# Patient Record
Sex: Female | Born: 1935 | Race: Black or African American | Hispanic: No | Smoking: Never smoker
Health system: Southern US, Community
[De-identification: ages and names within clinical notes are randomized; demographics above are authoritative.]

## PROBLEM LIST (undated history)

## (undated) DIAGNOSIS — C50911 Malignant neoplasm of unspecified site of right female breast: Secondary | ICD-10-CM

## (undated) DIAGNOSIS — I429 Cardiomyopathy, unspecified: Secondary | ICD-10-CM

## (undated) DIAGNOSIS — I10 Essential (primary) hypertension: Secondary | ICD-10-CM

## (undated) DIAGNOSIS — R0602 Shortness of breath: Secondary | ICD-10-CM

## (undated) DIAGNOSIS — E785 Hyperlipidemia, unspecified: Secondary | ICD-10-CM

## (undated) DIAGNOSIS — K5903 Drug induced constipation: Secondary | ICD-10-CM

## (undated) DIAGNOSIS — G4733 Obstructive sleep apnea (adult) (pediatric): Secondary | ICD-10-CM

## (undated) DIAGNOSIS — Z9989 Dependence on other enabling machines and devices: Secondary | ICD-10-CM

## (undated) DIAGNOSIS — K219 Gastro-esophageal reflux disease without esophagitis: Secondary | ICD-10-CM

## (undated) DIAGNOSIS — I509 Heart failure, unspecified: Secondary | ICD-10-CM

## (undated) DIAGNOSIS — H2702 Aphakia, left eye: Secondary | ICD-10-CM

## (undated) DIAGNOSIS — I2510 Atherosclerotic heart disease of native coronary artery without angina pectoris: Secondary | ICD-10-CM

## (undated) DIAGNOSIS — K5730 Diverticulosis of large intestine without perforation or abscess without bleeding: Secondary | ICD-10-CM

## (undated) DIAGNOSIS — E119 Type 2 diabetes mellitus without complications: Secondary | ICD-10-CM

## (undated) DIAGNOSIS — M1990 Unspecified osteoarthritis, unspecified site: Secondary | ICD-10-CM

## (undated) DIAGNOSIS — G8929 Other chronic pain: Secondary | ICD-10-CM

## (undated) DIAGNOSIS — K317 Polyp of stomach and duodenum: Secondary | ICD-10-CM

## (undated) DIAGNOSIS — R519 Headache, unspecified: Secondary | ICD-10-CM

## (undated) DIAGNOSIS — R079 Chest pain, unspecified: Secondary | ICD-10-CM

## (undated) DIAGNOSIS — N200 Calculus of kidney: Secondary | ICD-10-CM

## (undated) DIAGNOSIS — E669 Obesity, unspecified: Secondary | ICD-10-CM

## (undated) DIAGNOSIS — Z8719 Personal history of other diseases of the digestive system: Secondary | ICD-10-CM

## (undated) DIAGNOSIS — J42 Unspecified chronic bronchitis: Secondary | ICD-10-CM

## (undated) DIAGNOSIS — H269 Unspecified cataract: Secondary | ICD-10-CM

## (undated) DIAGNOSIS — D509 Iron deficiency anemia, unspecified: Secondary | ICD-10-CM

## (undated) DIAGNOSIS — F419 Anxiety disorder, unspecified: Secondary | ICD-10-CM

## (undated) DIAGNOSIS — I219 Acute myocardial infarction, unspecified: Secondary | ICD-10-CM

## (undated) DIAGNOSIS — I4891 Unspecified atrial fibrillation: Secondary | ICD-10-CM

## (undated) DIAGNOSIS — M549 Dorsalgia, unspecified: Secondary | ICD-10-CM

## (undated) DIAGNOSIS — R001 Bradycardia, unspecified: Secondary | ICD-10-CM

## (undated) DIAGNOSIS — F0390 Unspecified dementia without behavioral disturbance: Secondary | ICD-10-CM

## (undated) DIAGNOSIS — R51 Headache: Secondary | ICD-10-CM

## (undated) HISTORY — PX: DILATION AND CURETTAGE OF UTERUS: SHX78

## (undated) HISTORY — PX: BREAST BIOPSY: SHX20

## (undated) HISTORY — DX: Unspecified osteoarthritis, unspecified site: M19.90

## (undated) HISTORY — DX: Obesity, unspecified: E66.9

## (undated) HISTORY — DX: Acute myocardial infarction, unspecified: I21.9

## (undated) HISTORY — DX: Aphakia, left eye: H27.02

## (undated) HISTORY — PX: TOTAL ABDOMINAL HYSTERECTOMY: SHX209

## (undated) HISTORY — DX: Calculus of kidney: N20.0

## (undated) HISTORY — PX: EUS: SHX5427

## (undated) HISTORY — PX: COLONOSCOPY: SHX174

## (undated) HISTORY — PX: GLAUCOMA SURGERY: SHX656

## (undated) HISTORY — PX: CATARACT EXTRACTION W/ INTRAOCULAR LENS  IMPLANT, BILATERAL: SHX1307

## (undated) HISTORY — PX: TONSILLECTOMY: SUR1361

## (undated) HISTORY — PX: CORONARY ANGIOPLASTY WITH STENT PLACEMENT: SHX49

## (undated) HISTORY — PX: FRACTURE SURGERY: SHX138

## (undated) HISTORY — DX: Diverticulosis of large intestine without perforation or abscess without bleeding: K57.30

## (undated) HISTORY — PX: ESOPHAGOGASTRODUODENOSCOPY: SHX1529

## (undated) HISTORY — DX: Polyp of stomach and duodenum: K31.7

## (undated) HISTORY — DX: Personal history of other diseases of the digestive system: Z87.19

## (undated) HISTORY — DX: Cardiomyopathy, unspecified: I42.9

## (undated) HISTORY — PX: EYE SURGERY: SHX253

## (undated) HISTORY — PX: BACK SURGERY: SHX140

## (undated) HISTORY — PX: APPENDECTOMY: SHX54

## (undated) HISTORY — PX: BREAST LUMPECTOMY: SHX2

---

## 1997-09-30 ENCOUNTER — Other Ambulatory Visit: Admission: RE | Admit: 1997-09-30 | Discharge: 1997-09-30 | Payer: Self-pay | Admitting: *Deleted

## 1997-09-30 ENCOUNTER — Encounter: Admission: RE | Admit: 1997-09-30 | Discharge: 1997-09-30 | Payer: Self-pay | Admitting: Hematology and Oncology

## 1997-10-07 ENCOUNTER — Ambulatory Visit (HOSPITAL_COMMUNITY): Admission: RE | Admit: 1997-10-07 | Discharge: 1997-10-07 | Payer: Self-pay | Admitting: *Deleted

## 1997-12-10 ENCOUNTER — Emergency Department (HOSPITAL_COMMUNITY): Admission: EM | Admit: 1997-12-10 | Discharge: 1997-12-10 | Payer: Self-pay | Admitting: Emergency Medicine

## 1997-12-11 ENCOUNTER — Inpatient Hospital Stay (HOSPITAL_COMMUNITY): Admission: AD | Admit: 1997-12-11 | Discharge: 1997-12-16 | Payer: Self-pay | Admitting: *Deleted

## 1998-07-16 ENCOUNTER — Emergency Department (HOSPITAL_COMMUNITY): Admission: EM | Admit: 1998-07-16 | Discharge: 1998-07-17 | Payer: Self-pay | Admitting: Emergency Medicine

## 1998-07-16 ENCOUNTER — Encounter: Payer: Self-pay | Admitting: Emergency Medicine

## 1998-07-31 ENCOUNTER — Emergency Department (HOSPITAL_COMMUNITY): Admission: EM | Admit: 1998-07-31 | Discharge: 1998-07-31 | Payer: Self-pay | Admitting: Emergency Medicine

## 1998-07-31 ENCOUNTER — Encounter: Payer: Self-pay | Admitting: Emergency Medicine

## 1998-09-11 ENCOUNTER — Encounter: Payer: Self-pay | Admitting: *Deleted

## 1998-09-11 ENCOUNTER — Ambulatory Visit (HOSPITAL_COMMUNITY): Admission: RE | Admit: 1998-09-11 | Discharge: 1998-09-11 | Payer: Self-pay | Admitting: *Deleted

## 1998-10-27 ENCOUNTER — Ambulatory Visit (HOSPITAL_COMMUNITY): Admission: RE | Admit: 1998-10-27 | Discharge: 1998-10-27 | Payer: Self-pay | Admitting: *Deleted

## 1998-10-27 ENCOUNTER — Encounter: Payer: Self-pay | Admitting: *Deleted

## 1999-03-23 ENCOUNTER — Emergency Department (HOSPITAL_COMMUNITY): Admission: EM | Admit: 1999-03-23 | Discharge: 1999-03-23 | Payer: Self-pay | Admitting: Emergency Medicine

## 1999-07-10 ENCOUNTER — Ambulatory Visit (HOSPITAL_COMMUNITY): Admission: RE | Admit: 1999-07-10 | Discharge: 1999-07-10 | Payer: Self-pay | Admitting: *Deleted

## 2000-04-08 ENCOUNTER — Emergency Department (HOSPITAL_COMMUNITY): Admission: EM | Admit: 2000-04-08 | Discharge: 2000-04-08 | Payer: Self-pay | Admitting: Emergency Medicine

## 2000-04-08 ENCOUNTER — Encounter: Payer: Self-pay | Admitting: *Deleted

## 2000-05-13 ENCOUNTER — Emergency Department (HOSPITAL_COMMUNITY): Admission: EM | Admit: 2000-05-13 | Discharge: 2000-05-14 | Payer: Self-pay | Admitting: Emergency Medicine

## 2000-05-14 ENCOUNTER — Encounter: Payer: Self-pay | Admitting: Emergency Medicine

## 2000-05-17 ENCOUNTER — Encounter: Payer: Self-pay | Admitting: Emergency Medicine

## 2000-05-17 ENCOUNTER — Emergency Department (HOSPITAL_COMMUNITY): Admission: EM | Admit: 2000-05-17 | Discharge: 2000-05-17 | Payer: Self-pay | Admitting: Emergency Medicine

## 2000-10-12 ENCOUNTER — Encounter: Admission: RE | Admit: 2000-10-12 | Discharge: 2000-10-12 | Payer: Self-pay | Admitting: Hematology and Oncology

## 2000-12-15 ENCOUNTER — Encounter: Admission: RE | Admit: 2000-12-15 | Discharge: 2000-12-15 | Payer: Self-pay | Admitting: Internal Medicine

## 2001-06-06 ENCOUNTER — Encounter: Admission: RE | Admit: 2001-06-06 | Discharge: 2001-06-06 | Payer: Self-pay | Admitting: Urology

## 2001-06-06 ENCOUNTER — Encounter: Payer: Self-pay | Admitting: Urology

## 2001-06-22 ENCOUNTER — Emergency Department (HOSPITAL_COMMUNITY): Admission: EM | Admit: 2001-06-22 | Discharge: 2001-06-22 | Payer: Self-pay

## 2002-04-18 ENCOUNTER — Ambulatory Visit (HOSPITAL_COMMUNITY): Admission: RE | Admit: 2002-04-18 | Discharge: 2002-04-18 | Payer: Self-pay | Admitting: *Deleted

## 2002-04-22 ENCOUNTER — Emergency Department (HOSPITAL_COMMUNITY): Admission: EM | Admit: 2002-04-22 | Discharge: 2002-04-22 | Payer: Self-pay

## 2002-05-03 ENCOUNTER — Encounter: Admission: RE | Admit: 2002-05-03 | Discharge: 2002-05-03 | Payer: Self-pay | Admitting: Internal Medicine

## 2002-05-03 ENCOUNTER — Encounter: Payer: Self-pay | Admitting: Internal Medicine

## 2002-06-23 ENCOUNTER — Emergency Department (HOSPITAL_COMMUNITY): Admission: EM | Admit: 2002-06-23 | Discharge: 2002-06-23 | Payer: Self-pay | Admitting: Emergency Medicine

## 2002-08-22 ENCOUNTER — Emergency Department (HOSPITAL_COMMUNITY): Admission: EM | Admit: 2002-08-22 | Discharge: 2002-08-22 | Payer: Self-pay | Admitting: Emergency Medicine

## 2002-09-08 ENCOUNTER — Inpatient Hospital Stay (HOSPITAL_COMMUNITY): Admission: EM | Admit: 2002-09-08 | Discharge: 2002-09-12 | Payer: Self-pay | Admitting: Emergency Medicine

## 2002-09-08 ENCOUNTER — Encounter: Payer: Self-pay | Admitting: Emergency Medicine

## 2002-11-14 ENCOUNTER — Inpatient Hospital Stay (HOSPITAL_COMMUNITY): Admission: RE | Admit: 2002-11-14 | Discharge: 2002-11-17 | Payer: Self-pay | Admitting: Internal Medicine

## 2002-11-14 ENCOUNTER — Encounter: Admission: RE | Admit: 2002-11-14 | Discharge: 2002-11-14 | Payer: Self-pay | Admitting: Internal Medicine

## 2002-11-15 ENCOUNTER — Encounter: Payer: Self-pay | Admitting: Cardiology

## 2002-11-25 ENCOUNTER — Encounter: Payer: Self-pay | Admitting: Emergency Medicine

## 2002-11-25 ENCOUNTER — Emergency Department (HOSPITAL_COMMUNITY): Admission: EM | Admit: 2002-11-25 | Discharge: 2002-11-25 | Payer: Self-pay | Admitting: Emergency Medicine

## 2002-12-15 ENCOUNTER — Emergency Department (HOSPITAL_COMMUNITY): Admission: EM | Admit: 2002-12-15 | Discharge: 2002-12-15 | Payer: Self-pay

## 2002-12-31 ENCOUNTER — Emergency Department (HOSPITAL_COMMUNITY): Admission: EM | Admit: 2002-12-31 | Discharge: 2002-12-31 | Payer: Self-pay | Admitting: Emergency Medicine

## 2003-04-01 ENCOUNTER — Encounter: Payer: Self-pay | Admitting: Emergency Medicine

## 2003-04-01 ENCOUNTER — Emergency Department (HOSPITAL_COMMUNITY): Admission: EM | Admit: 2003-04-01 | Discharge: 2003-04-01 | Payer: Self-pay | Admitting: Emergency Medicine

## 2003-04-19 ENCOUNTER — Emergency Department (HOSPITAL_COMMUNITY): Admission: EM | Admit: 2003-04-19 | Discharge: 2003-04-19 | Payer: Self-pay | Admitting: Emergency Medicine

## 2003-04-19 ENCOUNTER — Encounter: Payer: Self-pay | Admitting: Emergency Medicine

## 2003-06-01 ENCOUNTER — Emergency Department (HOSPITAL_COMMUNITY): Admission: EM | Admit: 2003-06-01 | Discharge: 2003-06-01 | Payer: Self-pay | Admitting: Emergency Medicine

## 2003-07-12 ENCOUNTER — Encounter (INDEPENDENT_AMBULATORY_CARE_PROVIDER_SITE_OTHER): Payer: Self-pay | Admitting: *Deleted

## 2003-07-12 ENCOUNTER — Ambulatory Visit (HOSPITAL_COMMUNITY): Admission: RE | Admit: 2003-07-12 | Discharge: 2003-07-12 | Payer: Self-pay | Admitting: *Deleted

## 2003-07-15 ENCOUNTER — Encounter: Admission: RE | Admit: 2003-07-15 | Discharge: 2003-07-15 | Payer: Self-pay | Admitting: Internal Medicine

## 2004-03-04 ENCOUNTER — Emergency Department (HOSPITAL_COMMUNITY): Admission: EM | Admit: 2004-03-04 | Discharge: 2004-03-04 | Payer: Self-pay | Admitting: Emergency Medicine

## 2004-03-07 ENCOUNTER — Emergency Department (HOSPITAL_COMMUNITY): Admission: EM | Admit: 2004-03-07 | Discharge: 2004-03-07 | Payer: Self-pay | Admitting: *Deleted

## 2004-03-07 HISTORY — PX: CORONARY ANGIOPLASTY: SHX604

## 2004-06-05 HISTORY — PX: CARDIAC CATHETERIZATION: SHX172

## 2004-06-15 ENCOUNTER — Encounter: Admission: RE | Admit: 2004-06-15 | Discharge: 2004-06-15 | Payer: Self-pay | Admitting: Cardiovascular Disease

## 2004-06-18 ENCOUNTER — Ambulatory Visit (HOSPITAL_COMMUNITY): Admission: RE | Admit: 2004-06-18 | Discharge: 2004-06-19 | Payer: Self-pay | Admitting: Cardiovascular Disease

## 2004-06-26 ENCOUNTER — Emergency Department (HOSPITAL_COMMUNITY): Admission: EM | Admit: 2004-06-26 | Discharge: 2004-06-26 | Payer: Self-pay | Admitting: Emergency Medicine

## 2004-08-08 ENCOUNTER — Emergency Department (HOSPITAL_COMMUNITY): Admission: EM | Admit: 2004-08-08 | Discharge: 2004-08-08 | Payer: Self-pay | Admitting: Emergency Medicine

## 2004-12-01 ENCOUNTER — Emergency Department (HOSPITAL_COMMUNITY): Admission: EM | Admit: 2004-12-01 | Discharge: 2004-12-01 | Payer: Self-pay | Admitting: Emergency Medicine

## 2005-04-16 ENCOUNTER — Inpatient Hospital Stay (HOSPITAL_COMMUNITY): Admission: EM | Admit: 2005-04-16 | Discharge: 2005-04-20 | Payer: Self-pay | Admitting: Emergency Medicine

## 2005-04-19 HISTORY — PX: CARDIAC CATHETERIZATION: SHX172

## 2005-04-28 ENCOUNTER — Inpatient Hospital Stay (HOSPITAL_COMMUNITY): Admission: EM | Admit: 2005-04-28 | Discharge: 2005-05-03 | Payer: Self-pay | Admitting: Emergency Medicine

## 2005-05-21 ENCOUNTER — Inpatient Hospital Stay (HOSPITAL_COMMUNITY): Admission: EM | Admit: 2005-05-21 | Discharge: 2005-05-25 | Payer: Self-pay | Admitting: Emergency Medicine

## 2005-05-21 ENCOUNTER — Ambulatory Visit: Payer: Self-pay | Admitting: Internal Medicine

## 2005-08-19 ENCOUNTER — Ambulatory Visit (HOSPITAL_COMMUNITY): Admission: RE | Admit: 2005-08-19 | Discharge: 2005-08-19 | Payer: Self-pay | Admitting: *Deleted

## 2005-08-19 ENCOUNTER — Encounter (INDEPENDENT_AMBULATORY_CARE_PROVIDER_SITE_OTHER): Payer: Self-pay | Admitting: Specialist

## 2005-10-01 ENCOUNTER — Emergency Department (HOSPITAL_COMMUNITY): Admission: EM | Admit: 2005-10-01 | Discharge: 2005-10-01 | Payer: Self-pay | Admitting: Emergency Medicine

## 2005-10-10 ENCOUNTER — Emergency Department (HOSPITAL_COMMUNITY): Admission: EM | Admit: 2005-10-10 | Discharge: 2005-10-11 | Payer: Self-pay | Admitting: Emergency Medicine

## 2005-10-14 ENCOUNTER — Emergency Department (HOSPITAL_COMMUNITY): Admission: EM | Admit: 2005-10-14 | Discharge: 2005-10-14 | Payer: Self-pay | Admitting: Emergency Medicine

## 2005-10-28 ENCOUNTER — Ambulatory Visit (HOSPITAL_COMMUNITY): Admission: RE | Admit: 2005-10-28 | Discharge: 2005-10-28 | Payer: Self-pay | Admitting: Gastroenterology

## 2005-10-28 ENCOUNTER — Encounter (INDEPENDENT_AMBULATORY_CARE_PROVIDER_SITE_OTHER): Payer: Self-pay | Admitting: Specialist

## 2005-11-02 ENCOUNTER — Encounter: Admission: RE | Admit: 2005-11-02 | Discharge: 2005-11-02 | Payer: Self-pay | Admitting: Internal Medicine

## 2005-11-03 ENCOUNTER — Ambulatory Visit: Payer: Self-pay | Admitting: Gastroenterology

## 2005-11-28 ENCOUNTER — Emergency Department (HOSPITAL_COMMUNITY): Admission: EM | Admit: 2005-11-28 | Discharge: 2005-11-28 | Payer: Self-pay | Admitting: Emergency Medicine

## 2006-06-01 ENCOUNTER — Observation Stay (HOSPITAL_COMMUNITY): Admission: EM | Admit: 2006-06-01 | Discharge: 2006-06-03 | Payer: Self-pay | Admitting: Emergency Medicine

## 2006-09-18 ENCOUNTER — Emergency Department (HOSPITAL_COMMUNITY): Admission: EM | Admit: 2006-09-18 | Discharge: 2006-09-18 | Payer: Self-pay | Admitting: Emergency Medicine

## 2006-10-13 ENCOUNTER — Emergency Department (HOSPITAL_COMMUNITY): Admission: EM | Admit: 2006-10-13 | Discharge: 2006-10-13 | Payer: Self-pay | Admitting: Emergency Medicine

## 2006-11-07 ENCOUNTER — Encounter: Admission: RE | Admit: 2006-11-07 | Discharge: 2006-11-07 | Payer: Self-pay | Admitting: Internal Medicine

## 2006-11-11 ENCOUNTER — Ambulatory Visit (HOSPITAL_COMMUNITY): Admission: RE | Admit: 2006-11-11 | Discharge: 2006-11-11 | Payer: Self-pay | Admitting: *Deleted

## 2006-11-11 ENCOUNTER — Encounter (INDEPENDENT_AMBULATORY_CARE_PROVIDER_SITE_OTHER): Payer: Self-pay | Admitting: *Deleted

## 2006-11-11 DIAGNOSIS — K317 Polyp of stomach and duodenum: Secondary | ICD-10-CM

## 2006-11-11 HISTORY — DX: Polyp of stomach and duodenum: K31.7

## 2006-11-23 ENCOUNTER — Emergency Department (HOSPITAL_COMMUNITY): Admission: EM | Admit: 2006-11-23 | Discharge: 2006-11-23 | Payer: Self-pay | Admitting: Emergency Medicine

## 2006-11-28 ENCOUNTER — Inpatient Hospital Stay (HOSPITAL_COMMUNITY): Admission: EM | Admit: 2006-11-28 | Discharge: 2006-11-29 | Payer: Self-pay | Admitting: Emergency Medicine

## 2007-07-01 ENCOUNTER — Emergency Department (HOSPITAL_COMMUNITY): Admission: EM | Admit: 2007-07-01 | Discharge: 2007-07-01 | Payer: Self-pay | Admitting: *Deleted

## 2008-01-25 HISTORY — PX: OTHER SURGICAL HISTORY: SHX169

## 2008-01-25 HISTORY — PX: US ECHOCARDIOGRAPHY: HXRAD669

## 2008-05-16 ENCOUNTER — Ambulatory Visit (HOSPITAL_COMMUNITY): Admission: RE | Admit: 2008-05-16 | Discharge: 2008-05-16 | Payer: Self-pay | Admitting: Ophthalmology

## 2008-08-24 ENCOUNTER — Emergency Department (HOSPITAL_COMMUNITY): Admission: EM | Admit: 2008-08-24 | Discharge: 2008-08-24 | Payer: Self-pay | Admitting: Emergency Medicine

## 2008-08-28 ENCOUNTER — Emergency Department (HOSPITAL_COMMUNITY): Admission: EM | Admit: 2008-08-28 | Discharge: 2008-08-28 | Payer: Self-pay | Admitting: Emergency Medicine

## 2008-09-17 ENCOUNTER — Inpatient Hospital Stay (HOSPITAL_COMMUNITY): Admission: EM | Admit: 2008-09-17 | Discharge: 2008-09-24 | Payer: Self-pay | Admitting: Emergency Medicine

## 2008-11-25 ENCOUNTER — Emergency Department (HOSPITAL_COMMUNITY): Admission: EM | Admit: 2008-11-25 | Discharge: 2008-11-25 | Payer: Self-pay | Admitting: Emergency Medicine

## 2008-11-28 ENCOUNTER — Encounter: Admission: RE | Admit: 2008-11-28 | Discharge: 2008-11-28 | Payer: Self-pay | Admitting: Internal Medicine

## 2008-12-24 ENCOUNTER — Encounter: Admission: RE | Admit: 2008-12-24 | Discharge: 2008-12-24 | Payer: Self-pay | Admitting: Orthopedic Surgery

## 2008-12-26 ENCOUNTER — Emergency Department (HOSPITAL_COMMUNITY): Admission: EM | Admit: 2008-12-26 | Discharge: 2008-12-26 | Payer: Self-pay | Admitting: Emergency Medicine

## 2009-07-31 ENCOUNTER — Ambulatory Visit (HOSPITAL_COMMUNITY): Admission: RE | Admit: 2009-07-31 | Discharge: 2009-07-31 | Payer: Self-pay | Admitting: Ophthalmology

## 2009-10-13 ENCOUNTER — Ambulatory Visit (HOSPITAL_COMMUNITY): Admission: RE | Admit: 2009-10-13 | Discharge: 2009-10-13 | Payer: Self-pay | Admitting: Ophthalmology

## 2010-03-26 ENCOUNTER — Emergency Department (HOSPITAL_COMMUNITY): Admission: EM | Admit: 2010-03-26 | Discharge: 2010-03-26 | Payer: Self-pay | Admitting: Emergency Medicine

## 2010-06-17 ENCOUNTER — Emergency Department (HOSPITAL_COMMUNITY)
Admission: EM | Admit: 2010-06-17 | Discharge: 2010-06-17 | Payer: Self-pay | Source: Home / Self Care | Admitting: Emergency Medicine

## 2010-07-19 ENCOUNTER — Encounter: Payer: Self-pay | Admitting: Geriatric Medicine

## 2010-08-25 IMAGING — CR DG CHEST 2V
1 series · 1 of 1 positions shown · non-contrast
Comparison: 06/01/2006

CLINICAL DATA: Chest pain.

CHEST - 2 VIEW

[w chest lat *]
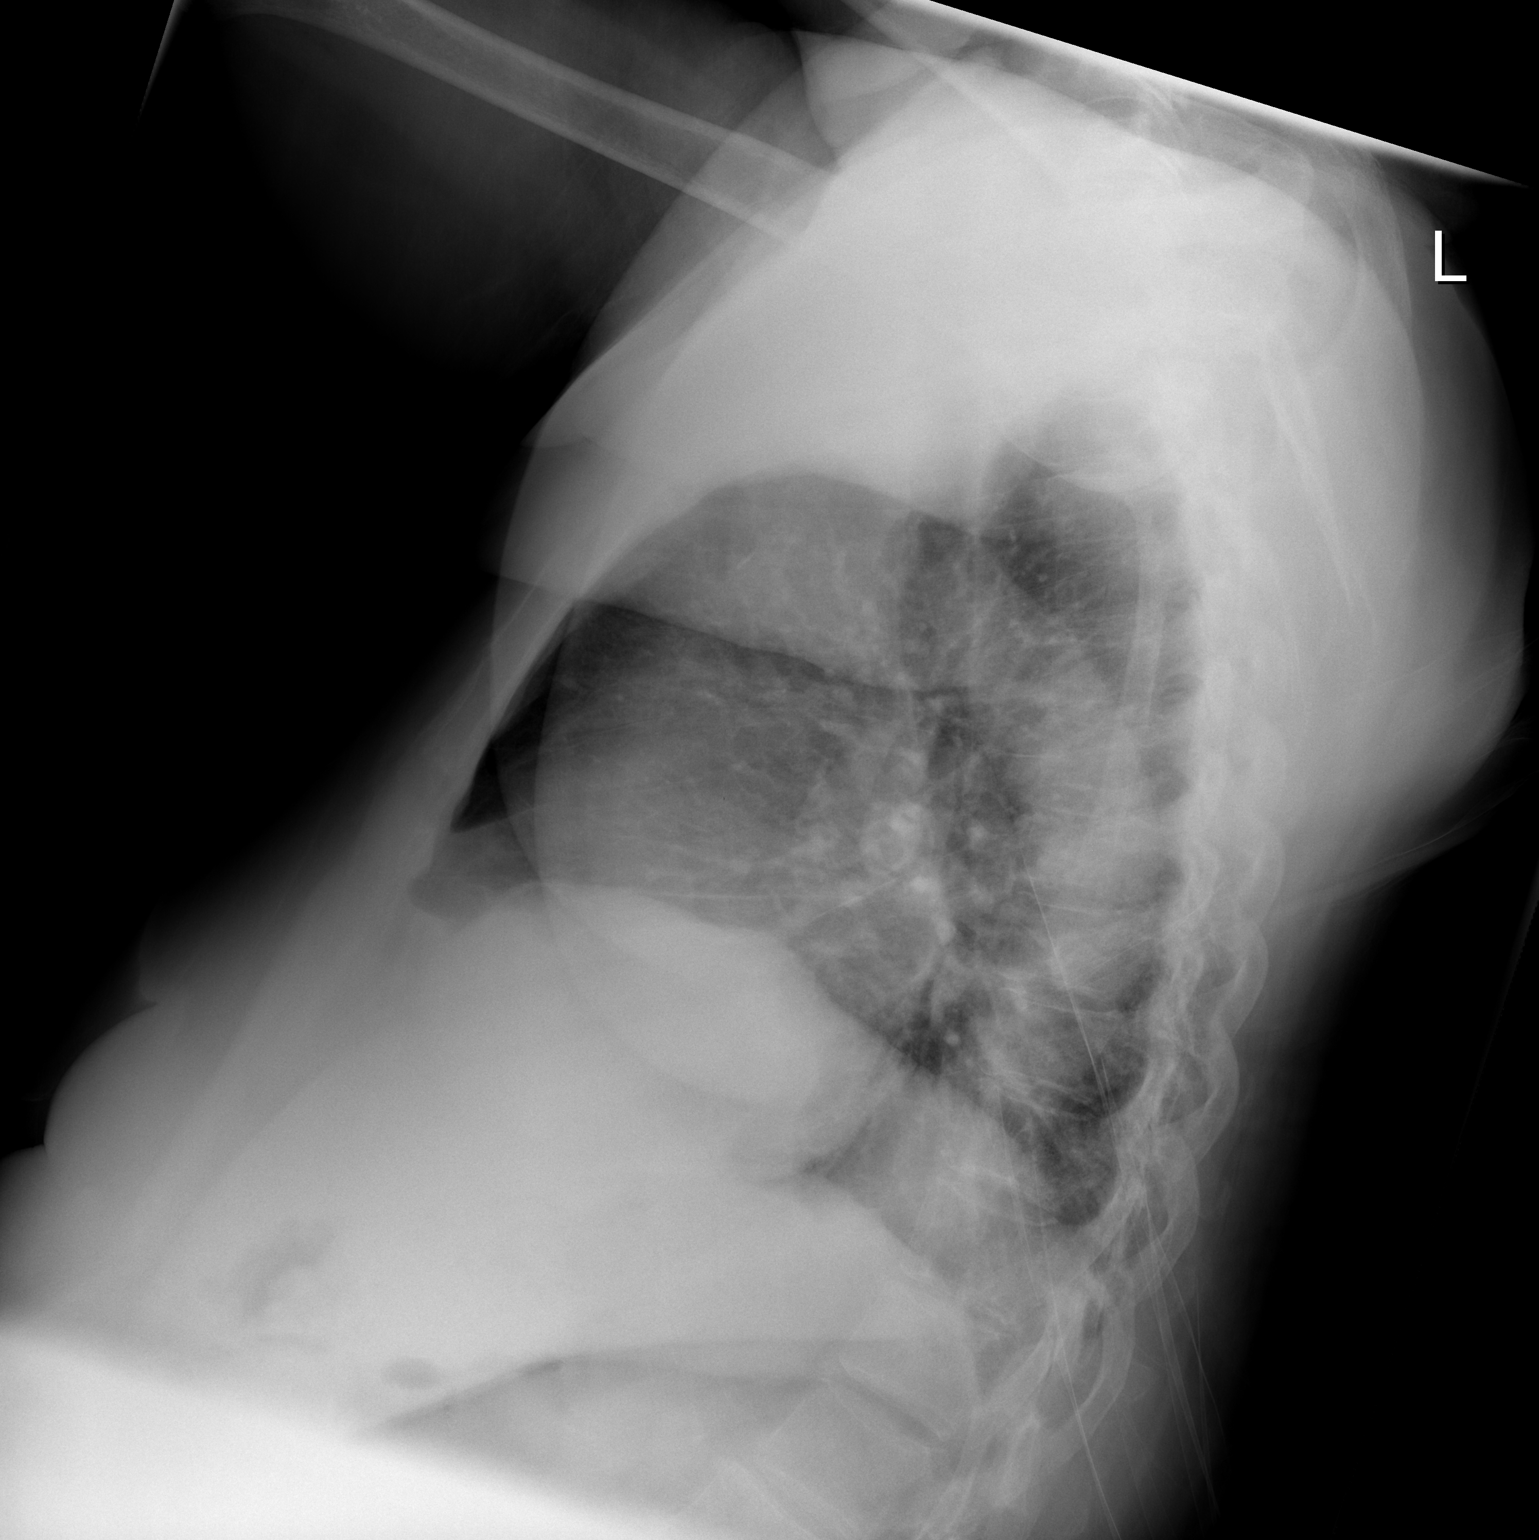

[1 of 1 positions shown; findings below may reference images not displayed]

FINDINGS: The heart is mildly enlarged but stable.  There is
tortuosity and ectasia of the thoracic aorta.  There are chronic
lung changes/COPD but no definite acute overlying pulmonary
process.  Right paratracheal density is stable and may reflect
ectatic vasculature.  The bony thorax is intact.
IMPRESSION: Chronic lung changes but no acute pulmonary findings.
Mild stable cardiac enlargement.

## 2010-09-07 LAB — CBC
Platelets: 101 10*3/uL — ABNORMAL LOW (ref 150–400)
RBC: 4.55 MIL/uL (ref 3.87–5.11)
WBC: 5.7 10*3/uL (ref 4.0–10.5)

## 2010-09-07 LAB — URINALYSIS, ROUTINE W REFLEX MICROSCOPIC
Nitrite: NEGATIVE
Specific Gravity, Urine: 1.011 (ref 1.005–1.030)
Urobilinogen, UA: 0.2 mg/dL (ref 0.0–1.0)

## 2010-09-07 LAB — URINE CULTURE

## 2010-09-07 LAB — COMPREHENSIVE METABOLIC PANEL
AST: 22 U/L (ref 0–37)
Albumin: 3.9 g/dL (ref 3.5–5.2)
Chloride: 107 mEq/L (ref 96–112)
Creatinine, Ser: 1.16 mg/dL (ref 0.4–1.2)
GFR calc Af Amer: 54 mL/min — ABNORMAL LOW (ref >60)
Potassium: 3.3 mEq/L — ABNORMAL LOW (ref 3.5–5.1)
Total Bilirubin: 0.4 mg/dL (ref 0.3–1.2)

## 2010-09-07 LAB — URINE MICROSCOPIC-ADD ON

## 2010-09-07 LAB — DIFFERENTIAL
Basophils Relative: 1 % (ref 0–1)
Lymphocytes Relative: 29 % (ref 12–46)
Monocytes Absolute: 0.6 10*3/uL (ref 0.1–1.0)
Neutrophils Relative %: 55 % (ref 43–77)

## 2010-09-10 LAB — BASIC METABOLIC PANEL
BUN: 20 mg/dL (ref 6–23)
CO2: 24 mEq/L (ref 19–32)
Chloride: 108 mEq/L (ref 96–112)
GFR calc non Af Amer: 54 mL/min — ABNORMAL LOW (ref >60)
Glucose, Bld: 133 mg/dL — ABNORMAL HIGH (ref 70–99)
Potassium: 3.3 mEq/L — ABNORMAL LOW (ref 3.5–5.1)

## 2010-09-10 LAB — CBC
MCV: 88.7 fL (ref 78.0–100.0)
Platelets: 218 10*3/uL (ref 150–400)
RBC: 4.61 MIL/uL (ref 3.87–5.11)
WBC: 6.1 10*3/uL (ref 4.0–10.5)

## 2010-09-15 LAB — CBC
HCT: 40 % (ref 36.0–46.0)
Hemoglobin: 13.6 g/dL (ref 12.0–15.0)
RDW: 13.7 % (ref 11.5–15.5)
WBC: 6.2 10*3/uL (ref 4.0–10.5)

## 2010-09-15 LAB — BASIC METABOLIC PANEL
GFR calc non Af Amer: 55 mL/min — ABNORMAL LOW (ref >60)
Glucose, Bld: 103 mg/dL — ABNORMAL HIGH (ref 70–99)
Potassium: 4 mEq/L (ref 3.5–5.1)
Sodium: 137 mEq/L (ref 135–145)

## 2010-09-15 LAB — GLUCOSE, CAPILLARY
Glucose-Capillary: 135 mg/dL — ABNORMAL HIGH (ref 70–99)
Glucose-Capillary: 99 mg/dL (ref 70–99)

## 2010-09-15 LAB — APTT: aPTT: 27 seconds (ref 24–37)

## 2010-09-16 LAB — COMPREHENSIVE METABOLIC PANEL
AST: 19 U/L (ref 0–37)
Albumin: 4.1 g/dL (ref 3.5–5.2)
Alkaline Phosphatase: 25 U/L — ABNORMAL LOW (ref 39–117)
Chloride: 107 mEq/L (ref 96–112)
GFR calc Af Amer: 58 mL/min — ABNORMAL LOW (ref >60)
Potassium: 3.6 mEq/L (ref 3.5–5.1)
Total Bilirubin: 0.5 mg/dL (ref 0.3–1.2)

## 2010-09-16 LAB — URINALYSIS, ROUTINE W REFLEX MICROSCOPIC
Bilirubin Urine: NEGATIVE
Hgb urine dipstick: NEGATIVE
Ketones, ur: NEGATIVE mg/dL
Nitrite: NEGATIVE
pH: 7 (ref 5.0–8.0)

## 2010-09-16 LAB — DIFFERENTIAL
Basophils Absolute: 0 10*3/uL (ref 0.0–0.1)
Basophils Relative: 1 % (ref 0–1)
Eosinophils Relative: 4 % (ref 0–5)
Lymphocytes Relative: 35 % (ref 12–46)
Monocytes Absolute: 0.4 10*3/uL (ref 0.1–1.0)

## 2010-09-16 LAB — CBC
Platelets: 211 10*3/uL (ref 150–400)
WBC: 5.2 10*3/uL (ref 4.0–10.5)

## 2010-10-04 LAB — CBC
HCT: 37.4 % (ref 36.0–46.0)
Hemoglobin: 12.6 g/dL (ref 12.0–15.0)
MCV: 90.8 fL (ref 78.0–100.0)
Platelets: 186 10*3/uL (ref 150–400)
WBC: 3.7 10*3/uL — ABNORMAL LOW (ref 4.0–10.5)

## 2010-10-04 LAB — BRAIN NATRIURETIC PEPTIDE: Pro B Natriuretic peptide (BNP): 30 pg/mL (ref 0.0–100.0)

## 2010-10-04 LAB — DIFFERENTIAL
Basophils Absolute: 0 10*3/uL (ref 0.0–0.1)
Basophils Relative: 1 % (ref 0–1)
Lymphocytes Relative: 40 % (ref 12–46)
Neutro Abs: 1.6 10*3/uL — ABNORMAL LOW (ref 1.7–7.7)
Neutrophils Relative %: 43 % (ref 43–77)

## 2010-10-04 LAB — COMPREHENSIVE METABOLIC PANEL
Albumin: 3.9 g/dL (ref 3.5–5.2)
Alkaline Phosphatase: 24 U/L — ABNORMAL LOW (ref 39–117)
BUN: 11 mg/dL (ref 6–23)
CO2: 24 mEq/L (ref 19–32)
Chloride: 104 mEq/L (ref 96–112)
Creatinine, Ser: 1.08 mg/dL (ref 0.4–1.2)
GFR calc non Af Amer: 49 mL/min — ABNORMAL LOW (ref >60)
Glucose, Bld: 96 mg/dL (ref 70–99)
Total Bilirubin: 0.5 mg/dL (ref 0.3–1.2)

## 2010-10-04 LAB — POCT CARDIAC MARKERS
CKMB, poc: <1 ng/mL — ABNORMAL LOW (ref 1.0–8.0)
Myoglobin, poc: 123 ng/mL (ref 12–200)

## 2010-10-06 LAB — DIFFERENTIAL
Eosinophils Absolute: 0.3 10*3/uL (ref 0.0–0.7)
Lymphocytes Relative: 34 % (ref 12–46)
Lymphs Abs: 2 10*3/uL (ref 0.7–4.0)
Monocytes Relative: 9 % (ref 3–12)
Neutrophils Relative %: 51 % (ref 43–77)

## 2010-10-06 LAB — URINALYSIS, ROUTINE W REFLEX MICROSCOPIC
Nitrite: NEGATIVE
Specific Gravity, Urine: 1.003 — ABNORMAL LOW (ref 1.005–1.030)
Urobilinogen, UA: 0.2 mg/dL (ref 0.0–1.0)
pH: 7 (ref 5.0–8.0)

## 2010-10-06 LAB — URINE CULTURE: Colony Count: 25000

## 2010-10-06 LAB — POCT I-STAT, CHEM 8
BUN: 13 mg/dL (ref 6–23)
Chloride: 108 mEq/L (ref 96–112)
HCT: 41 % (ref 36.0–46.0)
Potassium: 3.8 mEq/L (ref 3.5–5.1)

## 2010-10-06 LAB — COMPREHENSIVE METABOLIC PANEL
ALT: 19 U/L (ref 0–35)
AST: 26 U/L (ref 0–37)
CO2: 24 mEq/L (ref 19–32)
Calcium: 9.6 mg/dL (ref 8.4–10.5)
Creatinine, Ser: 1.02 mg/dL (ref 0.4–1.2)
GFR calc Af Amer: >60 mL/min (ref >60)
GFR calc non Af Amer: 52 mL/min — ABNORMAL LOW (ref >60)
Glucose, Bld: 97 mg/dL (ref 70–99)
Sodium: 143 mEq/L (ref 135–145)
Total Protein: 7 g/dL (ref 6.0–8.3)

## 2010-10-06 LAB — LIPASE, BLOOD: Lipase: 34 U/L (ref 11–59)

## 2010-10-06 LAB — LACTIC ACID, PLASMA: Lactic Acid, Venous: 1.1 mmol/L (ref 0.5–2.2)

## 2010-10-06 LAB — CBC
MCHC: 31.2 g/dL (ref 30.0–36.0)
MCV: 91.2 fL (ref 78.0–100.0)
RDW: 13.7 % (ref 11.5–15.5)

## 2010-10-08 LAB — BASIC METABOLIC PANEL
BUN: 18 mg/dL (ref 6–23)
CO2: 24 mEq/L (ref 19–32)
CO2: 25 mEq/L (ref 19–32)
Calcium: 9.2 mg/dL (ref 8.4–10.5)
Chloride: 104 mEq/L (ref 96–112)
Chloride: 107 mEq/L (ref 96–112)
Chloride: 97 mEq/L (ref 96–112)
Creatinine, Ser: 1.6 mg/dL — ABNORMAL HIGH (ref 0.4–1.2)
GFR calc Af Amer: 19 mL/min — ABNORMAL LOW (ref >60)
GFR calc Af Amer: 20 mL/min — ABNORMAL LOW (ref >60)
GFR calc Af Amer: 25 mL/min — ABNORMAL LOW (ref >60)
GFR calc non Af Amer: 16 mL/min — ABNORMAL LOW (ref >60)
Glucose, Bld: 101 mg/dL — ABNORMAL HIGH (ref 70–99)
Glucose, Bld: 115 mg/dL — ABNORMAL HIGH (ref 70–99)
Potassium: 4.4 mEq/L (ref 3.5–5.1)
Potassium: 4.6 mEq/L (ref 3.5–5.1)
Potassium: 4.9 mEq/L (ref 3.5–5.1)
Sodium: 129 mEq/L — ABNORMAL LOW (ref 135–145)
Sodium: 132 mEq/L — ABNORMAL LOW (ref 135–145)
Sodium: 133 mEq/L — ABNORMAL LOW (ref 135–145)

## 2010-10-08 LAB — RENAL FUNCTION PANEL
Albumin: 3.4 g/dL — ABNORMAL LOW (ref 3.5–5.2)
BUN: 21 mg/dL (ref 6–23)
BUN: 30 mg/dL — ABNORMAL HIGH (ref 6–23)
CO2: 24 mEq/L (ref 19–32)
CO2: 24 mEq/L (ref 19–32)
Calcium: 8.9 mg/dL (ref 8.4–10.5)
Calcium: 9.1 mg/dL (ref 8.4–10.5)
Chloride: 110 mEq/L (ref 96–112)
Creatinine, Ser: 1.55 mg/dL — ABNORMAL HIGH (ref 0.4–1.2)
GFR calc Af Amer: 39 mL/min — ABNORMAL LOW (ref >60)
GFR calc non Af Amer: 32 mL/min — ABNORMAL LOW (ref >60)
Glucose, Bld: 102 mg/dL — ABNORMAL HIGH (ref 70–99)
Glucose, Bld: 105 mg/dL — ABNORMAL HIGH (ref 70–99)
Glucose, Bld: 94 mg/dL (ref 70–99)
Phosphorus: 3 mg/dL (ref 2.3–4.6)
Phosphorus: 3.6 mg/dL (ref 2.3–4.6)
Potassium: 4.6 mEq/L (ref 3.5–5.1)

## 2010-10-08 LAB — COMPREHENSIVE METABOLIC PANEL
ALT: 13 U/L (ref 0–35)
Alkaline Phosphatase: 16 U/L — ABNORMAL LOW (ref 39–117)
Alkaline Phosphatase: 17 U/L — ABNORMAL LOW (ref 39–117)
BUN: 19 mg/dL (ref 6–23)
BUN: 63 mg/dL — ABNORMAL HIGH (ref 6–23)
BUN: 59 mg/dL — ABNORMAL HIGH (ref 6–23)
Calcium: 9.5 mg/dL (ref 8.4–10.5)
Chloride: 105 mEq/L (ref 96–112)
Glucose, Bld: 101 mg/dL — ABNORMAL HIGH (ref 70–99)
Glucose, Bld: 110 mg/dL — ABNORMAL HIGH (ref 70–99)
Glucose, Bld: 109 mg/dL — ABNORMAL HIGH (ref 70–99)
Potassium: 3.8 mEq/L (ref 3.5–5.1)
Potassium: 5.5 mEq/L — ABNORMAL HIGH (ref 3.5–5.1)
Sodium: 136 mEq/L (ref 135–145)
Total Bilirubin: 0.7 mg/dL (ref 0.3–1.2)
Total Protein: 5.8 g/dL — ABNORMAL LOW (ref 6.0–8.3)
Total Protein: 6.7 g/dL (ref 6.0–8.3)
Total Protein: 7.4 g/dL (ref 6.0–8.3)

## 2010-10-08 LAB — URINALYSIS, ROUTINE W REFLEX MICROSCOPIC
Bilirubin Urine: NEGATIVE
Bilirubin Urine: NEGATIVE
Glucose, UA: NEGATIVE mg/dL
Glucose, UA: NEGATIVE mg/dL
Hgb urine dipstick: NEGATIVE
Hgb urine dipstick: NEGATIVE
Hgb urine dipstick: NEGATIVE
Ketones, ur: NEGATIVE mg/dL
Ketones, ur: NEGATIVE mg/dL
Nitrite: NEGATIVE
Protein, ur: NEGATIVE mg/dL
Specific Gravity, Urine: 1.005 (ref 1.005–1.030)
Specific Gravity, Urine: 1.012 (ref 1.005–1.030)
Urobilinogen, UA: 0.2 mg/dL (ref 0.0–1.0)
Urobilinogen, UA: 0.2 mg/dL (ref 0.0–1.0)
pH: 5.5 (ref 5.0–8.0)
pH: 5.5 (ref 5.0–8.0)

## 2010-10-08 LAB — URINE CULTURE
Colony Count: 9000
Special Requests: NEGATIVE

## 2010-10-08 LAB — URINE MICROSCOPIC-ADD ON

## 2010-10-08 LAB — UIFE/LIGHT CHAINS/TP QN, 24-HR UR
Albumin, U: DETECTED
Alpha 1, Urine: NOT DETECTED
Beta, Urine: NOT DETECTED
Free Lambda Lt Chains,Ur: 0.07 mg/dL (ref 0.08–1.01)
Gamma Globulin, Urine: NOT DETECTED
Total Protein, Urine-Ur/day: 41 mg/d (ref 10–140)

## 2010-10-08 LAB — CBC
HCT: 27.9 % — ABNORMAL LOW (ref 36.0–46.0)
HCT: 28.5 % — ABNORMAL LOW (ref 36.0–46.0)
HCT: 31 % — ABNORMAL LOW (ref 36.0–46.0)
HCT: 31.7 % — ABNORMAL LOW (ref 36.0–46.0)
HCT: 35.3 % — ABNORMAL LOW (ref 36.0–46.0)
Hemoglobin: 10.5 g/dL — ABNORMAL LOW (ref 12.0–15.0)
Hemoglobin: 12.4 g/dL (ref 12.0–15.0)
Hemoglobin: 9.2 g/dL — ABNORMAL LOW (ref 12.0–15.0)
MCHC: 32.6 g/dL (ref 30.0–36.0)
MCHC: 33.2 g/dL (ref 30.0–36.0)
MCV: 90.7 fL (ref 78.0–100.0)
MCV: 91.2 fL (ref 78.0–100.0)
MCV: 91.4 fL (ref 78.0–100.0)
MCV: 92.3 fL (ref 78.0–100.0)
MCV: 91.3 fL (ref 78.0–100.0)
Platelets: 222 10*3/uL (ref 150–400)
Platelets: 227 10*3/uL (ref 150–400)
RBC: 3.02 MIL/uL — ABNORMAL LOW (ref 3.87–5.11)
RBC: 3.12 MIL/uL — ABNORMAL LOW (ref 3.87–5.11)
RBC: 3.36 MIL/uL — ABNORMAL LOW (ref 3.87–5.11)
RBC: 4.1 MIL/uL (ref 3.87–5.11)
RDW: 12.7 % (ref 11.5–15.5)
RDW: 13.1 % (ref 11.5–15.5)
RDW: 13.5 % (ref 11.5–15.5)
WBC: 6.4 10*3/uL (ref 4.0–10.5)
WBC: 6.4 10*3/uL (ref 4.0–10.5)
WBC: 7.4 10*3/uL (ref 4.0–10.5)

## 2010-10-08 LAB — CARDIAC PANEL(CRET KIN+CKTOT+MB+TROPI)
CK, MB: 3.1 ng/mL (ref 0.3–4.0)
CK, MB: 3.3 ng/mL (ref 0.3–4.0)
CK, MB: 3.9 ng/mL (ref 0.3–4.0)
Relative Index: 2.4 (ref 0.0–2.5)
Total CK: 140 U/L (ref 7–177)
Total CK: 145 U/L (ref 7–177)
Total CK: 160 U/L (ref 7–177)
Troponin I: 0.02 ng/mL (ref 0.00–0.06)

## 2010-10-08 LAB — PROTEIN ELECTROPH W RFLX QUANT IMMUNOGLOBULINS
Alpha-2-Globulin: 9.6 % (ref 7.1–11.8)
Beta 2: 5 % (ref 3.2–6.5)
Beta Globulin: 6 % (ref 4.7–7.2)
Gamma Globulin: 19.2 % — ABNORMAL HIGH (ref 11.1–18.8)
M-Spike, %: NOT DETECTED g/dL

## 2010-10-08 LAB — DIFFERENTIAL
Basophils Relative: 1 % (ref 0–1)
Basophils Relative: 0 % (ref 0–1)
Eosinophils Absolute: 0.2 10*3/uL (ref 0.0–0.7)
Eosinophils Relative: 2 % (ref 0–5)
Lymphocytes Relative: 18 % (ref 12–46)
Lymphs Abs: 1.7 10*3/uL (ref 0.7–4.0)
Lymphs Abs: 1.3 10*3/uL (ref 0.7–4.0)
Monocytes Relative: 9 % (ref 3–12)
Monocytes Relative: 7 % (ref 3–12)
Neutro Abs: 5.3 10*3/uL (ref 1.7–7.7)
Neutrophils Relative %: 72 % (ref 43–77)
Neutrophils Relative %: 73 % (ref 43–77)

## 2010-10-08 LAB — NA AND K (SODIUM & POTASSIUM), RAND UR: Potassium Urine: 15 mEq/L

## 2010-10-08 LAB — MAGNESIUM
Magnesium: 2.3 mg/dL (ref 1.5–2.5)
Magnesium: 2.3 mg/dL (ref 1.5–2.5)

## 2010-10-08 LAB — CK
Total CK: 190 U/L — ABNORMAL HIGH (ref 7–177)
Total CK: 292 U/L — ABNORMAL HIGH (ref 7–177)

## 2010-10-08 LAB — PROTEIN / CREATININE RATIO, URINE

## 2010-10-08 LAB — ACTH STIMULATION, 3 TIME POINTS: Cortisol, 60 Min: 38.5 ug/dL (ref >20)

## 2010-10-08 LAB — PHOSPHORUS: Phosphorus: 4.7 mg/dL — ABNORMAL HIGH (ref 2.3–4.6)

## 2010-10-13 LAB — POCT I-STAT, CHEM 8
BUN: 50 mg/dL — ABNORMAL HIGH (ref 6–23)
Calcium, Ion: 1.19 mmol/L (ref 1.12–1.32)
Chloride: 103 mEq/L (ref 96–112)

## 2010-10-13 LAB — CK: Total CK: 579 U/L — ABNORMAL HIGH (ref 7–177)

## 2010-11-10 NOTE — H&P (Signed)
[name], [name] NO.:  [id number]   MEDICAL RECORD NO.:  [id number]          PATIENT TYPE:  EMS   LOCATION:  MAJO                         FACILITY:  [facility]   PHYSICIAN:  Marcellus Scott, MD     DATE OF BIRTH:  08-05-1926   DATE OF ADMISSION:  11/28/2006  DATE OF DISCHARGE:                              HISTORY & PHYSICAL   PRIMARY MEDICAL DOCTOR:  Ralene Ok M.D.   CARDIOLOGIST:  Dr. Allyson Sabal of [facility].   GASTROENTEROLOGIST:  Georgiana Spinner, M.D.   TRANSFERRING FACILITY:  [facility], which is an assisted  living.   CHIEF COMPLAINT:  Precordial chest pain.   HISTORY OF PRESENT ILLNESS:  The patient is a pleasant 77 year old  African-American female, patient with extensive past medical history as  indicated below.  She was in her usual state of health until yesterday  when she started experiencing precordial chest pain.  The patient  indicates that she has precordial sharp intermittent pains with  radiation to the right side of the chest, to the left shoulder and the  left side of the neck with associated dyspnea and diaphoresis.  The  patient said that the chest pain lasts approximately a minute, and  resolves spontaneously, only to recur.  It is not related to exertion or  food intake.  It is not pleuritic in nature.  For these complaints, the  patient has presented to the emergency room.   PAST MEDICAL HISTORY:  1. Gastrointestinal bleeding.  2. Severe gastroesophageal reflux disease.  3. Ischemic cardiomyopathy.  4. Congestive heart failure.  5. Obstructive sleep apnea syndrome on CPAP.  6. Degenerative joint disease.  7. Mild dementia.  8. Dyslipidemia.  9. Coronary artery disease, status post PCI, but no myocardial      infarction.  10.Colonic polyps.  11.Bilateral cataracts.  12.Hypertension.  13.Renal stones.  14.History of rheumatic fever as a child.  15.Glaucoma.  16.History of atrial fibrillation.  17.Osteoarthritis.   PAST SURGICAL HISTORY:  1. Status post bladder tumor removal.  2. Status post bladder tuck.  3. Right breast lumpectomy, benign.  4. Tonsillectomy.  5. Hysterectomy.  6. Back surgery x3.   ALLERGIES:  NO KNOWN DRUG ALLERGIES.   MEDICATIONS:  1. Xyzal 5 mg p.o. daily.  2. Zetia 10 mg p.o. daily.  3. Lasix 60 mg p.o. daily.  4. Plavix 75 mg p.o. daily.  5. Tricor 48 mg p.o. daily.  6. Aldactone 25 mg p.o. daily.  7. Imdur 30 mg p.o. daily.  8. Klonopin 0.5 mg p.o. twice a day.  9. Avapro 300 mg p.o. daily.  10.Nexium 40 mg p.o. daily.  11.Repliva 21/7 one p.o. daily.  12.Celexa 20 mg p.o. daily.  13.Lotensin 10 mg p.o. daily.  14.Toprol XL 100 mg p.o. daily.  15.MiraLAX 17 grams with 8 ounces of liquid by mouth twice daily.  16.Lipitor 40 mg p.o. daily.  17.Nasonex 50 mcg nasal spray, one spray in each nostril every day.  18.Colace 100 mg p.o. twice daily.  19.Cardizem 60 mg p.o. twice daily.  20.Beano three tablets  p.o. before meals.  21.Reglan 5 mg p.o. by mouth before meals and at bedtime.  22.Sucralfate 1 gram p.o. four times daily.  23.Senokot S1 p.o. at night.  24.Risperdal 0.5 mg p.o. at night.  25.Vicodin 5/500 one p.o. every 6 p.r.n.  26.Maalox 15 mL p.o. four times daily p.r.n.  27.NitroQuick 0.4 mg sublingual p.r.n., repeat every five minutes up      to three doses maximum.   FAMILY HISTORY:  1. Mother died at age of 34 of gynecological cancer.  She had CVA and      myocardial infarctions.  2. Brother died at 73 years a month ago from ruptured appendicitis and      diabetes complications.   SOCIAL HISTORY:  The patient is widowed.  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  She has no history of smoking, alcohol, or  drug use.  She lives at assisted living and walks with the help of a  walker.   ADVANCED DIRECTIVES:  Full code.   REVIEW OF SYSTEMS:  Over 10 systems reviewed and pertinent for:  1. Intermittent  headaches.  2. Nausea, especially early in the morning but not associated with      vomiting.  3. History of intermittent abdominal pain for a couple of months.  4. Constipation.  5. Bilateral lower extremity swelling.   PHYSICAL EXAMINATION:  GENERAL:  The patient is a moderately built and  obese female patient in no obvious distress.  VITAL SIGNS:  Temperature 97.8, blood pressure 174/60, pulse 51 per  minute regular, respirations 16 per minute, oxygen saturation at 98  percent on room air.  HEENT:  Normocephalic and atraumatic.  Bilateral cataracts.  Bilaterally  pupils equally reacting to light and accommodation.  No oropharyngeal  erythema.  NECK:  Thick.  No JVD, carotid bruit, lymphadenopathy, or goiter,  supple.  RESPIRATORY:  Occasional fine bibasilar crackles but otherwise clear to  auscultation.  CARDIOVASCULAR:  First and second heart sounds are heard.  Soft.  No  third or fourth heart sounds.  No murmurs, rubs, gallops, or clicks.  ABDOMEN:  Obese and midline laparotomy scar, nontender.  No organomegaly  or mass.  Bowel sounds are preserved.  CENTRAL NERVOUS SYSTEM:  The  patient is awake, alert, and oriented x 3 with no focal neurological  deficits.  EXTREMITIES:  Trace pedal edema bilaterally.  No cyanosis or clubbing.  Peripheral pulses are symmetrically felt, right greater than left.  MUSCULOSKELETAL:  Midline surgical spine scar.  SKIN:  Without any rash.   LABORATORY DATA:  CK MB 4.1, troponin less than 0.5, myoglobin 277.  CBC:  Hemoglobin 10.8, hematocrit 32.8, MCV 88, white blood cells 4.5,  platelets 249.  Next set of cardiac markers:  CK-MB 3.3, troponin less  than 0.5, myoglobin 261, creatinine 1.7 (which seems to be the  baseline).  Basic metabolic panel:  Sodium 139, potassium 4, chloride  106, bicarb 25, BUN 33, glucose 86.  Urinalysis done on the 20th of May was negative for picture of urinary tract infection.  Hepatic panel done  on the 20th of May  was unremarkable.   EKG:  EKG is sinus bradycardia at 51 beats per minute.  Otherwise, no  acute ischemic changes, and it is unchanged compared to the one in March  2008, except there was a first degree heart block in March.   X-RAY:  Chest x-ray has been reported and viewed by me as cardiomegaly,  no edema.  Bilateral atelectasis.   ASSESSMENT/PLAN:  1. Chest pain, questionable for unstable angina, rule out acute      coronary syndrome.  Other differential diagnosis include      gastroesophageal reflux disease, musculoskeletal pain.  We will      admit the patient to telemetry.  We will cycle serial cardiac      enzymes. Will check ddimer. We will place the patient on oxygen,      Plavix, Toprol, nitrates.  For intravenous heparin drip. We will      consult cardiology.  2. Coronary artery disease, status post percutaneous intervention.      Management per problem number 1.  3. History of congestive heart failure, not clinically in overt heart      failure.  We will continue home medications.  We will check BNP.  4. Chronic kidney disease with creatinine probably at baseline, to      monitor basic metabolic panel.  5. Gastroesophageal reflux disease for PPIs, sucralfate.  6. Obstructive sleep apnea syndrome for CPAP at night.  7. Dyslipidemia, to continue the patient's home medications, get      fasting lipids.  8. Hypertension is uncontrolled.  It is unclear if she got her      medications today, to continue her home meds and monitor blood      pressures.  9. History of atrial fibrillation.  However, the patient currently is      in sinus brady and not on anticoagulation.  Continue home      medications.  10.Normocytic anemia,  to follow up CBCs.      Marcellus Scott, MD  Electronically Signed     AH/MEDQ  D:  11/28/2006  T:  11/28/2006  Job:  [id number]   cc:   Georgiana Spinner, M.D.  Ralene Ok, M.D.  Nanetta Batty, M.D.

## 2010-11-10 NOTE — Op Note (Signed)
[name], [name]               ACCOUNT NO.:  [id number]   MEDICAL RECORD NO.:  [id number]          PATIENT TYPE:  AMB   LOCATION:  ENDO                         FACILITY:  [facility]   PHYSICIAN:  Georgiana Spinner, M.D.    DATE OF BIRTH:  Mar 30, 1927   DATE OF PROCEDURE:  11/11/2006  DATE OF DISCHARGE:                               OPERATIVE REPORT   PROCEDURE:  Upper endoscopy with biopsy.   INDICATIONS:  A gastric polyp previously noted and concern for  enlargement.   ANESTHESIA:  Fentanyl 100 mcg, Versed 10 mg.   PROCEDURE:  With the patient mildly sedated in the left lateral  decubitus position, the Pentax videoscopic endoscope was inserted into  the mouth, passed under direct vision through the esophagus, which  appeared normal, into the stomach.  Fundus and body appeared normal.  Antrum, however, showed a fairly large 1-2 cm polyp which had been seen  previously.  It was photographed and biopsied.  There was slight amount  of bleeding from the biopsy sites so this was injected with epinephrine  2 mL.  We advanced the duodenal bulb, second portion of the duodenum.  Both appeared normal.  From this point, the endoscope was slowly  withdrawn, taking circumferential views of duodenal mucosa until the  endoscope had been pulled back into the stomach, placed in retroflexion  to view the stomach from below.  The endoscope was straightened and we  observed that the polyp site had stopped bleeding.  The endoscope was  withdrawn.  The patient's vital signs and pulse oximeter remained  stable.  The patient tolerated procedure well without apparent  complications.   FINDINGS:  Antral polyp biopsied.  Await biopsy report.  The patient  will call me for results and follow up with me as needed.           ______________________________  Georgiana Spinner, M.D.     GMO/MEDQ  D:  11/11/2006  T:  11/11/2006  Job:  [id number]   cc:   Ralene Ok, M.D.

## 2010-11-10 NOTE — Consult Note (Signed)
[name], [name]               ACCOUNT NO.:  [id number]   MEDICAL RECORD NO.:  [id number]          PATIENT TYPE:  INP   LOCATION:  1444                         [facility]ACILITY:  [facility]   PHYSICIAN:  Wilber Bihari. Caryn Section, M.D.   DATE OF BIRTH:  May 26, 1927   DATE OF CONSULTATION:  09/18/2008  DATE OF DISCHARGE:                                 CONSULTATION   She has 3 medical record numbers:  [id number], [id number] and [id number] - the  last one is one that is being used for this admission!   Ms. Wisner is a 77 year old black woman admitted with weakness, muscle  aches and pains, and dysuria.  She had on lab elevated BUN/CR/potassium  and hyponatremia.  She has been given Kayexalate, IV fluids (normal  saline at 100 mL an hour) and renal consult was requested by Dr.  Kathryne Hitch.  Results of renal function over the last several months are  as follows:   Date -  BUN/CR  November 2009 -  17/1.8.  August 24, 2008 -  50/2.4.  September 17, 2008 -  69/2.95.  September 18, 2008 -  63/2.7.   PAST MEDICAL HISTORY:  1. ASCVD.      a.     Coronary artery disease (PCI by Nanetta Batty 2005).      b.     Renal artery stenosis, 80% at the time of October 2006       cardiac catheterization.      c.     Atrial fibrillation, not Coumadin candidate according to       notes in the prior records.  2. She also gives a past history of rheumatic fever as a child.  3. Patch of bladder 3 times Dr. Mel Almond.  4. TAH plus question bladder surgery at the time of hysterectomy.  5. Right breast lumpectomy.  6. DM-2 (no medications).  7. Hypertension.  8. Klebsiella pyelonephritis November 2006.  9. Back surgery.  10.GERD (GI procedures done by Dr. Sabino Gasser).   SOCIAL HISTORY:  She was born in [place].  She finished ninth grade,  had to quit school because of rheumatic fever.  Married for 2 years,  husband died (cancer) over 50 years ago.  She lives at [facility]  She doesn't smoke cigarettes or drink  alcohol.   FAMILY HISTORY:  Father died at age 1 of cancer.  Mother died at age 7  of cancer.  Two brothers, one died of diabetes at age 32, one brother  was murdered age 55.  One sister in her 63s is well.  She has no  children.   REVIEW OF SYSTEMS:  Dysuria, muscle aches.  No angina, no claudication,  no melena, hematochezia, no gross hematuria.  No DOE, no orthopnea, no  purulent sputum, no hemoptysis.   MEDICATIONS PRIOR TO ADMISSION:  1. Spironolactone 50 a day.  2. Benazepril 10 a day.  3. Avapro 300 a day.  4. Metoprolol 100 a day.  5. Xyzal 5 a day.  6. Sanctura 60 a day.  7. Plavix 75 a day.  8  Celexa 25  a day.  1. Furosemide 60 a day.  2. Repliva (vitamin) a day.  3. KCl 10 a day.  4. Cardizem 60 b.i.d.  5. Klonopin 0.5 b.i.d.  6. Protonix 40 a day.  7. Crestor 5 a day.  8. Zetia 10 a day.  9. Tricor 48 a day.  10.Imdur 30 a day.  11.Reglan 5 q.i.d.  12.Eye drops for glaucoma.   CURRENT MEDICATIONS:  1. Normal saline at 100 an hour.  2. Plavix 75 a day.  3. Protonix 40 a day.  4. Crestor 5 a day.  5. Zetia 10 a day.  6. Toprol 100 a day.  7. Tricor 48 a day.  8. Imdur 30 a day.  9. Cardizem 60 b.i.d.   PHYSICAL EXAMINATION:  She is awake, oriented x3.  Temperature 98.2,  pulse 62, respirations 18, blood pressure 110/50.  CHEST:  Clear.  HEART:  No rub.  ABDOMEN:  Scar from umbilicus to pubis and scars from back surgery.  Nontender.  Bowel sounds present.  EXTREMITIES:  No edema.  Nontender.  NEUROLOGICAL:  Right-handed sensation intact.   Urinalysis:  pH 6.5, SG 1.005, dipstick negative, 0-2 white cells, 0 red  cells, few bacteria.  Culture pending.  BUN/CR 63/2.7 (today), sodium  134, potassium 5.5, chloride 102, CO2 25, calcium 9, albumin 3.6,  phosphorus 4.6, magnesium 2.3.  Hemoglobin 12.4, WBC 6400, platelets  257,000.   IMPRESSION:  Acute worsening of chronic kidney disease ? etiology likely  secondary to ACE inhibitor/AII blocker with  superimposed volume  depletion.  Although she complains of dysuria, urinalysis is bland.  Recommend SPEP, UPEP to be certain she does not have myeloma (I doubt  she has this).  Check renal ultrasound.  Check CK (pain in muscles).  Ask GU to see regarding dysuria (several bladder surgeries in the past).  Hold blood pressure medications (blood pressure currently not high; if  blood pressure decreases any further, would decrease or hold  metoprolol plus/minus Imdur).  Ask [facility] whether or not she needs these  (B_-blocker + Imdur) for angina or for blood pressure control.  I am not  sure why she was on both ACE inhibitor and a II blocker (KCl,  spironolactone, and beta blocker will predispose to hyperkalemia).  Will  follow.      Richard F. Caryn Section, M.D.  Electronically Signed     RFF/MEDQ  D:  09/18/2008  T:  09/18/2008  Job:  [id number]

## 2010-11-10 NOTE — Discharge Summary (Signed)
[name], [name]               ACCOUNT NO.:  [id number]   MEDICAL RECORD NO.:  [id number]          PATIENT TYPE:  INP   LOCATION:  4715                         FACILITY:  [facility]   PHYSICIAN:  Marcellus Scott, MD     DATE OF BIRTH:  07-31-1926   DATE OF PROCEDURE:  DATE OF DISCHARGE:  11/29/2006                    STAT - MUST CHANGE TO CORRECT WORK TYPE   PRIMARY MEDICAL DOCTOR:  Dr. Renaee Munda   CARDIOLOGIST:  Dr. Allyson Sabal of [facility].   GASTROENTEROLOGIST:  Dr. Sabino Gasser   DISCHARGE DIAGNOSES:  1. Atypical chest pain.  2. Coronary artery disease status post PCI.  3. Congestive heart failure.  4. Chronic kidney disease.  5. Gastroesophageal reflux disease.  6. Dyslipidemia.  7. Hypertension.  8. Obstructive sleep apnea syndrome.  9. History of atrial fibrillation.  10.Normocytic anemia.   DISCHARGE MEDICATIONS:  Basically the same medications the patient was  on prior to admission except Lotensin or Benazepril which is 20 mg p.o.  daily.  1. Xyzal 5 mg p.o. daily.  2. Zetia 10 mg p.o. daily.  3. Lasix 60 mg p.o. daily.  4. Plavix 75 mg p.o. daily.  5. Tricor NFE tablet 48 mg 1 p.o. daily.  6. Aldactone 25 mg p.o. daily.  7. Imdur 30 mg p.o. daily.  8. Klonopin 0.5 mg p.o. twice daily.  9. Avapro 300 mg p.o. daily.  10.Nexium 40 mg p.o. daily.  11.Repliva 21/7 tablet - 1 p.o. daily.  12.Celexa 20 mg p.o. daily.  13.Lotensin 20 mg p.o. daily.  14.Toprol XL 100 mg p.o. daily.  15.MiraLax 17 gm in 4-8 ounces of liquid p.o. twice daily.  16.Lipitor 40 mg p.o. daily.  17.Nasonex 50 mcg nasal spray 1 spray in each nostril every day.  18.Colace 100 mg p.o. twice daily.  19.Cardizem 60 mg p.o. twice daily.  20.Beano tablet, 3 tablets p.o. before meals.  21.Reglan 5 mg p.o. before meals and at bedtime.  22.Sucralfate 1 gm p.o. 4 times a day.  23.Senokot S 1 p.o. at night.  24.Risperdal 0.5 mg p.o. at night.  25.Vicodin 5/500 1 p.o. q.6 p.r.n.  26.Maalox suspension 15 mL p.o. 4 times a day p.r.n.  27.Nitro-Quick 0.4 mg tablet, 1 tab sublingual q.5 minutes up to 3      doses as needed for chest pain.   PROCEDURES:  Chest x-ray on November 28, 2006.  Impression is cardiomegaly  without congestive heart failure, bibasilar atelectasis.   PERTINENT LAB RESULTS:  1. Urinalysis negative for features of UTI.  2. Basic metabolic panel remarkable for BUN of 22, creatinine of 1.54,      creatinine was 1.7 on the day of admission.  Lipid panel with      cholesterol of 126, triglycerides 110, HDL 40, LDL 64, VLDL 22.      Complete blood count with hemoglobin of 11.1 and hematocrit 34.      MCV of 88.2.  BNP of 54.  D-dimer of 0.27.  Cardiac markers with CK      of 369 and before that 400.  Troponin is negative.   CONSULTATIONS:  Cardiology  from Dr. Allyson Sabal of [facility] and  [facility].   HOSPITAL COURSE AND PATIENT DISPOSITION:  For details of the initial  part of the admission please refer to the history and physical note done  by Dr. Waymon Amato on November 28, 2006.  In summary [name] is a 77 year old  pleasant female patient with extensive past medical history who was  transferred from assisted living yesterday with history of precordial  sharp intermittent chest pains with radiation to left shoulder, neck and  the right side of the chest with associated dyspnea and diaphoresis.  These episodes lasted 1 minute each time and were not associated with  any pleuritic component or feeds.  With her known history of coronary  artery disease and current history suspicious for unstable angina, the  patient was admitted to the telemetry bed.  Her cardiac enzymes were  cycled and unremarkable, D-dimer was negative.  She was placed on an  unfractionated heparin drip and all her home cardiac medications were  continued.  Cardiology consult was obtained who saw her and followed her  today too and have indicated that chest pain is less likely to be   cardiac.  She has had similar presentations in the past when she had a  cath with noncritical CAD. Her Lotensin was increased secondary to  elevated blood pressure.  Her Cardizem also was increased to 3 times a  day, however, with sinus brady in the high 40s to 50s it has been cut  back to 60 mg twice a day.  The patient is on maximum cardiac  medications and they have indicated that she may need a further GI eval.  The patient, however, indicates that she has had an upper GI endoscopy  by Dr. Virginia Rochester and has a followup visit with him in the middle of this month  which she is to keep up.  The cardiologist has indicated that there is  no further cardiac eval necessary at this time including a cath or a  Myoview.  They have advised her to followup with Dr.  Allyson Sabal in 2-3 weeks as an outpatient.  The patient will thereby be  discharged to followup with her primary medical doctor,  gastroenterologist and her cardiologist.  The patient has been advised  to seek immediate medical attention if there is any further  deterioration in her status.      Marcellus Scott, MD  Electronically Signed     AH/MEDQ  D:  11/29/2006  T:  11/29/2006  Job:  [id number]   cc:   Nanetta Batty, M.D.  Georgiana Spinner, M.D.

## 2010-11-10 NOTE — Consult Note (Signed)
[name], [name]               ACCOUNT NO.:  [id number]   MEDICAL RECORD NO.:  [id number]          PATIENT TYPE:  INP   LOCATION:  1444                         FACILITY:  [facility]   PHYSICIAN:  Ronald L. Earlene Plater, M.D.  DATE OF BIRTH:  [birth year]   DATE OF CONSULTATION:  09/19/2008  DATE OF DISCHARGE:                                 CONSULTATION   REASON FOR CONSULTATION:  Dysuria and suprapubic discomfort, right flank  pain.   HISTORY:  This patient was last seen in December 2009.  She does have  occasional urge incontinence, currently on Sanctura.  She does okay with  her incontinence unless she has lots of water and will wet the bed.  She  was recommended for an __________ chair; however, never returned for  followup.   Currently, for the last two months she has complaints of burning at the  urethra when she urinates only.  She often describes diffuse lower  abdominal SP tube discomfort intermittently.  She has a history of two  to three bladder tacks many years ago.  She has a known stone in her  right kidney that has been present for many years.  Sometimes the stone  hurts.  She has seen blood in her urine two or three months ago, none  recently.  She denies fever.  Does have some occasional chills.  She is  urinating on her own.  Her only difficulty is burning.   PAST MEDICAL HISTORY:  1. Atrial fibrillation.  2. History of congestive heart failure.  3. History of cardiomyopathy.  4. Coronary artery disease.  5. Degenerative joint disease.  6. Dementia.  7. Gastroesophageal reflux disease.  8. History of hyperlipidemia.  9. Hypertension.  10.Osteoarthritis.  11.Renal artery stenosis.  12.Sleep apnea.  13.Depression.  14.Anxiety.   PAST SURGICAL HISTORY:  1. History of fibroid resection.  2. History of a hysterectomy.  3. Appendectomy.  4. Right lobectomy.  5. Bladder tack.   FAMILY HISTORY:  Noncontributory.   SOCIAL HISTORY:  Nonsmoker.  No alcohol.  The  patient lives in an  assisted living facility.   REVIEW OF SYSTEMS:  Please see the HPI.   ALLERGIES:  No known drug allergies.   MEDICATIONS:  1. Tri-Chlor 48 mg daily.  2. Imdur 30 mg daily.  3. Avapro 300 mg daily.  4. Kay-Ciel 10 mEq daily.  5. Diltiazem 60 mg twice daily.  6. Clonazepam 0.5 mg daily.  7. Reglan 5 mg before meals or food nightly.  8. Sucralfate 1 gram four times daily.  9. Spironolactone 50 mg daily.  10.Xyzal 5 mg daily.  11.Sanctura XR 60 mg daily.  12.Plavix 75 mg daily.  13.Talopram 20 mg daily.  14.Lasix 60 mg daily.  15.Repliva 21/7 capsules daily.  16.Benazepril 10 mg daily.  17.Protonix 40 mg daily.  18.Crestor 5 mg daily.  19.Zetia 10 mg daily.  20.Metoprolol 10 mg daily.   PHYSICAL EXAMINATION:  GENERAL:  A 77 year old [place] American female,  in no acute distress.  She is alert and oriented x3.  VITAL SIGNS:  Temperature 98.5 degrees, pulse 66,  respirations 18, blood  pressure 132/56.  In's and Out's are greater than 2000 in and 850 out.  ABDOMEN:  Obese, soft, nondistended.  Mild discomfort with palpation  over the left lower quadrant in the suprapubic area.  Mild right CVAT.  GENITOURINARY:  Meatus within normal limits.  No visualized stress  urinary incontinence with cough.  External genitalia normal.   LABORATORY DATA:  Urine culture:  9000 multiple species, insignificant.  Sodium 133, potassium 4.6, chloride 104, CO2 of 23, glucose 102, BUN 53,  creatinine 2.27.  WBCs 6.4, hemoglobin 9.5, hematocrit 28.5,  platelets  205.  Urinalysis showed trace leukocytes.  Microscopic showed a few  bacteria.   A renal ultrasound today shows the right kidney 7.7 cm, the left kidney  was 10.3 cm.  No masses __________ or stenosis.  Physiologically  distended bladder.   IMPRESSION:  Dysuria, incontinence, right flank pain, left lower  quadrant pain with palpation.   PLAN:  When I get__________creatinine  __________, will give Valium 5  mg, one  p.o. twice daily for three days, obtained success with burning.  Needs an office cystoscopy as an outpatient to evaluate the dysuria.  Decreased Sanctura to 20 mg daily.  A CT to evaluate the right flank  pain.  Bladder scan Pezzer's x2 voids, to assess bladder emptying.     ______________________________  Alessandra Bevels. Chase Picket, FNP-C      Ronald L. Earlene Plater, M.D.  Electronically Signed    JML/MEDQ  D:  09/19/2008  T:  09/19/2008  Job:  [id number]

## 2010-11-10 NOTE — Discharge Summary (Signed)
[name], [name]               ACCOUNT NO.:  [id number]   MEDICAL RECORD NO.:  [id number]          PATIENT TYPE:  INP   LOCATION:  1444                         FACILITY:  [facility]   PHYSICIAN:  Hillery Aldo, M.D.   DATE OF BIRTH:  06/25/1927   DATE OF ADMISSION:  09/17/2008  DATE OF DISCHARGE:  09/24/2008                               DISCHARGE SUMMARY   PRIMARY CARE PHYSICIAN:  Ralene Ok, M.D.   DISCHARGE DIAGNOSES:  1. Acute renal failure secondary to dehydration, volume contraction      and ongoing use of ACE inhibitor/ARB and diuretic therapy.  2. Underlying stage 3 chronic kidney disease.  3. History of paroxysmal atrial fibrillation.  4. History of congestive heart failure.  5. History of coronary artery disease.  6. Dementia.  7. Degenerative joint disease/osteoarthritis.  8. Gastroesophageal reflux disease.  9. Hypertension.  10.Renal artery stenosis.  11.Depression.  12.Anxiety.  13.Normocytic anemia.  14.Hyperkalemia, resolved.  15.Chronic constipation.  16.Obstructive sleep apnea.  17.History of breast cancer, status post right breast lumpectomy.  18.Dysuria.  19.Dementia.  20.Right nephrolithiasis.  21.Sigmoid diverticulosis.   DISCHARGE MEDICATIONS:  1. Xyzal 5 mg p.o. daily.  2. Sanctura 20 mg p.o. daily.  3. Plavix 75 mg p.o. daily.  4. Citalopram 20 mg p.o. daily.  5. Repliva 21/7 one tablet p.o. daily.  6. Protonix 40 mg p.o. daily.  7. Crestor 5 mg p.o. daily.  8. Zetia 10 mg p.o. daily.  9. Metoprolol 75 mg p.o. daily.  10.Tricor 48 mg p.o. daily.  11.Imdur 30 mg p.o. daily.  12.Diltiazem 30 mg p.o. b.i.d.  13.Clonazepam 0.5 mg p.o. b.i.d.  14.Reglan 5 mg p.o. q.a.c. and h.s.  15.Sucralfate 1 gram p.o. q.i.d.  16.Lotemax eye drops 1 drop in the right eye b.i.d.  17.MiraLax 17 grams daily p.r.n. constipation.   CONSULTATIONS:  1. Dr. Gaynelle Arabian of urology.  2. Dr. Marina Gravel of nephrology.   BRIEF ADMISSION HISTORY OF PRESENT  ILLNESS:  The patient is a 77-year-  old female who resides in an assisted-living facility and was brought to  the hospital for evaluation of progressive weakness, dehydration,  abdominal pain and dysuria.  On initial evaluation, she was noted to be  hyperkalemic, hyponatremic, and in acute renal failure and subsequently  was admitted for further evaluation and workup.  For the full details,  please see the dictated report done by Dr. [place] Callas.   PROCEDURES AND DIAGNOSTIC STUDIES:  1. Chest x-ray on September 17, 2008, showed no active cardiopulmonary      disease.  2. Renal ultrasound on September 18, 2008, was negative with no      hydronephrosis.  3. A CT scan of the abdomen and pelvis on September 19, 2008, showed no      acute abdominal process.  Right nephrolithiasis.  Coronary and      aortic calcifications.  Sigmoid diverticulosis.  4. Chest x-ray on September 20, 2008, showed no acute cardiopulmonary      process.   DISCHARGE LABORATORY VALUES:  Sodium was 140, potassium 4.2, chloride  111, bicarb 24, BUN 18, creatinine  1.55, glucose 94.  White blood cell  count was 7.4, hemoglobin 9.2, hematocrit 27.9, platelets 2003.   HOSPITAL COURSE BY PROBLEM:  1. Weakness secondary to acute renal failure/dehydration and      underlying stage 3 chronic kidney disease:  The patient was      admitted and several of her routine medications were stopped,      including her ACE inhibitor, ARB, and diuretic therapy.  She was      gently hydrated and a renal consultation was requested and kindly      provided by Dr. Caryn Section.  It was felt that her worsening renal function      was probably due to volume contraction and dehydration in the      setting of ongoing ACE inhibitor/ARB and diuretic use.  A      urinalysis was bland and an SPEP and UPEP were checked and did not      show any evidence of myeloma.  Renal ultrasound was checked, which      did not reveal any evidence of obstructive uropathy.  Because of       her history of renal artery stenosis, an ACE inhibitor and an ARB      are currently contraindicated.  At this point, her blood pressure      has been well controlled off these medications on the regimen as      noted above.  Her renal function has improved from an admission      creatinine of 2.95 to a discharge creatinine of 1.55, which is      likely her baseline.  2. History of paroxysmal atrial fibrillation:  The patient has been      maintaining normal sinus rhythm.  Her rate has been somewhat      bradycardic, and, given this, her metoprolol dose has been      decreased.  She should continue to have her pulse checked and her      medications titrated based on her pulse rate and blood pressure.  3. History of congestive heart failure/coronary artery disease:  The      patient has been medically stable.  She has been well compensated      with no evidence of acute congestive heart failure.  4. Dementia:  The patient has been stable with regard to her behavior      and mood.  5. Degenerative joint disease/osteoarthritis:  The patient has been      stable and has been working with physical and occupational therapy      and is at her baseline level of mobility.  6. Gastroesophageal reflux disease:  The patient was maintained on      proton pump inhibitor therapy.  7. Hypertension with history of renal artery stenosis:  The patient's      blood pressure is currently well controlled on the regimen as      outlined above.  Her metoprolol and Cardizem were both decreased in      dosage, and her ACE inhibitor, ARB, and Lasix have been      discontinued entirely.  She will need to have close followup to      ensure that her blood pressure remains well controlled and her      medications adjusted accordingly.  Given her renal artery stenosis,      would avoid ACE inhibitors and ARBs in this patient.  8. Depression/anxiety:  The patient has been stable on Celexa.  9. Normocytic anemia:  This  is likely due to underlying chronic kidney      disease.  Her hemoglobin and hematocrit have remained stable.      Consideration for outpatient nephrology evaluation and followup is      recommended as she may be a candidate for treatment with Aranesp      and Venofer.  10.Hyperkalemia:  The patient's hyperkalemia was felt to be iatrogenic      in the setting of dehydration and ongoing use of ACE inhibitors,      ARBs and spironolactone.  She received Kayexalate and her potassium      level normalized and has remained normal.  She has been taken off      supplementation doses of potassium.  11.History of dyslipidemia:  The patient was maintained on statin      therapy.  She will be discharged on her usual medications.  12.Obstructive sleep apnea:  The patient has remained stable.   DISPOSITION:  The patient is medically stable for discharge back to her  assisted-living facility.  She will need home health PT, OT, and nursing  services, and these will be set up prior to discharge.  She should  follow up with her primary care physician next week.   Time spent coordinating care for discharge and then discharge  instructions equals 35 minutes.      Hillery Aldo, M.D.  Electronically Signed     CR/MEDQ  D:  09/24/2008  T:  09/24/2008  Job:  [id number]   cc:   Ralene Ok, M.D.  Fax: [phone]

## 2010-11-10 NOTE — H&P (Signed)
[name], [name]               ACCOUNT NO.:  [id number]   MEDICAL RECORD NO.:  [id number]          PATIENT TYPE:  INP   LOCATION:                               [facility]ACILITY[facility]   PHYSICIAN:  Pedro Earls, MD     DATE OF BIRTH:  [birth year]   DATE OF ADMISSION:  09/17/2008  DATE OF DISCHARGE:                              HISTORY & PHYSICAL   PRIMARY CARE PHYSICIAN:  Ralene Ok, M.D.   CHIEF COMPLAINT:  Weakness, dehydration, and abdominal pain.   HISTORY OF PRESENT ILLNESS:  This is a 77 year old African American  female patient who lives in an assisted living facility.  She was  admitted with hyperkalemia and hyponatremia.  According to the patient,  for the past 2 months the patient had been experiencing weakness which  has been gradually getting worse.  The patient has also been complaining  of abdominal pain and lethargy as well.  The patient has had significant  loss of appetite, and every time she eats, the patient feels pain in her  abdomen and some discomfort.  The patient denies any nausea, vomiting,  fever, chills, or shortness of breath.  Also, the patient used to walk  with a walker until a few days ago since when the patient had difficulty  getting out of bed and ambulating.   REVIEW OF SYSTEMS:  As above. The rest of Review of Systems is negative.   PAST MEDICAL HISTORY:  1. Atrial fibrillation.  2. History of congestive heart failure.  3. History of cardiomyopathy.  4. Coronary artery disease.\  5. DJD.  6. Dementia.  7. GERD.  8. History of hyperlipidemia.  9. Hypertension.  10.Osteoarthritis.  11.Renal artery stenosis.  12.Sleep apnea.  13.Depression.  14.Anxiety.   PAST SURGICAL HISTORY:  1. History of fibroid resection.  2. History of hysterectomy.  3. Appendectomy.  4. Right breast lumpectomy.   FAMILY HISTORY:  Noncontributory.   SOCIAL HISTORY:  Nonsmoker, no alcohol, not a drug abuser.  The patient  lives at an assisted living  facility.   ALLERGIES:  No known drug allergies.   MEDICATIONS:  1. TriCor 48 daily.  2. Imdur 30 daily.  3. Avapro 300 daily.  4. KCl 10 mEq daily.  5. Diltiazem 60 b.i.d.  6. Clonazepam 0.5 b.i.d.  7. Reglan 5 mg before meals or food and nightly.  8. Sucralfate 1 gram 4 times a day.  9. Spironolactone 50 mg daily.  10.Xyzal 5 mg daily.  11.Sanctura XR 60 daily.  12.Plavix 75 daily.  13.Citalopram 20 daily.  14.Lasix 60 mg daily.  15.Repleva 21/7 capsules daily.  16.Benazepril 10 mg daily.  17.Protonix 40 daily.  18.Crestor 5 daily.  19.Zetia 10 daily.  20.Metoprolol 100 mg daily.   PHYSICAL EXAMINATION:  VITAL SIGNS:  Temperature 97.8, blood pressure  130/58, pulse 80, respirations 20, pulse oximetry 97% on room air.  GENERAL:  The patient was awake, alert, oriented x3.  Does not appear to  be in acute distress.  HEENT:  Pupils equal, round, and reactive to light.  No icterus, no  pallor.  Extraocular movements intact.  Oral mucosa dry.  NECK:  Supple.  No JVD, no lymphadenopathy.  CARDIOVASCULAR:  S1,S2, irregular.  CHEST:  Clear.  ABDOMEN:  Soft, obese, nontender.  Bowel sounds present.  No  hepatosplenomegaly.  EXTREMITIES:  No clubbing, cyanosis, or edema.  CNS:  Grossly intact.  Cranial nerves II-XII intact.  MUSCULOSKELETAL:  Unremarkable.   LABORATORY DATA:  UA is negative.  CBC fairly unremarkable. Sodium 129,  potassium 6.2, creatinine 2.95.   Chest x-ray did not reveal anything acute.   IMPRESSION:  1. Weakness.  2. Dehydration.  3. Acute on chronic renal failure.  4. Hyperkalemia.  5. Hyponatremia.  6. Rule out adrenal insufficiency.  7. History of renal artery stenosis.  8. Hypertension.  9. Obesity.   PLAN:  1. Admit to telemetry.  2. Will obtain 12-lead EKG since it was done in the ED.  3. Normal saline at 80 mL per hour.  4. Hold diuretics, potassium, ACE, ARB, spironolactone.  5. Check  cosyntropin stimulation test.  6. Check cortisol  level.  7. OT and PT evaluations.  8. Check basic metabolic panel with magnesium in the morning.      Pedro Earls, MD  Electronically Signed     NS/MEDQ  D:  09/17/2008  T:  09/17/2008  Job:  [id number]   cc:   Ralene Ok, M.D.  Fax: [phone]

## 2010-11-13 NOTE — Discharge Summary (Signed)
[name], [name]                           ACCOUNT NO.:  [id number]   MEDICAL RECORD NO.:  [id number]                   PATIENT TYPE:  INP   LOCATION:  3701                                 FACILITY:  [facility]   PHYSICIAN:  Gertha Calkin, M.D.             DATE OF BIRTH:  Oct 07, 1926   DATE OF ADMISSION:  11/14/2002  DATE OF DISCHARGE:  11/17/2002                                 DISCHARGE SUMMARY   DISCHARGE DIAGNOSES:  1. Hypertension, emergency.  2. Chest pain.  3. Hypertension.  4. Glaucoma.  5. Bilateral cataracts.  6. Arthritis.  7. __________.  8. Chronic constipation.  9. Hypokalemia.   DISCHARGE MEDICATIONS:  1. Klonopin 25 mg p.o. daily.  2. Prinivil 40 mg p.o. daily.  3. Propanolol 40 mg p.o. daily.  4. Risperdal 0.5 mg p.o. q.h.s.  5. Nexium 40 mg p.o. daily.  6. Ambien 10 mg p.o. q.h.s. p.r.n.  7. Carafate 10 mL before meals and at bedtime.  8. Norvasc 10 mg p.o. daily.  9. K-Dur 20 mEq p.o. daily.  10.      Lasix 40 mg p.o. daily.  11.      MiraLax p.r.n. q.h.s.   PROCEDURE:  1. Two-dimensional echocardiogram performed Nov 23, 2002, showed overall     left ventricular systolic function to be normal.  Left ventricular     ejection fraction was estimated at 55 to 65% with no regional wall motion     abnormalities. There was mildly increased LV wall thickness.  There was     mild aortic valvular regurgitation.  There was moderate calcification of     mitral valve involving the anterior leaflet without significant     restriction of leaflet motion and mild mitral valvular regurgitation.     The patient's echo was read by Peter M. [place], M.D.  2. MRI/MRA of the abdomen to rule out stenosis.  Summary includes that there     is no evidence of free fluid or adenopathy. The impression was that no     evidence of renal artery stenosis.  Common origin of SMA and celiac axis.     This study was performed on Nov 15, 2002.   FOLLOW UP:  Follow-up appointment with  Lorelle Formosa, M.D. on May 26,  at 11 a.m. which was scheduled by Dr. Linford Arnold.  At follow-up the patient  should have labs followed up from hospital draw which include a 24-hour  urine cortisol as well as a BMET to determine a potassium levels in her  serum.   HISTORY OF PRESENT ILLNESS:  A pleasant 77 year old African-American female  presents with complaints of swelling fingers and eyebrows for the past few  weeks.  This apparently worsened over the last week and also had chronic  swelling in her other extremities, but never in her hands.  She also reports  that her blood pressure was very high and  this always ends up with her  having symptoms of chest pain.  She actually admitted to having chest pain  this day and described it as sharp across her sternum right to left.  Stated  was 10 out of 10, but as seen by [place] was 4 out of 10.  She also stated having  headaches in her right temple across her forehead and rates it as 8 out of  10 sharp throbbing.  There was some nausea associated with these headaches  and chest pain, but no vomiting.  She states she had been previously on  Lasix for several years, but then was discharged after being transferred to  [facility] approximately a month ago.  On admission, the patient did  admit that she had not taken her blood pressure medications that morning.   ALLERGIES:  No known drug allergies.   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  Urologist is Dr. Earlene Plater.  History of a right ankle fracture back in  2003 and 2004 and no orthopedic physician is identified at this time.   NURSING HOME MEDICATIONS:  1. Propanolol 40 mg p.o. daily.  2. Prinivil 20 mg p.o. daily.  3. Norvasc 5 mg p.o. daily.  4. Protonix 40 mg p.o. daily.  5. Klonopin 0.5 mg p.o. daily.  6. Ambien 10 mg p.o. q.h.s.  7. Carafate 1 gram over 10 mL  q.a.c. and q.h.s.  8. Risperdal 0.5 mg p.o. q.h.s.  9. MiraLax q.h.s.   HABITS:  Negative for tobacco, alcohol, or IV drugs.   SOCIAL HISTORY:  She is widowed and has Medicaid and Medicare, and has  sister and niece who live here in town. She lives at [facility] for the past month. She does need a walker to help her  ambulate.   FAMILY HISTORY:  Positive for MI in her mother who passed away at 52 and  also had stroke and cancer. Father had history of lung cancer and stroke.  Brother is living with diabetes.   REVIEW OF SYSTEMS:  Per HPI.  In addition to that, had itching in her head,  face, and arms which was relieved with nerve pill.   PHYSICAL EXAMINATION:  VITAL SIGNS:  Temperature 98.6, pulse 58,  respirations 20, blood pressure 194/80, weight 194.2, O2 saturation 97% on  room air.  GENERAL:  In no acute distress with mild complaints as listed in HPI.  Pleasant and answering appropriately all of our questions.  HEENT:  Essentially normal.  Funduscopic was difficult to ascertain.  NECK:  No lymphadenopathy, no masses, and no JVP.  LUNGS:  Clear to auscultation bilaterally.  HEART:  Regular rate and rhythm.  No significant bruits heard.  EXTREMITIES:  Good pulses upper and lower symmetric.  ABDOMEN: Within normal limits, soft, nontender, with positive bowel sounds.  No hepatosplenomegaly.  EXTREMITIES:  Trace edema in the feet bilaterally as well as in the hands  bilaterally.  MUSCULOSKELETAL:  Strength intact 5/5 symmetric upper and lower.  NEUROLOGY:  Intact cranial nerves and nonfocal examination.   LABORATORY DATA:  Sodium 140, potassium 3.3, chloride 107, and bicarb 27,  BUN 10, creatinine 1.0, glucose 110, bilirubin 0.6, alkaline phosphatase 67,  AST 23, ALT 15, protein 8.3, albumin 4.0, calcium 9.5.  White blood cell  count 6.1, hemoglobin 12.1, and hematocrit 36.2, platelets  276, MCV 84.  UA was negative for infection. First set of cardiac  enzymes was negative.   HOSPITAL COURSE:  Problem 1.  Hypertensive urgency.  The patient was  admitted to the hospital and had EKG's which were essentially negative and  enzymes which returned with negative for three sets.  Echo did not show any  significant cardiomyopathy with normal LV function.  The patient's blood  pressure was slowly titrated down and some of her medications were  increased.  She was restarted on her Lasix and her Norvasc dose was  increased to 10 mg p.o. daily.  All of these changes were noted in her  discharge sheet and listed in her discharge medications as above.  Plan was  to decrease her blood pressure slowly as she is most likely chronically  requiring increased perfusion pressures after having longstanding  hypertension.  So far, secondary causes have not determined with any  positive findings.  May consider urine catecholamines to rule out  pheochromocytoma, although, her presentation was not classic for this.   Problem 2.  Chest pain as above.  Enzymes were negative.  Started on aspirin  and O2. EKG did not show any ischemia and she had a normal echo.  Would  defer further cardiac workup as symptoms present.   DISPOSITION:  The patient is having fairly controlled blood pressures.  Again, target was 160 systolic over 90's for the first two weeks.  After  that, she needs to slowly be titrated up on her medications to reach a goal  blood pressure of less than 140/80.                                               Gertha Calkin, M.D.    JD/MEDQ  D:  11/17/2002  T:  11/17/2002  Job:  [id number]   cc:   Lorelle Formosa, M.D.  [phone] [address]  [place]  [place] [address]  Fax: [phone]   Shary Decamp, M.D.   Outpatient Clinic   [facility]

## 2010-11-13 NOTE — Discharge Summary (Signed)
[name], [name]               ACCOUNT NO.:  [id number]   MEDICAL RECORD NO.:  [id number]          PATIENT TYPE:  INP   LOCATION:  3731                         FACILITY:  [facility]   PHYSICIAN:  Jonna L. Robb Matar, M.D.DATE OF BIRTH:  1927/06/22   DATE OF ADMISSION:  04/16/2005  DATE OF DISCHARGE:  04/20/2005                           DISCHARGE SUMMARY - REFERRING   PRIMARY CARE PHYSICIAN:  Dr. Ludwig Clarks.   CARDIOLOGY:  [facility].   FINAL DIAGNOSES:  1.  Noncardiac chest pain.  2.  Esophageal spasms.  3.  Gastroesophageal reflux disease.  4.  Coronary artery disease.  5.  Congestive heart failure.  6.  Hypertension.  7.  Hyperlipidemia.  8.  Obstructive sleep apnea.  9.  Chronic constipation.  10. Right renal artery stenosis.   PROCEDURES:  Left heart catheterization along with catheterization of the  abdominal aorta and right renal artery on April 19, 2005, by Dr. Jenne Campus.   ALLERGIES:  NO KNOWN DRUG ALLERGIES.   CODE STATUS:  Full.   HISTORY:  This 77 year old African American female with known coronary  artery disease and multiple risk factors complained of left upper chest pain  that was severe, radiated into the throat, developed in flurries and what  felt like a rapid heart beat.  Patient had the history of a stent to the  circumflex in December of 2005, but a chest pain similarly.  Her other  medical problems include obstructive sleep apnea for which she uses CPAP,  severe osteoarthritis and chronic back pain for which she has been on  Celebrex, chronic constipation and severe GERD for which she is on PPI, H2  blockers, Carafate and Mylanta.   PHYSICAL EXAMINATION:  GENERAL APPEARANCE:  Morbid obesity.  VITAL SIGNS:  Slightly elevated blood pressure of 188/86.  HEENT:  Poor dentition.  CHEST:  Right breast scar.  ABDOMEN:  Lower midline abdominal scar.  EXTREMITIES:  Trace peripheral edema.  MUSCULOSKELETAL:  Osteoarthritic changes.   HOSPITAL COURSE:  The patient had negative troponins, positive CPK-MBs, BNP  is less than 30.  The patient was initially treated with more nitroglycerins  and catheterization done on April 19, 2005, was notable for maximum 50%  stenoses down the LAD.  Abdominal aorta showed 80% stenosis of the right  renal artery by selective angiography.  Left ventricular ejection fraction  was 60%.   As far as the patient's GERD, her Celebrex was stopped and she was  transferred to Ultram.  We also treated her chronic constipation.   DISPOSITION:  Patient is to be discharged on Claritin 10 daily, iron 324  daily, Klonopin 0.5 mg b.i.d., Colace 100 b.i.d., clonidine 0.1 at h.s.,  Carafate 1 g a.c. and h.s., Risperdal 0.5 mg at h.s., Nasonex spray two  sprays daily.  Protonix 40 mg q.a.m. and nightly, Norvasc 10 daily, Senokot  S nightly, Plavix 75 daily, Lasix 40 daily, potassium 20 daily, Lipitor 10  daily, MiraLax 17 g every other day, Ultram 50 mg t.i.d. p.r.n. arthritis  pain, Beano t.i.d. a.c. p.r.n. (patient will supply), Mylanta 30 mL a.c. and  h.s.,  enteric coated aspirin 325 mg daily, Toprol XL 75 mg daily, Avapro 300  daily.   She is to remain on her CPAP.  She is to be on a low salt, heart healthy  diet.  She can shower but not take a bath for 48 hours.   She should follow up with [facility] for an appointment  in two weeks.  At that time, they can discuss renal artery stenting with her  and reevaluate her labs.      Jonna L. Robb Matar, M.D.  Electronically Signed     JLB/MEDQ  D:  04/20/2005  T:  04/20/2005  Job:  [id number]   cc:   Ralene Ok, M.D.  Fax: [phone]   [facility]

## 2010-11-13 NOTE — Discharge Summary (Signed)
[name], [name]               ACCOUNT NO.:  [id number]   MEDICAL RECORD NO.:  [id number]          PATIENT TYPE:  OIB   LOCATION:  6524                         FACILITY:  [facility]   PHYSICIAN:  Nanetta Batty, M.D.   DATE OF BIRTH:  08-03-1926   DATE OF ADMISSION:  06/18/2004  DATE OF DISCHARGE:  06/19/2004                                 DISCHARGE SUMMARY   ADMISSION DIAGNOSES:  1.  Palpitations and chest pain with abnormal electrocardiogram.  2.  Hypertension with poor control.  3.  History of hyperlipidemia.  4.  Obesity.  5.  Remote history of rheumatoid heart disease with a normal echocardiogram      May 2004, left ventricle normal, mild atrial regurgitation, moderate      mitral annular calcification.   DISCHARGE DIAGNOSES:  1.  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  2.  Palpitations and chest pain with abnormal electrocardiogram.  3.  Hypertension with poor control.  4.  History of hyperlipidemia.  5.  Obesity.  6.  Remote history of rheumatoid heart disease with a normal echocardiogram      May 2004, left ventricle normal, mild atrial regurgitation, moderate      mitral annular calcification.   PROCEDURES:  Cardiac catheterization and PTCA June 18, 2004, with an 80%  left circumflex decreased to less than 50%.  No stent was placed.  It was  Dr. Hazle Coca opinion that, if this reoccurs, she will probably require  bifurcation stenting.   BRIEF HISTORY:  The patient is a 77 year old female who lives in a rest  home.  She was referred for evaluation of palpitations and atypical chest  discomfort.  She ultimately underwent cardiac catheterization on June 05, 2004.  This showed 80% circumflex stenosis at a marginal branch.  She was  brought back today for intervention and dilatation of her diseased  circumflex.   PAST MEDICAL HISTORY:  1.  Hypertension.  2.  Hyperlipidemia.  3.  GERD.  4.  Prior surgeries as listed above.   SOCIAL HISTORY:  She is widowed.  She does not  exercise.  She is on a  walker.  She does not use tobacco or alcohol.  She lives in assisted living  home.   FAMILY HISTORY:  Remarkable for mother having cancer and died at 41.  Father  died at 31, did have history of heart trouble.   For further History and Physical, please see the dictated note.   HOSPITAL COURSE:  The patient was admitted after undergoing outpatient  cardiac catheterization where 80% stenosis in the circumflex was found.  The  patient had a complicated percutaneous coronary intervention via the right  femoral artery, and a PTCA of the left circumflex was performed which showed  it going from 80% to less than 50%.  As noted above, it was Dr. Hazle Coca  opinion that, if she had recurrent symptoms, she would probably require  bifurcation stenting.  The patient was placed on aspirin and Plavix and back  on her pre-admission medicines.  Her Foley was removed the following a.m.  She was mobilized and scheduled for  discharge today.   DISCHARGE MEDICATIONS:  Her preadmission medicines and they include:  1.  Promethazine 12.5 mg b.i.d.  2.  Klonopin 0.5 mg b.i.d.  3.  Pepcid 20 mg b.i.d. a.c.  4.  Colace 2 daily.  5.  Carafate 1 g a.c. and h.s.  6.  Allegra 1 daily.  7.  Lasix 40 mg 1 daily.  8.  Potassium 20 mEq 1 daily.  9.  Nexium 40 mg daily.  10. Norvasc 10 mg daily.  11. Inderal 40 mg daily.  12. Lipitor 10 mg daily.  13. Cozaar 40 mg 1 daily.  14. Risperdal 0.5 one daily at h.s.  15. She was also given prescription for nitroglycerin 1 to 150 sublingual      p.r.n.  16. She will also go home on aspirin and Plavix with aspirin 81 mg daily.  17. Plavix 75 mg daily.   DISCHARGE ACTIVITY:  Light to moderate for 72 hours, although her activities  are already markedly limited.   DIET:  Maintain a low-fat diet.   WOUND CARE:  She is to call if she has problems with her catheterization  site.   FOLLOW UP:  Dr. Allyson Sabal will see her back in the office in two weeks,  sooner  if there is a problem.      WDJ/MEDQ  D:  06/19/2004  T:  06/19/2004  Job:  [id number]   cc:   Dr. Ludwig Clarks

## 2010-11-13 NOTE — Discharge Summary (Signed)
NAMESHAKHIA, [name]               ACCOUNT NO.:  [id number]   MEDICAL RECORD NO.:  [id number]          PATIENT TYPE:  INP   LOCATION:  6703                         FACILITY:  [facility]   PHYSICIAN:  Lonia Blood, M.D.      DATE OF BIRTH:  December 20, 1926   DATE OF ADMISSION:  04/28/2005  DATE OF DISCHARGE:  05/02/2005                                 DISCHARGE SUMMARY   PRIMARY CARE PHYSICIAN:  Ralene Ok, M.D.   DISCHARGE DIAGNOSES:  1.  Klebsiella bacteremia.  2.  Klebsiella pyelonephritis.  3.  Acute renal failure, now resolved.  4.  Leukocytosis.  5.  Gastroesophageal reflux disease.  6.  Obstructive sleep apnea.  7.  Hypertension.  8.  Dyslipidemia.   DISCHARGE MEDICATIONS:  1.  Ciprofloxacin 500 mg b.i.d. - continue for 10 more days.  2.  Aspirin 325 mg daily.  3.  Protonix 40 mg nightly.  4.  Claritin 10 mg daily.  5.  Ferrous sulfate 325 mg daily.  6.  Klonopin 0.5 mg b.i.d.  7.  Colace 100 mg b.i.d.  8.  Carafate 1 gram q.a.c. and nightly.  9.  Nasonex spray, 2 sprays daily.  10. Norvasc 10 mg daily.  11. Senokot-S one tab nightly.  12. Plavix 75 mg daily.  13. Lasix 40 mg daily.  14. Potassium chloride 20 mEq daily.  15. Lipitor 10 mg daily.  16. MiraLax laxative 17 grams daily.  17. Ultram 50 mg t.i.d.  18. Beano t.i.d. p.r.n.  19. Mylanta 40 mL q.a.c. and nightly.  20. Toprol XL-75 mg daily.  21. Avapro 300 mg daily.   DISPOSITION:  The patient is to be discharged back to the assisted living  facility.  She will continue to take the antibiotic for at least two to  three weeks total.   PROCEDURE:  1.  Ultrasound of the abdomen performed on April 28, 2005, showing dilated      gallbladder, containing sludge, but no gallstones.  No gallbladder wall      thickening or pericholecystic fluid.  The patient was not tender over      this region.  2.  Abdominal film showed normal bowel gas pattern.  No acute findings.      Stable cardiomegaly as well as tortuosity of  the thoracic aorta and      great vessels, but no active lung disease.   CONSULTATIONS:  None.   HISTORY OF PRESENT ILLNESS:  Please refer to the dictated history and  physical on admission by Dr. Elliot Cousin.  It showed, however, that the  patient is a 77 year old lady who came in with abdominal pain, pain with  urination and subjective fevers and chills.  The patient has multiple  medical problems.  When she was seen here, she had leukocytosis with a white  count of 22,500.  Her creatinine was 1.8.  A urinalysis showed large  leukocyte esterase, WBC's 21-50, and many bacteria.  She also had some CVA  tenderness of note.  Subsequently she was admitted for the treatment of  possible pyelonephritis.   HOSPITAL COURSE:  #  1 - ACUTE PYELONEPHRITIS:  The patient was started on IV  antibiotics, mainly Cipro.  She had a urine culture obtained.  She also had  blood cultures performed, and 3/4 blood cultures came back showing  Klebsiella pneumonia and her urine culture also grew Klebsiella pneumonia,  indicating that her pyelonephritis and bacteremia were secondary to  Klebsiella pneumonia.  Sensitivities from the culture showed that the  organism sensitive to ciprofloxacin among other things.  Hence Cipro was  continued.  We consulted Dr. Rockey Situ. Roxan Hockey regarding her treatment, and  he suggested treating the bacteremia.  She needs to be treated for at least  two to three weeks with Cipro, which could be oral.  We will therefore  continue with this oral antibiotic for two to three weeks, as recommended.   #2 - ACUTE RENAL FAILURE:  On admission the patient's creatinine was 1.8 as  indicated.  This was thought to be pre-renal.  The patient was hydrated  adequately.  Her BUN and creatinine have since returned to within the normal  range.  Her leukocytosis was presumed to be secondary to high infection.  Her white count has since returned to normal with antibiotic coverage.   #3 -  GASTROESOPHAGEAL REFLUX DISEASE:  Will continue the patient on PPI, as  she has peptic ulcer disease and gastroesophageal reflux disease.   #4 - OBSTRUCTIVE SLEEP APNEA: The patient continued to be on CPAP while in  the hospital.   #5 - HYPERTENSION:  Her blood pressure was mostly controlled on her home  medications, which we placed the patient on.   #6 - DYSLIPIDEMIA:  The patient was also continued on her statin during this  hospitalization.   #7 - NAUSEA:  The patient has had bouts of nausea.  Apparently this has been  chronic.  We continue with her regular medications without much change.   Other medical problems were stable during this hospitalization, and the  patient will be discharged as soon as she can walk effectively with a  walker.      Lonia Blood, M.D.  Electronically Signed     LG/MEDQ  D:  05/02/2005  T:  05/02/2005  Job:  [id number]   cc:   Ralene Ok, M.D.  Fax: [phone]

## 2010-11-13 NOTE — Consult Note (Signed)
[facility]  Patient:    [name], Mays Visit Number: [id number] MRN: [id number]          Service Type: EMS Location: [facility] Attending Physician:  Armanda Heritage Dictated by:   Cammy Copa, M.D. Admit Date:  06/22/2001                            Consultation Report  REQUESTING PHYSICIAN:  ______  CHIEF COMPLAINT:  Right ankle pain.  HISTORY OF PRESENT ILLNESS:  [name] is a 77 year old ambulatory patient of an assisted care living facility who fell and twisted her right ankle today.  She has been partial weightbearing on the ankle since that time.  She does report ankle pain.  She denies any other orthopedic complaints.  PAST MEDICAL HISTORY:  Significant for hypertension and increased cholesterol.  ALLERGIES:  No known drug allergies.  MEDICATIONS:  Antihypertensive agents as well as anticholesterol agent.  PHYSICAL EXAMINATION  EXTREMITIES:  She has swelling over the right foot.  She has right knee full range of motion.  She has good pedal pulses, intact sensation on the dorsal and plantar aspect of the foot.  Good toe flexion and extension.  She has no other orthopedic complaints in the upper or lower extremities.  She has no medial sided tenderness.  LABORATORIES:  Plain x-rays show about a 1 mm displaced lateral malleolus fracture.  IMPRESSION:  Lateral malleolus fracture minimally to nondisplaced.  PLAN:  Will put her in a nonweightbearing posterior splint.  Come back to see me in a week.  Repeat x-rays and change over to cast. Dictated by:   Cammy Copa, M.D. Attending Physician:  Armanda Heritage DD:  06/22/01 TD:  06/23/01 Job: [id number]

## 2010-11-13 NOTE — Op Note (Signed)
[name], TRENKAMP                           ACCOUNT NO.:  [id number]   MEDICAL RECORD NO.:  [id number]                   PATIENT TYPE:  AMB   LOCATION:  ENDO                                 FACILITY:  [facility]   PHYSICIAN:  Georgiana Spinner, M.D.                 DATE OF BIRTH:  Jan 18, 1927   DATE OF PROCEDURE:  07/12/2003  DATE OF DISCHARGE:                                 OPERATIVE REPORT   PROCEDURE:  Colonoscopy.   ANESTHESIA:  1. Demerol 100 mg.  2. Versed 10 mg.   INDICATIONS:  Rectal bleeding, colon polyp.   DESCRIPTION OF PROCEDURE:  With patient mildly sedated in the left lateral  decubitus position, the Olympus videoscopic colonoscope was inserted in the  rectum, passed under direct vision to the cecum, identified by the ileocecal  valve and appendiceal orifice, both of which were photographed.  From this  point, the colonoscope was slowly withdrawn, taking circumferential views of  the colonic mucosa, stopping only in the rectum which appeared normal other  than probably what appeared to be an inflammatory polyp which was  photographed and removed using snare cautery technique, setting of 20-20  blended current, and a retroflexed view, no hemorrhoids seen.  The endoscope  was straightened and withdrawn.  The patient's vital signs and pulse  oximeter remained stable.  The patient tolerated the procedure well without  apparent complications.   FINDINGS:  1. Polyp at 15 cm from anal verge.  2. Internal hemorrhoids.  3. Diverticulosis of sigmoid colon.   PLAN:  1. Await biopsy report.  2. The patient will call me for results and follow up with me as an     outpatient.                                               Georgiana Spinner, M.D.    GMO/MEDQ  D:  07/12/2003  T:  07/12/2003  Job:  [id number]

## 2010-11-13 NOTE — Discharge Summary (Signed)
NAMEESTERA, OZIER               ACCOUNT NO.:  [id number]   MEDICAL RECORD NO.:  [id number]          PATIENT TYPE:  INP   LOCATION:  1405                         [facility]ACILITY:  [facility]   PHYSICIAN:  Jonna L. Robb Matar, M.D.DATE OF BIRTH:  14-Jan-1927   DATE OF ADMISSION:  05/21/2005  DATE OF DISCHARGE:  05/25/2005                                 DISCHARGE SUMMARY   PRIMARY CARE PHYSICIAN:  Henri Medal, M.D.   GASTROENTEROLOGIST:  Georgiana Spinner, M.D.   DISCHARGE DIAGNOSES:  1.  Gastrointestinal bleeding.  2.  Severe gastroesophageal reflux disease.  3.  Persistent nausea secondary to gastroesophageal reflux disease.  4.  Ischemic cardiomyopathy.  5.  Congestive heart failure.  6.  Obstructive sleep apnea.  7.  Degenerative joint disease.  8.  Mild dementia.  9.  Dyslipidemia.  10. Coronary artery disease.   ALLERGIES:  No known drug allergies.   CODE STATUS:  Full.   PROCEDURE:  EGD May 24, 2005, by Dr. Virginia Rochester.   STUDIES:  1.  HIDA scan shows patent cystic duct and no acute cholecystitis.  2.  CT of the abdomen and pelvis were remarkable only for diverticulosis,      degenerative lumbar spine changes.   HISTORY OF PRESENT ILLNESS:  This 77 year old assisted living patient  returns to the hospital 2 weeks after Klebsiella pyelonephritis, came with  continuous abdominal pain, epigastric, periumbilical, persistent nausea.  She had been well controlled on Nexium, but when she was changed to  Prilosec, she had inadequate therapy and her symptoms recurred.   PHYSICAL EXAMINATION:  Unremarkable.   LABORATORY DATA:  Hemoglobin 11.2.  Potassium 3.4.  Other labs were  unremarkable.   HOSPITAL COURSE:  The patient was put on increased PPI plus Carafate and was  continued on her other cardiac medicines.  She gradually improved.  EGD did  not show any significant changes and the patient's nausea gradually  subsided.   DISCHARGE MEDICATIONS:  1.  Klonopin 0.5 mg b.i.d.  2.  Colace 100 b.i.d.  3.  Carafate 1 gram a.c. and h.s.  4.  Norvasc 10 mg daily.  5.  Senokot S nightly.  6.  Plavix 75 mg daily.  7.  Lasix 40 mg daily.  8.  Potassium 20 mEq b.i.d.  9.  Lipitor 40 daily.  10. MiraLax 17 grams daily.  11. Toprol XL 75 mg daily.  12. Nexium 40 mg b.i.d.  This is a dispense as written, no substitutes.  13. Nasonex 50 mg spray each side daily.  14. Risperdal 0.5 nightly.  15. Avapro 150 daily.  16. Compazine 15 mg spansule p.o. daily.  17. Ultram 50 t.i.d. p.r.n.  18. Phenergan 25 mg q.6 hours p.r.n.   DIET:  She is to stay on a 2 gram salt diet, low cholesterol.   ACTIVITY:  She should get home health physical therapy at the assisted  living center.      Jonna L. Robb Matar, M.D.  Electronically Signed     JLB/MEDQ  D:  05/25/2005  T:  05/25/2005  Job:  [id number]   cc:  Henri Medal, MD  Fax: [phone]   Georgiana Spinner, M.D.  Fax: [phone]

## 2010-11-13 NOTE — H&P (Signed)
NAMEGOLDIE, TREGONING               ACCOUNT NO.:  [id number]   MEDICAL RECORD NO.:  [id number]          PATIENT TYPE:  EMS   LOCATION:  ED                           FACILITY:  [facility]   PHYSICIAN:  Lonia Blood, M.D.      DATE OF BIRTH:  1926-10-06   DATE OF ADMISSION:  05/21/2005  DATE OF DISCHARGE:                                HISTORY & PHYSICAL   PRIMARY CARE PHYSICIAN:  Ralene Ok, M.D.   PRESENTING COMPLAINT:  Severe abdominal pain, nausea.   HISTORY OF PRESENT ILLNESS:  The patient is a 77 year old with multiple  medical problems who was discharged on May 02, 2005 from [facility]  after admission with Klebsiella bacteremia and pyelonephritis.  She has  apparently been doing okay until now when she returns with continuous  abdominal pain that was getting worse last night.  Mainly in the epigastric  region, but also around the periumbilical area.  No fever this time around  but the patient has had nausea continuously.  Part of her evaluation in the  ER included guaiac study that showed guaiac-positive stools that was  apparently showing gross blood occasionally.  She was hemodynamically  stable.  However, due to this finding, she has been admitted at least for  observation and further work-up.   PAST MEDICAL HISTORY:  1.  Extensive and includes history of coronary artery disease.  The patient      had an IVUS-guided PCI of the AV groove circumflex stenosis by Dr. Allyson Sabal      on April 19, 2005.  On March 07, 2004, she had another      catheterization that showed noncritical coronary artery disease with an      EF of 60% as well as 80% proximal right renal artery stenosis.  This was      discovered on April 19, 2005.  She has history of ischemic      cardiomyopathy with some congestive heart failure.  Her EF is unknown.  2.  Recent acute renal failure that has resolved.  3.  Leukocytosis.  4.  GERD.  5.  Obstructive sleep apnea.  6.  Hypertension.  7.   Dyslipidemia.  8.  Degenerative joint disease.  9.  Chronic constipation.  10. History of colon polyps.  11. Internal hemorrhoids and diverticulosis according to Dr. Virginia Rochester in January      2005.  12. Bilateral cataracts.  13. Dementia.  14. History of frequent kidney stones.  15. History of right ankle fracture.  16. The patient is status post bladder tumor removal, status post bladder      tacking surgery.  17. Status post appendectomy.  18. Status post right breast lumpectomy.  19. Status post tonsillectomy.  20. Status post hysterectomy.  21. Status post back surgery three times.   MEDICATIONS:  1.  Aspirin 325 mg daily which we will hold.  2.  Protonix 40 mg at nightly.  3.  Claritin 10 mg daily.  4.  Ferrous sulfate 325 mg daily.  5.  Klonopin 0.5 mg twice a day.  6.  Colace 100  mg twice a day.  7.  Carafate 1 g q.a.c. and at nightly.  8.  Nasonex spray two sprays daily.  9.  Norvasc 10 mg daily.  10. Senokot-S one tablet nightly.  11. Plavix 75 mg daily.  12. Lasix 40 mg daily.  13. Potassium chloride 20 mEq daily.  14. Lipitor 10 mg daily.  15. MiraLax laxatives 17 g daily.  16. Ultram 50 mg three times a day.  17. Beano three times a day as needed.  18. Mylanta 14 mg q.a.c. and nightly.  19. Toprol-XL 75 mg daily.  20. Avapro 300 mg daily.   ALLERGIES:  The patient has no known drug allergies.   SOCIAL HISTORY:  The patient lives at [facility] assisted living  facility.  She had no children and has been widowed.  No history of alcohol  or tobacco use.  The patient uses a walker to move around.   FAMILY HISTORY:  Her mother died of rectal cancer.  Mother died of lung  cancer.  No siblings are sick.   REVIEW OF SYSTEMS:  A 10-point review of systems is performed.  Mainly  generalized aches and pains.  No weight gain or weight loss.  The patient  also has chronic nausea as per HPI.  She does have some dysphagia on and  off.  She has been baseline weakness  generally.  She is a very poor  historian.   PHYSICAL EXAMINATION:  VITAL SIGNS:  Temperature 98.5, blood pressure  initially 201/75 in the right arm, 187/70 on the left arm with a pulse of  78, respiratory rate 94.  GENERAL:  The patient looks withdrawn, depressed, but in no acute distress.  HEENT:  PERRL.  EOMI.  NECK:  Supple.  No JVD, no lymphadenopathy.  RESPIRATORY:  She has good air entry bilaterally.  No wheezes or rales.  CARDIOVASCULAR:  Regular rate and rhythm.  ABDOMEN:  Soft, nontender.  Positive bowel sounds.  EXTREMITIES:  No edema, cyanosis or clubbing.   LABORATORY DATA:  White count 4.9, hemoglobin 11.2, platelet count 261. No  left shift.  Sodium 138, potassium 3.4, chloride 102, glucose 114, BUN 8,  creatinine 1.1, calcium 9.4, total protein 6.8, albumin 3.4, AST 27, ALT 18,  alk phos 78, total bilirubin 0.7.  Lipase 29.  CT scan of the abdomen and  pelvis preliminary readings showed nothing acute.  Main findings are  diverticular disease mainly.   ASSESSMENT:  Therefore, this 77 year old female with known multiple medical  problems and prior knowledge of diverticular disease, polyps and internal  hemorrhoids, presenting with abdominal pain and nausea.  The patient has had  chronic nausea that has been going on for awhile, probably from her  gastroesophageal reflux disease or maybe gastroparesis.  However, her pain  today is diffuse, mainly in the epigastric region.  It looks like this may  be related to her gastroesophageal reflux disease.  She has severe  gastroesophageal reflux disease, taking proton pump inhibitors as well as  Mylanta.  There is no evidence to show that she had pancreatitis also at  this point.  Due to her guaiac-positive stool, we are admitting her mainly  to help reevaluate especially since the patient is on aspirin and Plavix.  Her hemoglobin so far looks okay and stable, but we will look at the trend. If it is not dropping rapidly, the  patient may need to continue on her  Plavix, aspirin and have close followup.   PLAN:  1.  Abdominal pain, nausea and vomiting.  I will admit the patient.  I will      double her PPI dose.  I will hold the aspirin for now. I will still keep      her on Plavix unless she continues to bleed.  Meanwhile, I will evaluate      her further because she has recent Klebsiella infection and      pyelonephritis.  Her urinalysis so far did not indicate acute infection.      She had no white count.  If her abdominal pain continues to be a      problem, she may require further evaluation especially of the biliary      tree including ERCP.  The patient may also benefit from some form of gallbladder surgery like  cholecystectomy if that is the case.  1.  Gastrointestinal bleed.  This most likely is diverticular bleed from the      polyps.  However, with her history of colon cancer in the family, the      fact that the patient has continued to have problems and symptoms, I      will get Dr. Virginia Rochester to see the patient.  2.  Obstructive sleep apnea. Will use CPAP at night for the patient.  3.  Hypertension.  Her blood pressure is seen to be well elevated but is      coming down gradually.  This may be related to the fact that she did not      take her medications this morning.  Will restart her home medications      and hopefully she will be able to keep it down.  If she keeps it down,      then we will gradually increase the medications to control her blood      pressure.  4.  History of congestive heart failure.  Will be careful when giving the      patient any fluid due to her history of congestive heart failure.  5.  Coronary artery disease.  It appears that the patient's symptoms are      more GI.  However, due to her history of coronary artery disease, will      still check her cardiac enzymes serially to make sure that we do not      miss anything.  6.  Other medical problems seem to be stable at this  point but we will      follow her closely and treat the patient accordingly if we notice any      change.      Lonia Blood, M.D.  Electronically Signed     LG/MEDQ  D:  05/21/2005  T:  05/21/2005  Job:  [id number]

## 2010-11-13 NOTE — Op Note (Signed)
NAMEVARINA, [name]               ACCOUNT NO.:  [id number]   MEDICAL RECORD NO.:  [id number]          PATIENT TYPE:  AMB   LOCATION:  ENDO                         FACILITY:  [facility]   PHYSICIAN:  Georgiana Spinner, M.D.    DATE OF BIRTH:  09-14-1926   DATE OF PROCEDURE:  08/19/2005  DATE OF DISCHARGE:                                 OPERATIVE REPORT   PROCEDURE:  Upper endoscopy.   INDICATIONS:  Abdominal pain.   ANESTHESIA:  Demerol 50, Versed 5 milligrams.   PROCEDURE:  With patient mildly sedated in the left lateral decubitus  position, the Olympus videoscopic endoscope was inserted in mouth, passed  under direct vision through the esophagus which appeared normal into the  stomach.  Fundus, body appeared normal. However, antrum, there was  approximately 1-1.5 cm polypoid lesion, possibly a mass extending from the  area the versus lesser curve anterior wall extending into the lumen of the  stomach. It was fairly erythematous more so than the surrounding tissue and  parts of the tissue when tested were fairly hard. There were also some  surrounding areas that were miniatures of this lesion also in the antrum.  These were all sampled with biopsy and we were able to get past this into  the pylorus, entered into the duodenal bulb and second portion duodenum both  appeared normal. From this point the endoscope was then slowly withdrawn  taking circumferential views of duodenal mucosa until the endoscope then  pulled back into the stomach placed in retroflexion to view the stomach from  below.  The endoscope was then straightened and withdrawn taking  circumferential views remaining gastric and esophageal mucosa. The patient's  vital signs, pulse oximeter remained stable. The patient tolerated procedure  well without apparent complication.   FINDINGS:  Fairly large lesion in the antrum as described above with  erythema and some hardness to the surface.  Biopsies were taken,  will await  biopsy report. The patient will call me for results and follow-up with me as  an outpatient.           ______________________________  Georgiana Spinner, M.D.     GMO/MEDQ  D:  08/19/2005  T:  08/20/2005  Job:  [id number]

## 2010-11-13 NOTE — Cardiovascular Report (Signed)
[name], [name]               ACCOUNT NO.:  [id number]   MEDICAL RECORD NO.:  [id number]          PATIENT TYPE:  OBV   LOCATION:  3707                         FACILITY:  [facility]   PHYSICIAN:  Cristy Hilts. Jacinto Halim, MD       DATE OF BIRTH:  05/09/1927   DATE OF PROCEDURE:  06/02/2006  DATE OF DISCHARGE:  06/03/2006                            CARDIAC CATHETERIZATION   ATTENDING CARDIOLOGIST:  Nanetta Batty.   PRIMARY CARE/ATTENDING PHYSICIAN:  Dr. Ralene Ok.   PROCEDURE PERFORMED:  1. Left ventriculography.  2. Selective left and right coronary arteriography.  3. Right femoral angiography and closure with right femoral artery      access with StarClose.   INDICATION:  [name] is a 77 year old female with history of  known coronary artery disease.  Had undergone cutting balloon  angioplasty to her obtuse marginal branch of the circumflex coronary  artery.  This was done in 2005.  She presented initially with chest  discomfort in September of 2006.  At that time, underwent cardiac  catheterization and revealed patent balloon, plain old balloon  angioplasty (POBA).  Site was widely patent and had mild luminal  irregularities of the LAD and circumflex.  She was readmitted to the  hospital with chest pain.  She had both typical and atypical qualities  for angina pectoris.  Her CPKs were elevated; however, her MB and  troponins were negative, myocardial injury.  However, because of the  fact that she could have recurrent admissions to the emergency room, she  was again brought back to the cardiac catheterization lab for diagnostic  reevaluation of her coronary anatomy to see for progression of coronary  disease, especially given her multiple cardiovascular risk factors that  include hypertension, hyperlipidemia, diabetes.   HEMODYNAMIC DATA:  The left ventricular pressure was 134/-1 with end-  diastolic pressure of 6 mmHg.  The aortic pressure was 170/64 with a  mean of 105  mmHg.  There was no pressure gradient across the aortic  valve.   ANGIOGRAPHIC DATA:  Left ventricle:  Left ventricular systolic function  was normal with an ejection fraction of 65%.  There was no significant  mitral regurgitation.   Right coronary artery:  The right coronary is a nondominant artery which  is normal.   Left main:  Left main is large , smooth and normal.   Circumflex:  Circumflex is a dominant vessel and is codominant with the  left anterior descending artery.  It gives origin to a small obtuse  marginal 1, and a very large obtuse marginal 2 which has an acute angle  takeoff.  The POBA site at this obtuse marginal is widely patent.  There  is a 30% stenosis in the distal circumflex.  Otherwise, circumflex is  smooth and normal.   LAD:  LAD is a large caliber vessel.  It has got mild luminal  irregularity after the origin of a small to moderate-sized diagonal one.  The LAD wraps around the apex and supplies a large part of the inferior  wall.  This is a type 3 LAD.  IMPRESSION:  No significant progression of coronary artery disease.  The  plain old balloon angioplasty site in the obtuse marginal is widely  patent.   RECOMMENDATION:  Evaluation for noncardiac cause of the chest pain is  indicated.  Patient will be discharged back to the nursing home in the  morning.   A total of 25 mL of contrast was utilized for diagnostic angiography  given her renal insufficiency.  She does have known renal artery  stenosis; however, renal arteries were not visualized, as there were no  plans for any revascularization at this point, and only medical therapy  was indicated by Dr. Nanetta Batty previously.   If patient has uncontrolled hypertension, consultation can be done for  renal angiography, possible angioplasty.  However, this can be decided  by Dr. Nanetta Batty.   TECHNICAL PROCEDURE:  Under usual sterile precautions, using a 6-French  multipurpose B2 catheter,  the catheter was advanced into the ascending  aorta over a 0.035-inch J-wire.  The catheter was gently advanced into  the left ventricle, and left ventricular pressures were monitored.  Hand  contrast injection of the left ventricle was performed in the RAO  projection.  The catheter was flushed with saline and pulled back into  the ascending aorta, and pressure gradient across the aortic valve was  monitored.  The right coronary artery was selectively engaged, and  angiography was performed.  Then the left main coronary was selectively  engaged, and angiography was performed.  Then the catheter was pulled  out of the body in the usual fashion.  Right femoral angiography was  performed through the arterial access sheath, and the access was closed  with StarClose with excellent hemostasis.  The patient tolerated the  procedure well.  No immediate complications were noted.      Cristy Hilts. Jacinto Halim, MD  Electronically Signed     JRG/MEDQ  D:  06/02/2006  T:  06/03/2006  Job:  [id number]   cc:   Ralene Ok, M.D.  Nanetta Batty, M.D.

## 2010-11-13 NOTE — H&P (Signed)
[name], [name]                           ACCOUNT NO.:  [id number]   MEDICAL RECORD NO.:  [id number]                   PATIENT TYPE:  INP   LOCATION:  5511                                 FACILITY:  [facility]   PHYSICIAN:  Erskine Speed, M.D.                DATE OF BIRTH:  02/08/1927   DATE OF ADMISSION:  09/08/2002  DATE OF DISCHARGE:                                HISTORY & PHYSICAL   PROBLEM:  Nausea and vomiting.   HISTORY OF PRESENT ILLNESS:  This 77 year old patient lives in [facility]  and has been sick for a month with low abdominal discomfort and nausea.  She  takes MiraLax for constipation.  Today she has had an increase in lower  abdominal discomfort, now with vomiting.  There have been no signs of GI  bleeding.  About three weeks ago the patient had some kind of surgical  procedure in the right lower quadrant, but I believe it may have been a  minor hernia repair or perhaps even just removal of an abdominal wall  lump, possibly a lipoma.  This was done by some doctor in [place].  Neither the patient nor the sister have any details about this.  The patient  was also complaining of chest pain, but it was a sharp pain in the ribcage  both right and left, with tenderness in the costochondral junctions.  There  was no anginal type of pain.   ALLERGIES:  None known.   PAST MEDICAL HISTORY:  The patient is status post total abdominal  hysterectomy and bladder tacking, but she was not sure how years ago this  was.   FAMILY HISTORY:  Noncontributory.   MEDICATIONS:  Listed on forms from the retirement center.   PHYSICAL EXAMINATION:  VITAL SIGNS:  Blood pressure 148/80, respirations are  16 and normal, temperature is 99.9, pulse 80 and regular.  HEENT:  Head was normal.  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  Oral exam and ENT exam revealed that in the left upper  gum line the patient had a tooth extraction in the last day or two with a  bloody socket although it was dried blood and appeared to be healing in the  normal fashion.  NECK:  Normal.  Carotids are 2+ without bruit.  Thyroid was negative.  The  neck was supple.  There were no masses.  CHEST:  Faint left basilar rales.  Ribs were tender across the costochondral  junctions.  BREASTS:  Normal.  CARDIOVASCULAR:  Rate and rhythm were normal.  Without murmur, thrill,  heave, rub, or gallop.  ABDOMEN:  Soft and flat.  There was mild tympanic percussion.  Bowel sounds  were present.  There was tenderness everywhere in the abdomen with nothing  specific.  There was no rebound pain, no abdominal bruit, no organomegaly or  abnormal masses.  PELVIC:  Deferred.  RECTAL:  Revealed absolutely no stool at all.  The small amount of rectal  contents was negative for occult blood.  NEUROLOGIC:  No focal abnormalities.  SKIN:  Unremarkable.   LABORATORY DATA:  Abdominal x-rays revealed ileus.  Chest x-ray showed no  active disease.   Urinalysis was normal.  Hemoglobin was 12, hematocrit was 35, white count  was 6600.  Potassium was 2.6, otherwise the CMET was normal.   EKG revealed nonspecific ST and T changes.   ASSESSMENT:  Nausea, vomiting, and ileus.  Low-grade fever.   PLAN:  The emergency room physician had ordered blood cultures and urine  cultures.  Amylase will be added.  The patient will be given IV fluids,  medicine for nausea, vomiting, and pain, then recheck  the electrolytes tomorrow.                                               Erskine Speed, M.D.    Balinda Quails  D:  09/08/2002  T:  09/09/2002  Job:  [id number]   cc:   Wilson Singer, M.D.  [address]  [place]  [place] [address]  Fax: [phone]

## 2010-11-13 NOTE — Op Note (Signed)
NAMETEIGHLOR, KORSON               ACCOUNT NO.:  [id number]   MEDICAL RECORD NO.:  [id number]          PATIENT TYPE:  AMB   LOCATION:  SDS                          FACILITY:  [facility]   PHYSICIAN:  Salley Scarlet., M.D.DATE OF BIRTH:  Nov 14, 1926   DATE OF PROCEDURE:  DATE OF DISCHARGE:  05/16/2008                               OPERATIVE REPORT   PREOPERATIVE DIAGNOSIS:  Immature cataract, right eye.   POSTOPERATIVE DIAGNOSIS:  Immature cataract, right eye.   OPERATION:  Kelman phacoemulsification of cataract, right eye.   ANESTHESIA:  Local using Xylocaine 2%, Marcaine, 0.75% with Wydase.   JUSTIFICATION OF PROCEDURE:  This is a 77 year old lady who has  retinitis pigmentosa, who complains of blurring of vision with  difficulty seeing to get around.  She was evaluated and found to have  bilateral posterior subcapsular cataracts with a visual acuity best  corrected at 20/60 each eye.  The nature of retinitis pigmentosa was  carefully explained to the patient.  The nature of cataracts were  explained to the patient.  We advised her that we can attempt to make  that even better by removing the cataract.  She agreed to cataract  surgery, and she is admitted at this time for that purpose.   PROCEDURE:  Under influence of IV sedation, a Van Lint akinesia and  retrobulbar anesthesia was given.  The patient was prepped and draped in  the usual manner.  The lid speculum was inserted under upper and lower  lid of the right eye, and a 4-0 silk traction suture was passed through  the belly of the superior rectus muscle retraction.  A fornix-based  conjunctival flap was turned, and hemostasis was achieved using cautery.  An incision made in the sclera to limbus.  This incision was dissected  down into clear cornea using a crescent blade.  A sideport incision was  made at 1:30 o'clock position.  OcuCoat was injected into the eye  through the sideport incision.  The anterior chamber was  entered through  the corneoscleral tunnel incision at 11:30 o'clock position.  An  anterior capsulotomy was done using a bent 25-gauge needle, and the  nucleus was emulsified without difficulty.  The KPE handpiece was passed  into the eye, and the nucleus was begun to be emulsified.  Near the end  of the emulsification, a tear was obtained in the posterior capsule and  vitreous presented itself in the wound.  The wound was then widened and  the remaining nucleus was manually expressed from the eye.  An anterior  vitrectomy was done, and the residual cortical material was aspirated.  An anterior chamber lens was seated across the iris, across the pupil,  and rotated in horizontal fashion.  At this point, a considerable amount  of bleeding was obtained.  The corneoscleral wound was closed by using  combination of interrupted sutures of 8-0 Vicryl and 10-0 nylon.  After  ascertaining that wound was airtight and watertight, the conjunctiva was  closed over wound using thermal cautery.  Celestone 1 mL and 0.5 mL of  gentamicin were injected subconjunctivally.  Maxitrol ophthalmic  ointment and atropine drops were applied along with a patch and Fox  shield.  The patient tolerated the procedure well and was discharged to  the postanesthesia recovery in satisfactory condition.  She is  instructed to rest today, to take Vicodin every 4 hours as needed for  pain, and to see me in my office tomorrow for evaluation.   DISCHARGE DIAGNOSES:  1. Immature cataract, right eye.  2. Retinitis pigmentosa.      Salley Scarlet., M.D.  Electronically Signed     TB/MEDQ  D:  05/18/2008  T:  05/19/2008  Job:  [id number]

## 2010-11-13 NOTE — Consult Note (Signed)
NAMEINFANTOF, VILLAGOMEZ               ACCOUNT NO.:  [id number]   MEDICAL RECORD NO.:  [id number]          PATIENT TYPE:  INP   LOCATION:  1405                         FACILITY:  [facility]   PHYSICIAN:  Iva Boop, M.D. LHCDATE OF BIRTH:  [birth year]   DATE OF CONSULTATION:  05/21/2005  DATE OF DISCHARGE:                                   CONSULTATION   PRIMARY CARE PHYSICIAN:  Dr. Ludwig Clarks.   GASTROENTEROLOGIST:  Dr. Virginia Rochester.  I am on call for Dr. Virginia Rochester.   ASSESSMENT:  Periumbilical and upper abdominal pain with heme-positive stool  and nausea.  The patient relates that these symptoms are similia to what she  was treated with Nexium for awhile ago.  Unfortunately, her insurance has  changed and she has been on Prilosec for the past few weeks.  I think this  may be an acid reflux mediated phenomenon and that she is on inadequate  therapy.   Regarding her heme-positive stool it could be her hemorrhoids.  A previous  colonoscopy in 2005 showed a polyp, which was removed, as well as  hemorrhoids and diverticulosis.  She was anemic but she was admitted with  pyelonephritis and had anemia related to that.  Her hemoglobin has climbed  from 9.2-11.6 since that time on April 30, 2005.  She currently has a HIDA  scan in process.  Those results are pending.  A computed tomogram of the  abdomen and pelvis with intravenous contrast has been unrevealing today.   RECOMMENDATIONS/PLAN:  1.  Supportive care.  2.  Start Nexium 40 mg twice daily.  3.  Continue Carafate.  4.  Follow up HIDA scan.  5.  She does have some intermittent dysphasia and given that and her      symptoms an EGD would be appropriate but she is on Plavix, which should      be held five-seven days before we attempted a dilation.  I do not think      the EGD is urgent.   She will follow up with Dr. Virginia Rochester on this unless we need to do something  sooner.   HISTORY:  A pleasant 77 year old black woman who was admitted with  pyelonephritis in early November and discharged.  At that point she went to  [facility] and was told her insurance does not cover  Nexium anymore and was put on Prilosec, the dose is not entirely clear to  me, as well as her Carafate.  She had been on Protonix in the past that did  not help and went through multiple different medication changes before she  resulted in relief with Nexium 40 mg daily and Carafate 1 g q.a.c. and h.s.  through Dr. Virginia Rochester.  She tells me years ago she had an upper endoscopy but I do  not have those results.  She says she is losing some weight and when she  eats she has pain and nausea.  She has chronic constipation.  She has not  had rectal bleeding, though she did have a Hemoccult-positive stool in the  emergency department.  DRUG ALLERGIES:  NONE KNOWN.   MEDICATIONS AT HOME:  1.  Aspirin.  2.  Claritin.  3.  Potassium chloride.  4.  Lipitor.  5.  Avapro.  6.  Prilosec.  7.  Iron sulfate.  8.  Klonopin.  9.  Colace.  10. Lasix.  11. MiraLax.  12. Toprol-XL.  13. Carafate.  14. Nasonex.  15. Plavix.  16. Ultram.  17. Mylanta (that she is taking before meals and it helps her swallow).  18. Senokot.   PAST MEDICAL HISTORY:  1.  Coronary artery disease with balloon-cutting angioplasty by Dr. Allyson Sabal in      2005.  2.  Renal artery stenosis.  3.  Ischemic cardiomyopathy and congestive heart failure.  4.  She had acute renal failure earlier this month.  5.  Gastroesophageal reflux disease.  6.  Obstructive sleep apnea.  7.  Nephrolithiasis.  8.  Right ankle fracture.  9.  Remote bladder tumor.  10. Bladder suspension surgery.  11. Dementia.  12. Cataracts.  13. Dyslipidemia.  14. Hypertension.  15. Colon polyps.  16. Diverticulitis.  17. Hemorrhoids.   SOCIAL HISTORY:  She lives in a nursing home.  No alcohol or tobacco.   FAMILY HISTORY:  Mother died of carcinoma of the rectum.  There is a history  of lung cancer in the  family.   REVIEW OF SYSTEMS:  She ambulates with a walker.  Multiple joint pains.  Weight loss as above.  She denies fever or chills.  She has not vomited.   PHYSICAL EXAMINATION:  GENERAL:  A pleasant elderly black woman in no acute  distress.  VITAL SIGNS:  Temperature 98.6, pulse 74, respirations 20, blood pressure  187/79.  HEENT:  Eyes anicteric.  Mouth free of lesions.  NECK:  Supple.  CHEST:  Clear.  HEART:  S1 and S2.  ABDOMEN:  Soft, obese.  Bowel sounds present.  Tender to a mild degree in  the periumbilical and right upper quadrant.  No rebound.  NEUROLOGIC:  She is alert and oriented x3.   LABORATORY DATA:  Hemoglobin 11.6, hematocrit 35, white count 4.9, platelets  261.  B-MET is normal.  Cardiac enzymes negative.  UA negative.  LFTs  normal.  Albumin 3.4.   Her HIDA scan did turn out to show no evidence of cholecystitis.  It does  not look like that the bile duct emptied into the intestine.  The meaning of  that is not entirely clear at this time.  Await final report.   I appreciate the opportunity to participate in the care of this patient.      Iva Boop, M.D. [facility]  Electronically Signed     CEG/MEDQ  D:  05/21/2005  T:  05/21/2005  Job:  [id number]   cc:   Georgiana Spinner, M.D.  Fax: [phone]   Ralene Ok, M.D.  Fax: [phone]

## 2010-11-13 NOTE — Cardiovascular Report (Signed)
[name], [name]               ACCOUNT NO.:  [id number]   MEDICAL RECORD NO.:  [id number]          PATIENT TYPE:  INP   LOCATION:  3731                         FACILITY:  [facility]   PHYSICIAN:  Darlin Priestly, MD  DATE OF BIRTH:  08/12/1926   DATE OF PROCEDURE:  04/19/2005  DATE OF DISCHARGE:                              CARDIAC CATHETERIZATION   PROCEDURES PERFORMED:  1.  Left heart catheterization.  2.  Coronary angiography.  3.  Left ventriculogram.  4.  Abdominal aortogram.  5.  Right renal angiogram.   ATTENDING:  Darlin Priestly, M.D.   COMPLICATIONS:  None.   INDICATIONS:  [name] is a 77 year old female history of Dr. Ludwig Clarks and  Dr. Allyson Sabal with a history of hypertension, history of CAD status post cutting  balloon angioplasty of AV groove circumflex in September of 2005 who was  recently admitted with recurrent chest pain.  She is now brought for repeat  catheterization to reassess her coronary status.   DESCRIPTION OF OPERATION:  After giving informed written consent, patient  brought to the cardiac catheterization laboratory.  Right and left groin  shaved, prepped, and draped in usual sterile fashion.  ECG monitor  established.  Using a modified Seldinger technique, a number 6-French  arterial sheath inserted in right femoral artery.  A 6-French diagnostic  catheter was then used to perform diagnostic angiography.   The left main is a large vessel with no significant disease.   The LAD is a large vessel which wraps around the apex, gives rise to two  diagonal branches.  The LAD has mild 20% irregularities in the proximal  portion.  The remainder of the LAD has irregularities, but no significant  disease.   The first and second diagonal is a medium sized vessel with no significant  disease.   Left circumflex is a large vessel which is dominant.  Gives rise to one  obtuse marginal as well as PDA.  AV groove circumflex has 50% lesion prior  to the  takeoff of the first OM as well as 50% lesion after the bifurcation  of the first OM.  There is 50% more distal AV groove circumflex lesion.   The first OM was a large vessel which bifurcates in the mid segment with 30%  ostial lesion.  The PDA is a medium sized vessel with no significant  disease.   The right coronary artery is a small, nondominant vessel with no significant  disease.   Left ventriculogram reveals preserved EF at 60%.   Abdominal aortogram reveals 80% estimated right renal artery stenosis.   Selected right renal angiogram reveals 80% proximal stenosis.   HEMODYNAMICS:  Systemic arterial pressure 165/71, LV systemic pressure  165/7, LVEDP of 14.   CONCLUSION:  1.  Noncritical coronary artery disease.  2.  Normal left ventricular systolic function.  3.  80% proximal right renal artery stenosis.  4.  Systemic hypertension.      Darlin Priestly, MD  Electronically Signed     RHM/MEDQ  D:  04/19/2005  T:  04/19/2005  Job:  [id number]  cc:   Ralene Ok, M.D.  Fax: [phone]   Nanetta Batty, M.D.  Fax: [phone]

## 2010-11-13 NOTE — H&P (Signed)
NAMECLEOTA, PELLERITO               ACCOUNT NO.:  [id number]   MEDICAL RECORD NO.:  [id number]          PATIENT TYPE:  EMS   LOCATION:  MAJO                         FACILITY:  [facility]   PHYSICIAN:  Jonna L. Robb Matar, M.D.DATE OF BIRTH:  June 08, 1927   DATE OF ADMISSION:  04/16/2005  DATE OF DISCHARGE:                                HISTORY & PHYSICAL   PRIMARY CARE PHYSICIAN:  Dr. Ludwig Clarks   CARDIOLOGIST:  Dr. Allyson Sabal   CHIEF COMPLAINT:  Chest pain.   HISTORY:  This 77 year old African-American female complained of relatively  localized left upper chest pain that was fairly severe and then radiated  into her throat.  It started at 3:00 in the morning and she had been getting  flurries with it which she describes as a few minutes of a rapid heartbeat.  The chest pain itself has cleared.  She has a history of coronary artery  disease and supposedly had a stent back last year.  She has a history of  congestive heart failure.  Risk factors include hypertension,  hyperlipidemia.   ALLERGIES:  None.   MEDICATIONS:  1.  Lasix 40.  2.  Aspirin 81.  3.  Potassium 20.  4.  Norvasc 10.  5.  Plavix 75.  6.  Cozaar 50.  7.  Inderal 40.  8.  Lipitor 10.  9.  Flonase b.i.d.  10. Maalox q.i.d.  11. Carafate q.i.d.  12. MiraLax every other day.  13. Risperdal 0.5 at bedtime.  14. Restoril 7.5 at bedtime.  15. Claritin 10.  16. Hemocyte daily.  17. Prilosec 20.  18. Celebrex 200.  19. Klonopin 0.5 b.i.d.  20. Colace 100 b.i.d.  21. Catapres 0.1 b.i.d.  22. Phenergan 12.5 b.i.d.  23. Pepcid 20 b.i.d.   PAST MEDICAL HISTORY:  1.  Other than cardiac she has allergic rhinitis.  2.  Obstructive sleep apnea for which she uses CPAP at night.  3.  Fairly extensive osteoarthritis for which she has been on Celebrex.  4.  Chronic constipation.  5.  GERD that is quite severe.   OPERATION:  1.  Bladder tumor removal.  2.  Bladder tack.  3.  Right ankle fracture.  4.  Appendectomy.  5.   Right breast lumpectomy.  6.  Tonsillectomy.  7.  Hysterectomy.  8.  Three disk surgeries in her back.   FAMILY HISTORY:  MI, CVA, lung cancer, diabetes.   SOCIAL HISTORY:  Nonsmoker.  Nondrinker.  No drugs.  She has no children but  she has a brother and a sister.  She had a hospitalization and after that  did not want to go home or I gather she did not get along with her husband  so she has moved into assisted living instead.   REVIEW OF SYSTEMS:  Patient complains of chronic dyspnea, especially at  bedtime.  I think what she is describing is her sleep apnea.  She was taking  MiraLax at one point every day but she was told to take it every other day.  She has had a right kidney stone.  No fever.  She does have cataracts but  they are not bad enough to get removed yet.  She frequently has snoring and  insomnia when she does not wear the CPAP.  No coughing or wheezing.  Occasional mild swelling in her feet and hands.  No nausea, vomiting,  diarrhea, hematemesis, or melena.  No hematuria.  No breast masses or  tenderness since her surgery.  She gets occasional pain right along where  the incision is.  No diabetes or thyroid problems.  No skin changes.  She  has insomnia   PHYSICAL EXAMINATION:  VITAL SIGNS:  98, 60, 18, 188/86.  GENERAL:  This is a morbidly overweight African-American female sitting up  in bed.  HEENT:  Conjunctivae and lids are normal.  Pupils reactive.  Extraocular  movements are full.  She has normal hearing, poor dentition.  No thyromegaly  or carotid bruits.  RESPIRATORY:  Effort is normal.  LUNGS:  Clear to P&A without wheezing, rales, rhonchi, or dullness.  HEART:  Regular rate and rhythm.  Normal S1, S2 without murmurs, rubs, or  gallops.  There are no carotid bruits.  No cyanosis or clubbing.  There is a  little bit of trace edema.  BREASTS:  The right breast has a scar, but no masses.  Left breast is  normal.  ABDOMEN:  Nontender.  Normal bowel sounds.   Lower midline abdominal scar.  No hepatosplenomegaly or hernia.  GENITOURINARY:  Normal external genitalia.  No adenopathy.  EXTREMITIES:  Muscle strength 5/5 with full range of motion.  She has  osteoarthritic changes, trace peripheral edema.  NEUROLOGIC:  Sensation is normal.  She is alert and oriented.  Normal  memory, judgment, and affect.   LABORATORY DATA:  Chest x-ray shows cardiomegaly.  CBC, BMP, D-dimer,  cardiac enzymes, BNP all normal.  EKG shows sinus bradycardia.   IMPRESSION:  1.  Chest pain.  Patient was ruled out for a myocardial infarction.  I have      notified her cardiologist to see if any further testing needs to be      done.  2.  Severe gastroesophageal reflux disease.  I think that he is going to      take her off the Celebrex which probably is not good for people with      heart disease anyway and switch her to Ultram.  3.  Osteoarthritis, degenerative disk disease.  4.  Obstructive sleep apnea.  Continue her CPAP.  5.  Chronic constipation.  6.  Hypertension.  7.  Hyperlipidemia.      Jonna L. Robb Matar, M.D.  Electronically Signed     JLB/MEDQ  D:  04/16/2005  T:  04/16/2005  Job:  [id number]   cc:   Ralene Ok, M.D.  Fax: [phone]   Nanetta Batty, M.D.  Fax: [phone]

## 2010-11-13 NOTE — Cardiovascular Report (Signed)
[name], [name] NO.:  [id number]   MEDICAL RECORD NO.:  [id number]          PATIENT TYPE:  EMS   LOCATION:  MAJO                         FACILITY:  [facility]   PHYSICIAN:  Nanetta Batty, M.D.   DATE OF BIRTH:  [birth year]   DATE OF PROCEDURE:  03/07/2004  DATE OF DISCHARGE:  03/07/2004                              CARDIAC CATHETERIZATION   CLINICAL HISTORY:  [name] is a 77 year old African-American female sent  to me for evaluation of palpitations and chest pain.  She did have a  Cardiolite that showed inferolateral ischemia.  She underwent diagnostic  catheterization at [facility] revealing a high grade AV groove  circumflex stenosis in a dominant vessel seen best in the caudal view.  She  presents now for IVUS guided PCI.   PROCEDURE DESCRIPTION:  The patient was brought to the second floor [facility] in the postabsorptive state.  She was  premedicated with p.o. Valium as well as IV contrast prophylaxis.  She was  on aspirin and Plavix.  Now patient received additional 300 mg of p.o.  Plavix.  Visipaque dye was used for the entirety of the case.  Retrograde  aortic pressures was monitored during the case.  Patient received Angiomax  bolus and infusion with an ACT measured at greater than 350.   A 7-French sheath was inserted into the right femoral artery using standard  Seldinger technique.  A 6-French sheath was inserted into the right femoral  vein.  Using a 7-French JL4 guide catheter along with an OM4 190 Asahi soft  wire and an IVUS catheter (Galaxy device) intravascular ultrasound was  performed.  The distal reference segment measured 3.2 x 3.2.  The lesion  which appeared to be a napkin ring lesion just at the takeoff to the  marginal branch measured 1.7 x 1.8 mm.   Following this a 2.75 x 6 cutter was then used to perform atherectomy at the  ostium of the AV groove circumflex after the takeoff of the  marginal branch.  200 mcg of intracoronary nitroglycerin was then administered.  A second  cutting balloon atherectomy was performed with a 3.0 x 6 mm cutter resulting  in reduction of what appeared to be a 90% lesion angiographically to less  than 50%.  There was some recoil noted and the lesion was eccentric on IVUS  interrogation.  There was no obvious dissection and there was TIMI 3 flow.  The patient had no electrocardiographic, hemodynamic, or clinical sequela  during balloon inflation.   IMPRESSION:  Successful, though suboptimal cutting balloon atherectomy of an  AV groove circumflex in a dominant vessel after takeoff of a large marginal  branch.  Patient tolerated the procedure well.  Guidewire and catheters were  removed.  The sheaths were sewn securely in place.  Angiomax infusion was  turned off.  Plans will  be to discontinue the sheaths in two hours.  Patient will be continued on  aspirin and Plavix and will be discharged home in the morning.  If she has  recurrent symptoms and/or clinical restenosis she  will most likely require  bifurcation stenting using 3-0 x 8 mm long Cypher drug-eluting stents.      JB/MEDQ  D:  06/18/2004  T:  06/18/2004  Job:  [id number]   cc:   Second [facility]   Ralene Ok, M.D.  [address]  [place]  [place] [address]  Fax: [phone]   [facility]  [address] [place]  [place] [address]

## 2010-11-13 NOTE — Op Note (Signed)
[name], [name] NO.:  [id number]   MEDICAL RECORD NO.:  [id number]          PATIENT TYPE:  INP   LOCATION:  1405                         [facility]ACILITY:  [facility]   PHYSICIAN:  Georgiana Spinner, M.D.    DATE OF BIRTH:  Feb 22, 1927   DATE OF PROCEDURE:  05/24/2005  DATE OF DISCHARGE:                                 OPERATIVE REPORT   PROCEDURE:  Upper endoscopy.   ENDOSCOPIST:  Georgiana Spinner, M.D.   INDICATIONS:  Abdominal pain.  Hemoccult-positive stool.   ANESTHESIA:  Demerol 60, Versed 6 mg.   DESCRIPTION OF PROCEDURE:  With the patient mildly sedated in the left  lateral decubitus position, the Olympus videoscopic endoscope was inserted  in the mouth and passed under direct vision through the esophagus which  appeared normal into the stomach.  The fundus, body, antrum, duodenal bulb,  second portion duodenum were visualized.  From this point, the endoscope was  slowly withdrawn taking circumferential views of duodenal mucosa, stopping  to photograph the duodenal bulb and then pulling back into the stomach,  placing the endoscope in retroflexion to view the stomach from below.  The  endoscope was straightened and withdrawn taking circumferential views of the  remaining gastric and esophageal mucosa.  The patient's vital signs, pulse  oximeter remained stable.  The patient tolerated procedure well without  apparent complications.   FINDINGS:  Rather unremarkable endoscopic examination.   PLAN:  Follow the patient's clinical course.  Consider further outpatient  followup if necessary and discharge if the patient remains stable.           ______________________________  Georgiana Spinner, M.D.     GMO/MEDQ  D:  05/24/2005  T:  05/24/2005  Job:  [id number]   cc:   Ralene Ok, M.D.  Fax: [phone]

## 2010-11-13 NOTE — Discharge Summary (Signed)
   NAMELEYDY, WORTHEY                           ACCOUNT NO.:  [id number]   MEDICAL RECORD NO.:  [id number]                   PATIENT TYPE:  INP   LOCATION:  5511                                 FACILITY:  [facility]   PHYSICIAN:  Wilson Singer, M.D.             DATE OF BIRTH:  October 23, 1926   DATE OF ADMISSION:  09/08/2002  DATE OF DISCHARGE:                                 DISCHARGE SUMMARY   FINAL DIAGNOSES:  1. Dementia.  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  3. Right ankle fracture in December 2002.  4. Hypertension.  5. Gastritis.  6. Peptic ulcer.   REASON FOR ADMISSION:  She came in on September 08, 2002, with a diagnosis of  nausea and vomiting. She is a resident at [facility].   HOSPITAL COURSE:  She did not have any vomiting during admission. She was  started on IV fluids at 100 mL an hour to which she had Phenergan 12.5 mg IV  q.4h. p.r.n. nausea and vomiting. The abdominal ultrasound that was done   Dictation ended at this point.     Aurelio Brash, N.P.             Wilson Singer, M.D.    CKV/MEDQ  D:  09/12/2002  T:  09/12/2002  Job:  [id number]

## 2010-11-13 NOTE — H&P (Signed)
[name], [name]               ACCOUNT NO.:  [id number]   MEDICAL RECORD NO.:  [id number]          PATIENT TYPE:  INP   LOCATION:  6527                         FACILITY:  [facility]   PHYSICIAN:  Elliot Cousin, M.D.    DATE OF BIRTH:  1926-11-16   DATE OF ADMISSION:  04/28/2005  DATE OF DISCHARGE:                                HISTORY & PHYSICAL   PRIMARY CARE PHYSICIAN:  Ralene Ok, M.D.   CARDIOLOGIST:  Nanetta Batty, M.D.   CHIEF COMPLAINT:  Abdominal pain, pain with urination, subjective fever and  chills.   HISTORY OF PRESENT ILLNESS:  The patient is a 77 year old lady with a past  medical history significant for coronary artery disease, renal artery  stenosis, peptic ulcer disease, and obstructive sleep apnea, who presents to  the emergency department with a chief complaint of abdominal pain, pain with  urination, and fever and chills.  The patient was just discharged from the  hospital on April 20, 2005 for evaluation of chest pain.  During the  hospitalization last week, the patient underwent a cardiac catheterization  which revealed non-critical coronary artery disease.  She states that during  the hospitalization, she had no complaints of abdominal pain or pain with  urination.  Over the past 2 days, however, she complains of diffuse  abdominal pain and pain with urination.  No specific flank pain, although  the patient says that she has a history of a right kidney stone which  sometimes acts up.  She has had some nausea, but no vomiting.  She admits  that her nausea is chronic. She has had several loose stools over the past  two days. No grossly bloody stools, melena, or hematemesis. She complains of  feeling tired.  She is hot one minute and cold the next minute.  She also  has some intermittent chest pain which comes and goes.  Per her  recollection, she did not have an indwelling Foley catheter during the  hospitalization last week.   When the patient was  evaluated in the emergency department, her laboratory  data were significant for a WBC of 22.5, creatinine of 1.8, and an  urinalysis which revealed large leukocyte esterase, 21-50 WBCs, and many  bacteria.  The patient will, therefore, be admitted for further evaluation  and management.   PAST MEDICAL HISTORY:  1.  Coronary artery disease.  On March 07, 2004, the patient underwent      an IVUS-guided PCI of the AV groove circumflex stenosis per Dr. Allyson Sabal.      On April 19, 2005, the patient underwent another cardiac      catheterization which revealed non-critical coronary artery disease, an      ejection fraction of 60%, and an 80% proximal right renal artery      stenosis.      1.  History of congestive heart failure.  2.  Renal artery stenosis, as above.  3.  History of peptic ulcer disease.  4.  Gastroesophageal reflux disease with esophageal spasms.  5.  Obstructive sleep apnea on CPAP.  6.  Hypertension.  7.  Hyperlipidemia.  8.  Degenerative joint disease.  9.  Chronic constipation.  10. Colon polyps, internal hemorrhoids, and diverticulosis per colonoscopy      by Dr. Virginia Rochester in January of 2005.  11. Bilateral cataracts.  12. Dementia.  13. Question history of kidney stones.  14. History of right ankle fracture.  15. Status post bladder tumor removal.  16. Status post bladder tacking surgery.  17. Status post appendectomy.  18. Status post right breast lumpectomy.  19. Status post tonsillectomy.  20. Status post hysterectomy.  21. Status post back surgeries x 3.   MEDICATIONS:  1.  Claritin 10 mg daily.  2.  Ferrous sulfate 325 mg daily.  3.  Klonopin 0.5 mg b.i.d.  4.  Colace 100 mg b.i.d.  5.  Clonidine 0.1 mg nightly.  6.  Carafate 1 gm q.a.c. and nightly.  7.  Risperdal 0.5 mg nightly.  8.  Nasonex spray two sprays daily.  9.  Protonix 40 mg q.a.m. and nightly.  10. Norvasc 10 mg daily.  11. Senokot S nightly.  12. Plavix 75 mg daily.  13. Lasix 40 mg  daily.  14. Potassium chloride 20 mEq daily.  15. Lipitor 10 mg daily.  16. MiraLax laxative 17 gm in favorite beverage daily.  17. Ultram 50 mg t.i.d. p.r.n.  18. Beano t.i.d. p.r.n.  19. Mylanta 30 cc q.a.c. and nightly.  20. Enteric-coated aspirin 325 mg daily.  21. Toprol-XL 75 mg daily.  22. Avapro 300 mg daily.   ALLERGIES:  No known drug allergies.   SOCIAL HISTORY:  The patient is a resident of [facility].  She has been there approximately 2 years.  She has no children.  She is  widowed.  She denies alcohol, tobacco, and drug use.  She ambulates  primarily with a walker.   FAMILY HISTORY:  The patient's mother died of rectal cancer.  Her father  died of lung cancer.   REVIEW OF SYSTEMS:  The patient's review of systems is positive for  arthritic pain in her shoulders, her knees, and her legs.  She has chronic  nausea, chronic abdominal pain, occasional difficulty swallowing, chronic  shortness of breath, difficulty seeing.  She has had several loose stools  over the past few days.  Otherwise, review of systems is negative.   PHYSICAL EXAMINATION:  VITAL SIGNS:  Temperature 102.8, blood pressure  131/49, pulse 101, respiratory rate 30, oxygen saturation 95% on room air.  GENERAL:  The patient is an alert, obese, 77 year old African-American woman  who is currently lying in bed in no acute distress.  HEENT:  Normocephalic and atraumatic.  Pupils are minimally reactive to  light and round.  Extraocular movements are intact.  Conjunctivae are clear.  Sclerae are white.  Tympanic membranes are obscured by cerumen bilaterally.  Nasal mucosa is mildly dry.  No sinus tenderness.  Oropharynx reveals  multiple missing teeth.  Mucous membranes are dry.  No posterior exudates or  erythema.  NECK:  Supple.  No adenopathy, no thyromegaly, no bruit, no JVD. LUNGS:  Clear to auscultation bilaterally with the exception of decreased  breath sounds in the bases.   HEART:  S1 and S2 with a soft systolic murmur.  ABDOMEN:  Obese, positive bowel sounds.  Mildly tender diffusely.  No  rebound or guarding.  No distension.  No flank tenderness bilaterally.  GU:  The patient has an indwelling Foley catheter draining dark yellow  urine.  RECTAL:  Deferred.  EXTREMITIES:  The patient has diffuse arthritic changes in her knees, her  feet, and her hands bilaterally.  Pedal pulses are barely palpable  bilaterally.  No pretibial edema, no pedal edema.  The patient is able to  raise her legs above gravity at approximately 10 degrees.  She has good  range of motion of her upper extremities.  No acute joint abnormalities.  NEUROLOGIC:  The patient is alert and oriented x2.  Cranial nerves II-XII  are grossly intact.  Sensation is intact.  Strength is 5-/5 of the lower  extremities and 5/5 of the upper extremities.   ADMISSION LABORATORIES:  EKG revealed normal sinus rhythm with a heart rate  of 99 beats per minute.  Prolonged QT with a QT of 363 and a QTC of 466.  Nonspecific T wave abnormalities.  Acute abdominal series reveals normal  bowel gas pattern.  No acute findings.  Stable cardiomegaly.  No acute  cardiopulmonary disease.  Sodium 133, potassium 3.6, chloride 98, CO2 of 26,  glucose 175, BUN 17, creatinine 1.8, calcium 8.7, total protein 6.9, albumin  3.4.  AST 36, ALT 36.  Alkaline phosphatase 70, total bilirubin 0.9.  WBC  22.5, absolute neutrophil count 20.9, hemoglobin 11.2, hematocrit 33.6,  platelets 282.  Urinalysis - urine appearance turbid, urine hemoglobin  moderate, urine protein 100, urine nitrite positive, urine leukocyte  esterase large, urine WBCs 21-50, urine RBCs 3-6, urine bacteria many.   ASSESSMENT:  1.  Abdominal pain with dysuria.  The patient's symptoms are consistent with      an urinary tract infection.  2.  Fever/chills/leukocytosis.  These indices are more than likely secondary      to the urinary tract infection.  3.   Recent history of diarrhea.  The diarrhea more or less has resolved per      patient.  No recent antibiotics.  She does take multiple laxatives      secondary to chronic constipation.  4.  Renal insufficiency.  The patient's BUN is 15, and her creatinine is 1.8      tonight.  As a comparison, her creatinine was 1.1 on April 20, 2005.      The patient did undergo a cardiac catheterization last week.  5.  Status post left EJ central line per Dr. Ethelda Chick in the emergency      department.   PLAN:  1.  The patient will be admitted for further evaluation and management.  2.  Will admit to telemetry.  If no acute arrhythmias over the next 24      hours, telemetry can be discontinued.  3.  Will check lipase.  Will also order a renal/abdominal ultrasound for      evaluation of pain and history of kidney stones.  4.  Blood cultures have been ordered.  Will also order an urine culture. 5.  Will start antibiotic treatment with Cipro 400 mg IV q.12h.  6.  Gentle volume repletion.  Hold Lasix and Avapro for 24 hours, and then      restart if the patient's renal function improves.  7.  Will collect stool specimens to rule out Clostridium difficile.  Hold      MiraLax for now.      Elliot Cousin, M.D.  Electronically Signed     DF/MEDQ  D:  04/28/2005  T:  04/28/2005  Job:  [id number]   cc:   Ralene Ok, M.D.  Fax: [phone]   Nanetta Batty, M.D.  Fax: [phone]

## 2010-11-13 NOTE — Discharge Summary (Signed)
NAMEDAWNN, NAM               ACCOUNT NO.:  [id number]   MEDICAL RECORD NO.:  [id number]          PATIENT TYPE:  OBV   LOCATION:  3707                         FACILITY:  [facility]   PHYSICIAN:  Nanetta Batty, M.D.   DATE OF BIRTH:  20-Jul-1926   DATE OF ADMISSION:  06/01/2006  DATE OF DISCHARGE:  06/03/2006                               DISCHARGE SUMMARY   DISCHARGE DIAGNOSES:  1. Chest pain, consistent with unstable angina.  Catheterization this      admission revealing nonobstructive coronary disease with good left      ventricular function.  2. History of coronary disease with circumflex cutting balloon      intervention in September of 2005.  3. Renal artery stenosis with previous 80% right renal artery      stenosis.  4. Treated hypertension.  5. Sleep apnea on CPAP.  6. Mild dementia.   HOSPITAL COURSE:  The patient is a 77 year old female who is a resident  of [facility].  She presented with a 1 week history of chest pain.  She does have a past history of coronary disease.  Her symptoms were  worrisome for unstable angina, she did have some radiation into her jaw  and left arm.  She was admitted to Telemetry.  The CK MBs, troponins  were negative.  She underwent diagnostic catheterization on June 02, 2006.  This revealed a small nondominant RCA without significant  disease.  Normal left main.  A 60% narrowing in the LAD after the first  diagonal.  Patent circumflex intervention site in 2005.  The EF was 65%.  The patient tolerated procedure well.  We feel she can be discharged  June 03, 2006.   DISCHARGE MEDICATIONS:  1. Lotensin 10 mg a day.  2. Norvasc 5 mg a day.  3. Klonopin 0.5 mg a day.  4. Colace 100 mg b.i.d.  5. Flonase b.i.d.  6. Cardizem 60 mg b.i.d.  7. Carafate 1 g q.i.d.  8. Plavix 75 mg a day.  9. Lasix 60 mg a day.  10.Lipitor 40 mg a day.  11.MiraLax daily.  12.Claritin 10 mg a day.  13.KCL 20 mEq a day.  14.Metoprolol 100 mg a day.  15.Avapro 300 mg a day.  16.Celebrex 200 mg a day.  17.Imdur 30 mg a day.  18.Zetia 10 mg a day.  19.TriCor __________ mg a day.  20.Celexa 20 mg a day.  21.Aldactone 25 mg a day.   DISCHARGE LABS:  White count 6.3, hemoglobin 11.7, hematocrit 35,  platelets 316,000.  Sodium 139, potassium 4.4, BUN 13, creatinine 1.3.  CKs were elevated at 429 and 396 but her MB fraction was negative and  troponins were negative x2.  The LDL was 54, HDL 42.  Chest x-ray shows  cardiomegaly with right basilar scarring.  EKG show sinus rhythm without  acute changes.   DISPOSITION:  The patient is discharged in stable condition and will  follow up with Dr. Allyson Sabal in a couple of weeks as an outpatient.      Abelino Derrick, P.A.  Nanetta Batty, M.D.  Electronically Signed    LKK/MEDQ  D:  06/03/2006  T:  06/03/2006  Job:  [id number]   cc:   Nanetta Batty, M.D.  Ralene Ok, M.D.

## 2010-11-13 NOTE — Op Note (Signed)
NAMEPIERRE, [name]               ACCOUNT NO.:  [id number]   MEDICAL RECORD NO.:  [id number]          PATIENT TYPE:  AMB   LOCATION:  ENDO                         FACILITY:  [facility]   PHYSICIAN:  Georgiana Spinner, M.D.    DATE OF BIRTH:  11-13-1926   DATE OF PROCEDURE:  08/19/2005  DATE OF DISCHARGE:                                 OPERATIVE REPORT   PROCEDURE:  Upper endoscopy.   INDICATIONS:  Abdominal pain.   ANESTHESIA:  Demerol 50, Versed 5 milligrams.   PROCEDURE:  With patient mildly sedated in the left lateral decubitus  position, the Olympus videoscopic endoscope was inserted in mouth, passed  under direct vision through the esophagus which appeared normal into the  stomach.  Fundus, body appeared normal. However, antrum, there was  approximately 1-1.5 cm polypoid lesion, possibly a mass extending from the  area the versus lesser curve anterior wall extending into the lumen of the  stomach. It was fairly erythematous more so than the surrounding tissue and  parts of the tissue when tested were fairly hard. There were also some  surrounding areas that were miniatures of this lesion also in the antrum.  These were all sampled with biopsy and we were able to get past this into  the pylorus, entered into the duodenal bulb and second portion duodenum both  appeared normal. From this point the endoscope was then slowly withdrawn  taking circumferential views of duodenal mucosa until the endoscope then  pulled back into the stomach placed in retroflexion to view the stomach from  below.  The endoscope was then straightened and withdrawn taking  circumferential views remaining gastric and esophageal mucosa. The patient's  vital signs, pulse oximeter remained stable. The patient tolerated procedure  well without apparent complication.   FINDINGS:  Fairly large lesion in the antrum as described above with  erythema and some hardness to the surface.  Biopsies were taken,  will await  biopsy report. The patient will call me for results and follow-up with me as  an outpatient.           ______________________________  Georgiana Spinner, M.D.     GMO/MEDQ  D:  08/19/2005  T:  08/20/2005  Job:  [id number]   cc:   Ralene Ok, M.D.  Fax: [phone]

## 2010-11-13 NOTE — Discharge Summary (Signed)
[name], [name]                           ACCOUNT NO.:  [id number]   MEDICAL RECORD NO.:  [id number]                   PATIENT TYPE:  INP   LOCATION:  5511                                 FACILITY:  [facility]   PHYSICIAN:  Wilson Singer, M.D.             DATE OF BIRTH:  Oct 22, 1926   DATE OF ADMISSION:  09/08/2002  DATE OF DISCHARGE:                                 DISCHARGE SUMMARY   FINAL DIAGNOSES:  1. Ileus.  2. Hypokalemia.   PAST MEDICAL HISTORY:  1. Dementia with psychosis.  2. Right ankle fracture from December 2002.  3. Hypertension.  4. Gastritis.  5. Peptic ulcer disease.   REASON FOR ADMISSION:  Nausea and vomiting.   HOSPITAL COURSE:  This 77 year old, resident of [facility]  presented on September 08, 2002 with nausea and vomiting.  Reported that she had  been sick times one month with lower abdominal discomfort and nausea.  Upon  presentation to ER, the nausea and abdominal pain had increased.  There had  been no signs of GI bleeding.  Three weeks prior to admission, the patient  had some kind of surgical procedure in the right lower quadrant.  Exact  procedure unknown.  It is thought to have been either a hernia repair or  excision of a lipoma.  The acute abdominal study with chest was done on  September 08, 2002 showed heart that was normal in size.  Lungs clear.  In the  abdomen, gas in both small and large bowels.  No evidence of free gas  intraperitoneally.  No air fluid levels were seen.  This may have been  representative of an early ileus.  With IV fluids and bowel rest, her  symptoms improved significantly over the course of hospitalization.   LABORATORY DATA:  Catheterized urinalysis done on September 08, 2002 showed no  growth.  Blood cultures that were done on September 08, 2002 showed no growth.  Her potassium on admission was 226.  Potassium was added to her IV fluids.  Now her labs are as follows:  White blood cell count of 4.8, red blood  cell  count 3.75, hemoglobin 11.3, hematocrit 32.7, MCV 87, MCHC 34.5, RDW 14.2,  platelet count 79.  Sodium 140, potassium 3.6, chloride 112, CO2 23, glucose  104, BUN 8, creatinine 1.0, calcium 8.8.   DISCHARGE MEDICATIONS:  1. Risperdal 0.5 mg p.o. q.d.  2. Prinivil 20 mg p.o. b.i.d.  (this has been increased from once a day).  3. Inderal 40 mg p.o. q.d.  4. Norvasc 5 mg p.o. q.d.  5. Klonopin 0.5 mg p.o. q.d.  6. Protonix 40 mg p.o. b.i.d.  7. Carafate 1 gm p.o. q.i.d.  8. Ambien 10 mg p.o. q.h.s.  9. Nitrostat 0.4 mg sublingual times three doses every five minutes.  10.      MiraLax 17 gm p.o. q.d. p.r.n. constipation.  11.  She also has Tylenol 650 mg p.o. q.4h. p.r.n. pain.   ACTIVITY:  There are no restrictions.  She can be up ad lib.   WOUND CARE:  No applicable.   DIET:  Regular diet.   FOLLOW UP:  Her Prinivil was recently increased as of this morning to b.i.d.  Her blood pressures need to be monitored daily and followed by her primary  care physician.  She needs to be seen in one week by her primary care  physician as a follow up visit post hospitalization.   CONDITION ON DISCHARGE:  Stable.     Aurelio Brash, N.P.             Wilson Singer, M.D.    CKV/MEDQ  D:  09/12/2002  T:  09/12/2002  Job:  [id number]

## 2010-12-20 ENCOUNTER — Emergency Department (HOSPITAL_COMMUNITY)
Admission: EM | Admit: 2010-12-20 | Discharge: 2010-12-20 | Disposition: A | Discharge status: Home / Self Care | Payer: Medicare Other | Attending: Emergency Medicine | Admitting: Emergency Medicine

## 2010-12-20 ENCOUNTER — Emergency Department (HOSPITAL_COMMUNITY): Payer: Medicare Other

## 2010-12-20 DIAGNOSIS — R42 Dizziness and giddiness: Secondary | ICD-10-CM | POA: Insufficient documentation

## 2010-12-20 DIAGNOSIS — F068 Other specified mental disorders due to known physiological condition: Secondary | ICD-10-CM | POA: Insufficient documentation

## 2010-12-20 DIAGNOSIS — I509 Heart failure, unspecified: Secondary | ICD-10-CM | POA: Insufficient documentation

## 2010-12-20 DIAGNOSIS — M1990 Unspecified osteoarthritis, unspecified site: Secondary | ICD-10-CM | POA: Insufficient documentation

## 2010-12-20 DIAGNOSIS — I4891 Unspecified atrial fibrillation: Secondary | ICD-10-CM | POA: Insufficient documentation

## 2010-12-20 DIAGNOSIS — I428 Other cardiomyopathies: Secondary | ICD-10-CM | POA: Insufficient documentation

## 2010-12-20 DIAGNOSIS — I2510 Atherosclerotic heart disease of native coronary artery without angina pectoris: Secondary | ICD-10-CM | POA: Insufficient documentation

## 2010-12-20 DIAGNOSIS — R51 Headache: Secondary | ICD-10-CM | POA: Insufficient documentation

## 2010-12-20 DIAGNOSIS — I10 Essential (primary) hypertension: Secondary | ICD-10-CM | POA: Insufficient documentation

## 2010-12-20 LAB — BASIC METABOLIC PANEL
CO2: 27 mEq/L (ref 19–32)
Calcium: 10.2 mg/dL (ref 8.4–10.5)
Glucose, Bld: 90 mg/dL (ref 70–99)
Potassium: 3.8 mEq/L (ref 3.5–5.1)
Sodium: 139 mEq/L (ref 135–145)

## 2010-12-20 LAB — GLUCOSE, CAPILLARY: Glucose-Capillary: 97 mg/dL (ref 70–99)

## 2010-12-20 LAB — SEDIMENTATION RATE: Sed Rate: 3 mm/hr (ref 0–22)

## 2011-02-02 ENCOUNTER — Other Ambulatory Visit: Payer: Self-pay | Admitting: Internal Medicine

## 2011-02-02 DIAGNOSIS — M79604 Pain in right leg: Secondary | ICD-10-CM

## 2011-02-08 ENCOUNTER — Other Ambulatory Visit: Payer: Medicare Other

## 2011-03-17 LAB — I-STAT 8, (EC8 V) (CONVERTED LAB)
Acid-Base Excess: 1
Chloride: 107
HCT: 42
Operator id: 284251
Potassium: 3.5
TCO2: 26
pCO2, Ven: 37.6 — ABNORMAL LOW
pH, Ven: 7.426 — ABNORMAL HIGH

## 2011-03-17 LAB — POCT I-STAT CREATININE: Operator id: 284251

## 2011-03-17 LAB — CBC
MCHC: 33.2
Platelets: 239
RDW: 13.8

## 2011-03-17 LAB — DIFFERENTIAL
Basophils Absolute: 0
Basophils Relative: 1
Lymphocytes Relative: 31
Neutro Abs: 3.2

## 2011-03-30 LAB — URINALYSIS, ROUTINE W REFLEX MICROSCOPIC
Bilirubin Urine: NEGATIVE
Hgb urine dipstick: NEGATIVE
Ketones, ur: NEGATIVE
Nitrite: NEGATIVE
Protein, ur: NEGATIVE
Urobilinogen, UA: 0.2

## 2011-03-30 LAB — BASIC METABOLIC PANEL
BUN: 17
CO2: 24
GFR calc non Af Amer: 27 — ABNORMAL LOW
Glucose, Bld: 120 — ABNORMAL HIGH
Potassium: 3.8
Sodium: 139

## 2011-03-30 LAB — CBC
HCT: 37
Hemoglobin: 12.1
MCHC: 32.8
MCV: 91.6
Platelets: 221
RDW: 14.2

## 2011-04-15 LAB — CBC
HCT: 32.8 — ABNORMAL LOW
HCT: 34 — ABNORMAL LOW
Hemoglobin: 10.8 — ABNORMAL LOW
Hemoglobin: 11.1 — ABNORMAL LOW
MCHC: 33
MCV: 87.9
MCV: 88.2
RBC: 3.73 — ABNORMAL LOW
RBC: 3.86 — ABNORMAL LOW
WBC: 4.5
WBC: 4.7

## 2011-04-15 LAB — D-DIMER, QUANTITATIVE: D-Dimer, Quant: 0.27

## 2011-04-15 LAB — I-STAT 8, (EC8 V) (CONVERTED LAB)
BUN: 23
Bicarbonate: 25 — ABNORMAL HIGH
Glucose, Bld: 86
Hemoglobin: 11.9 — ABNORMAL LOW
Sodium: 139
pH, Ven: 7.336 — ABNORMAL HIGH

## 2011-04-15 LAB — DIFFERENTIAL
Eosinophils Absolute: 0.1
Lymphs Abs: 1.4
Monocytes Absolute: 0.5
Monocytes Relative: 12 — ABNORMAL HIGH
Neutrophils Relative %: 54

## 2011-04-15 LAB — BASIC METABOLIC PANEL
BUN: 22
Chloride: 110
GFR calc Af Amer: 39 — ABNORMAL LOW
GFR calc non Af Amer: 33 — ABNORMAL LOW
Potassium: 3.7
Sodium: 139

## 2011-04-15 LAB — URINALYSIS, ROUTINE W REFLEX MICROSCOPIC
Bilirubin Urine: NEGATIVE
Ketones, ur: NEGATIVE
Nitrite: NEGATIVE
Urobilinogen, UA: 0.2

## 2011-04-15 LAB — LIPID PANEL
Cholesterol: 126
LDL Cholesterol: 64
Total CHOL/HDL Ratio: 3.2
Triglycerides: 110
VLDL: 22

## 2011-04-15 LAB — POCT CARDIAC MARKERS
CKMB, poc: 4.1
Myoglobin, poc: 261
Myoglobin, poc: 271
Operator id: 151321
Operator id: 151321

## 2011-04-15 LAB — CK TOTAL AND CKMB (NOT AT ARMC): Total CK: 400 — ABNORMAL HIGH

## 2011-04-15 LAB — POCT I-STAT CREATININE: Creatinine, Ser: 1.7 — ABNORMAL HIGH

## 2011-04-15 LAB — CARDIAC PANEL(CRET KIN+CKTOT+MB+TROPI)
CK, MB: 6 — ABNORMAL HIGH
Relative Index: 1.6
Troponin I: 0.01

## 2011-04-15 LAB — TROPONIN I: Troponin I: 0.03

## 2011-04-15 LAB — B-NATRIURETIC PEPTIDE (CONVERTED LAB): Pro B Natriuretic peptide (BNP): 54

## 2011-08-30 ENCOUNTER — Emergency Department (HOSPITAL_COMMUNITY)
Admission: EM | Admit: 2011-08-30 | Discharge: 2011-08-30 | Disposition: A | Discharge status: Home / Self Care | Payer: Medicare Other | Attending: Emergency Medicine | Admitting: Emergency Medicine

## 2011-08-30 ENCOUNTER — Encounter (HOSPITAL_COMMUNITY): Payer: Self-pay | Admitting: Emergency Medicine

## 2011-08-30 ENCOUNTER — Emergency Department (HOSPITAL_COMMUNITY): Payer: Medicare Other

## 2011-08-30 DIAGNOSIS — R109 Unspecified abdominal pain: Secondary | ICD-10-CM | POA: Insufficient documentation

## 2011-08-30 DIAGNOSIS — E785 Hyperlipidemia, unspecified: Secondary | ICD-10-CM | POA: Insufficient documentation

## 2011-08-30 DIAGNOSIS — I10 Essential (primary) hypertension: Secondary | ICD-10-CM | POA: Insufficient documentation

## 2011-08-30 DIAGNOSIS — R0602 Shortness of breath: Secondary | ICD-10-CM | POA: Insufficient documentation

## 2011-08-30 DIAGNOSIS — Z79899 Other long term (current) drug therapy: Secondary | ICD-10-CM | POA: Insufficient documentation

## 2011-08-30 DIAGNOSIS — F0390 Unspecified dementia without behavioral disturbance: Secondary | ICD-10-CM | POA: Insufficient documentation

## 2011-08-30 DIAGNOSIS — K219 Gastro-esophageal reflux disease without esophagitis: Secondary | ICD-10-CM | POA: Insufficient documentation

## 2011-08-30 DIAGNOSIS — I498 Other specified cardiac arrhythmias: Secondary | ICD-10-CM | POA: Insufficient documentation

## 2011-08-30 DIAGNOSIS — I2510 Atherosclerotic heart disease of native coronary artery without angina pectoris: Secondary | ICD-10-CM | POA: Insufficient documentation

## 2011-08-30 DIAGNOSIS — R079 Chest pain, unspecified: Secondary | ICD-10-CM | POA: Insufficient documentation

## 2011-08-30 HISTORY — DX: Anxiety disorder, unspecified: F41.9

## 2011-08-30 HISTORY — DX: Unspecified atrial fibrillation: I48.91

## 2011-08-30 HISTORY — DX: Heart failure, unspecified: I50.9

## 2011-08-30 HISTORY — DX: Unspecified dementia, unspecified severity, without behavioral disturbance, psychotic disturbance, mood disturbance, and anxiety: F03.90

## 2011-08-30 HISTORY — DX: Essential (primary) hypertension: I10

## 2011-08-30 HISTORY — DX: Gastro-esophageal reflux disease without esophagitis: K21.9

## 2011-08-30 HISTORY — DX: Atherosclerotic heart disease of native coronary artery without angina pectoris: I25.10

## 2011-08-30 HISTORY — DX: Hyperlipidemia, unspecified: E78.5

## 2011-08-30 LAB — DIFFERENTIAL
Eosinophils Relative: 5 % (ref 0–5)
Lymphocytes Relative: 35 % (ref 12–46)
Lymphs Abs: 1.8 10*3/uL (ref 0.7–4.0)
Monocytes Absolute: 0.6 10*3/uL (ref 0.1–1.0)
Monocytes Relative: 12 % (ref 3–12)

## 2011-08-30 LAB — CBC
HCT: 35.2 % — ABNORMAL LOW (ref 36.0–46.0)
Hemoglobin: 11.7 g/dL — ABNORMAL LOW (ref 12.0–15.0)
MCH: 28.7 pg (ref 26.0–34.0)
MCV: 86.3 fL (ref 78.0–100.0)
RBC: 4.08 MIL/uL (ref 3.87–5.11)
WBC: 5.3 10*3/uL (ref 4.0–10.5)

## 2011-08-30 LAB — BASIC METABOLIC PANEL
BUN: 29 mg/dL — ABNORMAL HIGH (ref 6–23)
CO2: 26 mEq/L (ref 19–32)
Calcium: 10.1 mg/dL (ref 8.4–10.5)
Creatinine, Ser: 1.46 mg/dL — ABNORMAL HIGH (ref 0.50–1.10)
Glucose, Bld: 121 mg/dL — ABNORMAL HIGH (ref 70–99)
Sodium: 136 mEq/L (ref 135–145)

## 2011-08-30 LAB — CARDIAC PANEL(CRET KIN+CKTOT+MB+TROPI): CK, MB: 4.6 ng/mL — ABNORMAL HIGH (ref 0.3–4.0)

## 2011-08-30 LAB — PROTIME-INR: Prothrombin Time: 12.9 seconds (ref 11.6–15.2)

## 2011-08-30 NOTE — ED Provider Notes (Signed)
History     CSN: [id number]  Arrival date & time 08/30/11  1346   First MD Initiated Contact with Patient 08/30/11 1505      Chief Complaint  Patient presents with  . Chest Pain    intermittant for 1 week.    (Consider location/radiation/quality/duration/timing/severity/associated sxs/prior treatment) Patient is a 78 y.o. female presenting with chest pain. The history is provided by the patient.  Chest Pain The chest pain began more than 2 weeks ago. Chest pain occurs intermittently. The pain is associated with eating. The pain is currently at 0/10. The quality of the pain is described as burning. The pain radiates to the epigastrium. Chest pain is worsened by eating. Primary symptoms include abdominal pain and nausea. Pertinent negatives for primary symptoms include no fever, no shortness of breath, no cough, no wheezing, no palpitations, no vomiting and no dizziness.  Pertinent negatives for associated symptoms include no numbness and no weakness. She tried proton pump inhibitors for the symptoms.   Pt has had worsening episodic burning pain from epigastrium radiating to chest and back of throat. States she gets acidic taste in back of throat. Pain is worse after she eats. Currently without pain. Baseline SOB and cough without acute exacerbation. No new lower ext swelling or pain.   Past Medical History  Diagnosis Date  . Atrial fibrillation   . Coronary artery disease   . Dementia   . Hypertension   . Hyperlipidemia   . Anxiety   . GERD (gastroesophageal reflux disease)   . CHF (congestive heart failure)     No past surgical history on file.  No family history on file.  History  Substance Use Topics  . Smoking status: Not on file  . Smokeless tobacco: Not on file  . Alcohol Use:     OB History    Grav Para Term Preterm Abortions TAB SAB Ect Mult Living                  Review of Systems  Constitutional: Negative for fever.  HENT: Positive for sore throat.     Respiratory: Negative for cough, shortness of breath and wheezing.   Cardiovascular: Positive for chest pain. Negative for palpitations and leg swelling.  Gastrointestinal: Positive for nausea and abdominal pain. Negative for vomiting.  Musculoskeletal: Negative for back pain and joint swelling.  Skin: Negative for color change, pallor and rash.  Neurological: Negative for dizziness, weakness and numbness.    Allergies  Review of patient's allergies indicates no known allergies.  Home Medications   Current Outpatient Rx  Name Route Sig Dispense Refill  . ACETAMINOPHEN 325 MG PO TABS Oral Take 650 mg by mouth 3 (three) times daily.    Marland Kitchen AMLODIPINE BESYLATE 10 MG PO TABS Oral Take 10 mg by mouth daily.    Marland Kitchen CALCIUM CARBONATE-VITAMIN D 500-200 MG-UNIT PO TABS Oral Take 1 tablet by mouth daily.    Marland Kitchen CETIRIZINE HCL 10 MG PO TABS Oral Take 10 mg by mouth daily.    Marland Kitchen CITALOPRAM HYDROBROMIDE 20 MG PO TABS Oral Take 20 mg by mouth daily.    Marland Kitchen CLONAZEPAM 1 MG PO TABS Oral Take 0.5 mg by mouth as needed. For anxiety    . CLONIDINE HCL 0.2 MG PO TABS Oral Take 0.2 mg by mouth at bedtime.    Marland Kitchen DEXTROMETHORPHAN-GUAIFENESIN 10-100 MG/5ML PO SYRP Oral Take 10 mLs by mouth every 4 (four) hours. While awake for cough    . DICLOFENAC SODIUM  1 % TD GEL Topical Apply 1 application topically every 6 (six) hours as needed. Apply 4 grams. May keep in room.    Marland Kitchen DILTIAZEM HCL 30 MG PO TABS Oral Take 30 mg by mouth 2 (two) times daily.    Marland Kitchen EZETIMIBE 10 MG PO TABS Oral Take 10 mg by mouth at bedtime.    Marland Kitchen FLUTICASONE PROPIONATE 50 MCG/ACT NA SUSP Nasal Place 2 sprays into the nose daily.    Marland Kitchen HYDROCODONE-ACETAMINOPHEN 5-325 MG PO TABS Oral Take 1 tablet by mouth 2 (two) times daily as needed. For pain    . HYDROCORTISONE ACETATE 25 MG RE SUPP Rectal Place 25 mg rectally 2 (two) times daily.    . ISOSORBIDE DINITRATE 30 MG PO TABS Oral Take 30 mg by mouth daily.    Marland Kitchen KETOROLAC TROMETHAMINE 0.4 % OP SOLN Right  Eye Place 1 drop into the right eye 2 (two) times daily.    Marland Kitchen LOSARTAN POTASSIUM 50 MG PO TABS Oral Take 50 mg by mouth daily.    Marland Kitchen METOCLOPRAMIDE HCL 5 MG PO TABS Oral Take 5 mg by mouth 4 (four) times daily. Take before meals and at bedtime    . METOPROLOL SUCCINATE ER 50 MG PO TB24 Oral Take 75 mg by mouth daily.    Marland Kitchen NITROGLYCERIN 0.4 MG/SPRAY TL SOLN Sublingual Place 2 sprays under the tongue every 5 (five) minutes as needed. Use 2 sprays under tongue as needed for chest pain. Repeat 2 sprays if pain persists after 15 minutes if pain still persists    . OLOPATADINE HCL 0.2 % OP SOLN Both Eyes Place 1 drop into both eyes daily.    . OXYBUTYNIN CHLORIDE ER 5 MG PO TB24 Oral Take 5 mg by mouth daily.    Marland Kitchen PANTOPRAZOLE SODIUM 40 MG PO TBEC Oral Take 40 mg by mouth daily. Do not crush    . POLYETHYLENE GLYCOL 3350 PO PACK Oral Take 17 g by mouth as directed. Mix 1 capful in 8oz of juice/water and drink 3 times each week    . POTASSIUM CHLORIDE ER 10 MEQ PO TBCR Oral Take 10 mEq by mouth 2 (two) times daily. Do not crush    . ROSUVASTATIN CALCIUM 5 MG PO TABS Oral Take 5 mg by mouth daily.    Marland Kitchen SITAGLIPTIN PHOSPHATE 50 MG PO TABS Oral Take 50 mg by mouth daily.    Marland Kitchen SPIRONOLACTONE 25 MG PO TABS Oral Take 25 mg by mouth daily.    . SUCRALFATE 1 G PO TABS Oral Take 1 g by mouth 4 (four) times daily.    Marland Kitchen VITAMIN D (ERGOCALCIFEROL) 50000 UNITS PO CAPS Oral Take 50,000 Units by mouth every 7 (seven) days. On Saturdays      BP 159/52  Pulse 56  Temp(Src) 98.1 F (36.7 C) (Oral)  SpO2 94%  Physical Exam  Nursing note and vitals reviewed. Constitutional: She is oriented to person, place, and time. She appears well-developed and well-nourished.  HENT:  Head: Normocephalic and atraumatic.  Mouth/Throat: Oropharynx is clear and moist.  Eyes: EOM are normal. Pupils are equal, round, and reactive to light.  Neck: Normal range of motion. Neck supple.  Cardiovascular: Normal rate and regular rhythm.    Pulmonary/Chest: Effort normal and breath sounds normal. She has no wheezes. She has no rales. She exhibits no tenderness.  Abdominal: Soft. Bowel sounds are normal. There is no tenderness. There is no rebound and no guarding.  Musculoskeletal: Normal range of motion.  She exhibits no edema and no tenderness.       No calf tenderness  Neurological: She is alert and oriented to person, place, and time.  Skin: Skin is warm and dry. No rash noted. No erythema.  Psychiatric: She has a normal mood and affect. Her behavior is normal.    ED Course  Procedures (including critical care time)  Labs Reviewed  CBC - Abnormal; Notable for the following:    Hemoglobin 11.7 (*)    HCT 35.2 (*)    All other components within normal limits  BASIC METABOLIC PANEL - Abnormal; Notable for the following:    Glucose, Bld 121 (*)    BUN 29 (*)    Creatinine, Ser 1.46 (*)    GFR calc non Af Amer 32 (*)    GFR calc Af Amer 37 (*)    All other components within normal limits  CARDIAC PANEL(CRET KIN+CKTOT+MB+TROPI) - Abnormal; Notable for the following:    Total CK 276 (*)    CK, MB 4.6 (*)    All other components within normal limits  DIFFERENTIAL  PROTIME-INR  APTT  POCT I-STAT TROPONIN I  LAB REPORT - SCANNED   No results found.   1. Gastroesophageal reflux disease      Date: 08/30/2011  Rate: 57  Rhythm: Sinus Bradycardia  QRS Axis: normal  Intervals: normal  ST/T Wave abnormalities: normal  Conduction Disutrbances:none  Narrative Interpretation:   Old EKG Reviewed: unchanged    MDM          Loren Racer, MD 09/01/11 1558

## 2011-08-30 NOTE — ED Notes (Signed)
Per EMS pt has had chest pain x 1 week that is generalized and at time pain located in throat area. Pt has history of GERDs. BP-140/102, HR-56.

## 2011-11-18 ENCOUNTER — Encounter: Payer: Self-pay | Admitting: Internal Medicine

## 2011-12-19 ENCOUNTER — Encounter (HOSPITAL_COMMUNITY): Payer: Self-pay | Admitting: Emergency Medicine

## 2011-12-19 ENCOUNTER — Observation Stay (HOSPITAL_COMMUNITY): Payer: Medicare Other

## 2011-12-19 ENCOUNTER — Emergency Department (HOSPITAL_COMMUNITY): Payer: Medicare Other

## 2011-12-19 ENCOUNTER — Inpatient Hospital Stay (HOSPITAL_COMMUNITY)
Admission: EM | Admit: 2011-12-19 | Discharge: 2011-12-22 | DRG: 287 | Disposition: A | Discharge status: Nursing Home (Includes ICF) | Payer: Medicare Other | Attending: Cardiovascular Disease | Admitting: Cardiovascular Disease

## 2011-12-19 DIAGNOSIS — M1990 Unspecified osteoarthritis, unspecified site: Secondary | ICD-10-CM | POA: Diagnosis present

## 2011-12-19 DIAGNOSIS — R0789 Other chest pain: Principal | ICD-10-CM | POA: Diagnosis present

## 2011-12-19 DIAGNOSIS — R079 Chest pain, unspecified: Secondary | ICD-10-CM

## 2011-12-19 DIAGNOSIS — I498 Other specified cardiac arrhythmias: Secondary | ICD-10-CM | POA: Diagnosis not present

## 2011-12-19 DIAGNOSIS — E785 Hyperlipidemia, unspecified: Secondary | ICD-10-CM | POA: Diagnosis present

## 2011-12-19 DIAGNOSIS — I4891 Unspecified atrial fibrillation: Secondary | ICD-10-CM | POA: Diagnosis present

## 2011-12-19 DIAGNOSIS — G4733 Obstructive sleep apnea (adult) (pediatric): Secondary | ICD-10-CM | POA: Diagnosis present

## 2011-12-19 DIAGNOSIS — K5909 Other constipation: Secondary | ICD-10-CM | POA: Diagnosis present

## 2011-12-19 DIAGNOSIS — I509 Heart failure, unspecified: Secondary | ICD-10-CM | POA: Diagnosis present

## 2011-12-19 DIAGNOSIS — E119 Type 2 diabetes mellitus without complications: Secondary | ICD-10-CM | POA: Diagnosis present

## 2011-12-19 DIAGNOSIS — F411 Generalized anxiety disorder: Secondary | ICD-10-CM | POA: Diagnosis present

## 2011-12-19 DIAGNOSIS — Z9989 Dependence on other enabling machines and devices: Secondary | ICD-10-CM | POA: Diagnosis present

## 2011-12-19 DIAGNOSIS — I2510 Atherosclerotic heart disease of native coronary artery without angina pectoris: Secondary | ICD-10-CM | POA: Diagnosis present

## 2011-12-19 DIAGNOSIS — I2589 Other forms of chronic ischemic heart disease: Secondary | ICD-10-CM | POA: Diagnosis present

## 2011-12-19 DIAGNOSIS — E782 Mixed hyperlipidemia: Secondary | ICD-10-CM

## 2011-12-19 DIAGNOSIS — E1165 Type 2 diabetes mellitus with hyperglycemia: Secondary | ICD-10-CM

## 2011-12-19 DIAGNOSIS — I10 Essential (primary) hypertension: Secondary | ICD-10-CM | POA: Diagnosis present

## 2011-12-19 DIAGNOSIS — F0390 Unspecified dementia without behavioral disturbance: Secondary | ICD-10-CM | POA: Diagnosis present

## 2011-12-19 DIAGNOSIS — Z9861 Coronary angioplasty status: Secondary | ICD-10-CM

## 2011-12-19 DIAGNOSIS — Z79899 Other long term (current) drug therapy: Secondary | ICD-10-CM

## 2011-12-19 DIAGNOSIS — K219 Gastro-esophageal reflux disease without esophagitis: Secondary | ICD-10-CM | POA: Diagnosis present

## 2011-12-19 DIAGNOSIS — I252 Old myocardial infarction: Secondary | ICD-10-CM

## 2011-12-19 DIAGNOSIS — R001 Bradycardia, unspecified: Secondary | ICD-10-CM | POA: Diagnosis present

## 2011-12-19 DIAGNOSIS — E669 Obesity, unspecified: Secondary | ICD-10-CM | POA: Diagnosis present

## 2011-12-19 HISTORY — DX: Atherosclerotic heart disease of native coronary artery without angina pectoris: I25.10

## 2011-12-19 HISTORY — DX: Bradycardia, unspecified: R00.1

## 2011-12-19 HISTORY — DX: Dependence on other enabling machines and devices: Z99.89

## 2011-12-19 HISTORY — DX: Essential (primary) hypertension: I10

## 2011-12-19 HISTORY — DX: Obstructive sleep apnea (adult) (pediatric): G47.33

## 2011-12-19 HISTORY — DX: Hyperlipidemia, unspecified: E78.5

## 2011-12-19 HISTORY — DX: Chest pain, unspecified: R07.9

## 2011-12-19 LAB — DIFFERENTIAL
Basophils Relative: 1 % (ref 0–1)
Eosinophils Absolute: 0.2 10*3/uL (ref 0.0–0.7)
Monocytes Absolute: 0.5 10*3/uL (ref 0.1–1.0)
Monocytes Relative: 11 % (ref 3–12)

## 2011-12-19 LAB — URINALYSIS, ROUTINE W REFLEX MICROSCOPIC
Bilirubin Urine: NEGATIVE
Hgb urine dipstick: NEGATIVE
Ketones, ur: NEGATIVE mg/dL
Nitrite: NEGATIVE
Urobilinogen, UA: 0.2 mg/dL (ref 0.0–1.0)
pH: 7 (ref 5.0–8.0)

## 2011-12-19 LAB — CBC
HCT: 36.3 % (ref 36.0–46.0)
Hemoglobin: 12 g/dL (ref 12.0–15.0)
Hemoglobin: 12.9 g/dL (ref 12.0–15.0)
MCH: 28.5 pg (ref 26.0–34.0)
MCH: 28.7 pg (ref 26.0–34.0)
MCHC: 33.1 g/dL (ref 30.0–36.0)
MCHC: 33.4 g/dL (ref 30.0–36.0)
MCV: 86.2 fL (ref 78.0–100.0)
RDW: 13.7 % (ref 11.5–15.5)

## 2011-12-19 LAB — BASIC METABOLIC PANEL
BUN: 27 mg/dL — ABNORMAL HIGH (ref 6–23)
Creatinine, Ser: 1.16 mg/dL — ABNORMAL HIGH (ref 0.50–1.10)
GFR calc Af Amer: 49 mL/min — ABNORMAL LOW (ref >90)
GFR calc non Af Amer: 42 mL/min — ABNORMAL LOW (ref >90)

## 2011-12-19 LAB — CARDIAC PANEL(CRET KIN+CKTOT+MB+TROPI): Troponin I: <0.3 ng/mL (ref <0.30)

## 2011-12-19 LAB — TROPONIN I: Troponin I: <0.3 ng/mL (ref <0.30)

## 2011-12-19 LAB — MAGNESIUM: Magnesium: 1.8 mg/dL (ref 1.5–2.5)

## 2011-12-19 MED ORDER — FENOFIBRATE 54 MG PO TABS
54.0000 mg | ORAL_TABLET | Freq: Every day | ORAL | Status: DC
  Administered 2011-12-19 – 2011-12-22 (×4): 54 mg via ORAL
  Filled 2011-12-19 (×4): qty 1

## 2011-12-19 MED ORDER — HYDROCODONE-ACETAMINOPHEN 5-325 MG PO TABS
1.0000 | ORAL_TABLET | ORAL | Status: DC | PRN
  Administered 2011-12-19 – 2011-12-22 (×3): 1 via ORAL
  Filled 2011-12-19: qty 1

## 2011-12-19 MED ORDER — OLOPATADINE HCL 0.2 % OP SOLN: 1.0000 [drp] | Freq: Every day | OPHTHALMIC | Status: DC

## 2011-12-19 MED ORDER — ENOXAPARIN SODIUM 40 MG/0.4ML ~~LOC~~ SOLN
40.0000 mg | SUBCUTANEOUS | Status: DC
  Administered 2011-12-19: 40 mg via SUBCUTANEOUS
  Filled 2011-12-19 (×2): qty 0.4

## 2011-12-19 MED ORDER — LOSARTAN POTASSIUM 50 MG PO TABS
50.0000 mg | ORAL_TABLET | Freq: Every day | ORAL | Status: DC
  Administered 2011-12-19 – 2011-12-22 (×4): 50 mg via ORAL
  Filled 2011-12-19 (×4): qty 1

## 2011-12-19 MED ORDER — METOPROLOL SUCCINATE ER 50 MG PO TB24
75.0000 mg | ORAL_TABLET | Freq: Every day | ORAL | Status: DC
  Administered 2011-12-19: 75 mg via ORAL
  Filled 2011-12-19 (×2): qty 1

## 2011-12-19 MED ORDER — CLONIDINE HCL 0.2 MG PO TABS
0.2000 mg | ORAL_TABLET | Freq: Every day | ORAL | Status: DC
  Administered 2011-12-19 – 2011-12-21 (×3): 0.2 mg via ORAL
  Filled 2011-12-19 (×4): qty 1

## 2011-12-19 MED ORDER — CLONIDINE HCL 0.2 MG PO TABS
0.2000 mg | ORAL_TABLET | Freq: Four times a day (QID) | ORAL | Status: DC | PRN
  Filled 2011-12-19: qty 1

## 2011-12-19 MED ORDER — CLONAZEPAM 0.5 MG PO TABS
0.5000 mg | ORAL_TABLET | Freq: Two times a day (BID) | ORAL | Status: DC
  Administered 2011-12-19 – 2011-12-22 (×6): 0.5 mg via ORAL
  Filled 2011-12-19 (×6): qty 1

## 2011-12-19 MED ORDER — DILTIAZEM HCL 60 MG PO TABS
120.0000 mg | ORAL_TABLET | Freq: Two times a day (BID) | ORAL | Status: DC
Start: 2011-12-19 — End: 2011-12-19
  Filled 2011-12-19: qty 2

## 2011-12-19 MED ORDER — CITALOPRAM HYDROBROMIDE 20 MG PO TABS
20.0000 mg | ORAL_TABLET | Freq: Every day | ORAL | Status: DC
  Administered 2011-12-19 – 2011-12-22 (×4): 20 mg via ORAL
  Filled 2011-12-19 (×4): qty 1

## 2011-12-19 MED ORDER — PANTOPRAZOLE SODIUM 40 MG PO TBEC
40.0000 mg | DELAYED_RELEASE_TABLET | Freq: Every day | ORAL | Status: DC
  Administered 2011-12-19 – 2011-12-20 (×2): 40 mg via ORAL
  Filled 2011-12-19 (×3): qty 1

## 2011-12-19 MED ORDER — EZETIMIBE 10 MG PO TABS
10.0000 mg | ORAL_TABLET | Freq: Every day | ORAL | Status: DC
  Administered 2011-12-19 – 2011-12-21 (×3): 10 mg via ORAL
  Filled 2011-12-19 (×4): qty 1

## 2011-12-19 MED ORDER — SPIRONOLACTONE 25 MG PO TABS
25.0000 mg | ORAL_TABLET | Freq: Every day | ORAL | Status: DC
  Administered 2011-12-20 – 2011-12-22 (×3): 25 mg via ORAL
  Filled 2011-12-19 (×3): qty 1

## 2011-12-19 MED ORDER — OXYBUTYNIN CHLORIDE 5 MG PO TABS
5.0000 mg | ORAL_TABLET | Freq: Every day | ORAL | Status: DC
  Administered 2011-12-19 – 2011-12-22 (×4): 5 mg via ORAL
  Filled 2011-12-19 (×4): qty 1

## 2011-12-19 MED ORDER — DOCUSATE SODIUM 100 MG PO CAPS
100.0000 mg | ORAL_CAPSULE | Freq: Two times a day (BID) | ORAL | Status: DC
  Administered 2011-12-19 – 2011-12-22 (×6): 100 mg via ORAL
  Filled 2011-12-19 (×7): qty 1

## 2011-12-19 MED ORDER — ACETAMINOPHEN 325 MG PO TABS: 650.0000 mg | ORAL_TABLET | Freq: Four times a day (QID) | ORAL | Status: DC | PRN

## 2011-12-19 MED ORDER — SODIUM CHLORIDE 0.9 % IJ SOLN
3.0000 mL | Freq: Two times a day (BID) | INTRAMUSCULAR | Status: DC
  Administered 2011-12-19 – 2011-12-20 (×3): 3 mL via INTRAVENOUS

## 2011-12-19 MED ORDER — ASPIRIN EC 81 MG PO TBEC
81.0000 mg | DELAYED_RELEASE_TABLET | Freq: Every day | ORAL | Status: DC
  Administered 2011-12-19 – 2011-12-22 (×3): 81 mg via ORAL
  Filled 2011-12-19 (×4): qty 1

## 2011-12-19 MED ORDER — HYDROCORTISONE ACETATE 25 MG RE SUPP
25.0000 mg | Freq: Two times a day (BID) | RECTAL | Status: DC
  Administered 2011-12-19 – 2011-12-22 (×5): 25 mg via RECTAL
  Filled 2011-12-19 (×7): qty 1

## 2011-12-19 MED ORDER — HYDROCODONE-ACETAMINOPHEN 5-325 MG PO TABS
1.0000 | ORAL_TABLET | Freq: Two times a day (BID) | ORAL | Status: DC
  Administered 2011-12-19 – 2011-12-21 (×4): 1 via ORAL
  Filled 2011-12-19 (×5): qty 1

## 2011-12-19 MED ORDER — SUCRALFATE 1 G PO TABS
1.0000 g | ORAL_TABLET | Freq: Four times a day (QID) | ORAL | Status: DC
Start: 2011-12-19 — End: 2011-12-22
  Administered 2011-12-19 – 2011-12-22 (×10): 1 g via ORAL
  Filled 2011-12-19 (×14): qty 1

## 2011-12-19 MED ORDER — DILTIAZEM HCL ER 60 MG PO CP12
120.0000 mg | ORAL_CAPSULE | Freq: Two times a day (BID) | ORAL | Status: DC
Start: 2011-12-19 — End: 2011-12-19
  Filled 2011-12-19: qty 2

## 2011-12-19 MED ORDER — OLOPATADINE HCL 0.1 % OP SOLN
1.0000 [drp] | Freq: Two times a day (BID) | OPHTHALMIC | Status: DC
  Administered 2011-12-19 – 2011-12-22 (×6): 1 [drp] via OPHTHALMIC
  Filled 2011-12-19: qty 5

## 2011-12-19 MED ORDER — LEVALBUTEROL HCL 0.63 MG/3ML IN NEBU
0.6300 mg | INHALATION_SOLUTION | Freq: Four times a day (QID) | RESPIRATORY_TRACT | Status: DC
  Administered 2011-12-19 – 2011-12-22 (×11): 0.63 mg via RESPIRATORY_TRACT
  Filled 2011-12-19 (×15): qty 3

## 2011-12-19 MED ORDER — POTASSIUM CHLORIDE ER 10 MEQ PO TBCR
10.0000 meq | EXTENDED_RELEASE_TABLET | Freq: Two times a day (BID) | ORAL | Status: DC
  Administered 2011-12-19 – 2011-12-22 (×6): 10 meq via ORAL
  Filled 2011-12-19 (×7): qty 1

## 2011-12-19 MED ORDER — ISOSORBIDE MONONITRATE ER 30 MG PO TB24
30.0000 mg | ORAL_TABLET | Freq: Every day | ORAL | Status: DC
  Administered 2011-12-19 – 2011-12-20 (×2): 30 mg via ORAL
  Filled 2011-12-19 (×2): qty 1

## 2011-12-19 MED ORDER — DILTIAZEM HCL 60 MG PO TABS
60.0000 mg | ORAL_TABLET | Freq: Four times a day (QID) | ORAL | Status: DC
  Administered 2011-12-19 – 2011-12-20 (×2): 60 mg via ORAL
  Filled 2011-12-19 (×6): qty 1

## 2011-12-19 MED ORDER — LINAGLIPTIN 5 MG PO TABS
5.0000 mg | ORAL_TABLET | Freq: Every day | ORAL | Status: DC
  Administered 2011-12-19 – 2011-12-22 (×4): 5 mg via ORAL
  Filled 2011-12-19 (×4): qty 1

## 2011-12-19 MED ORDER — NYSTATIN 100000 UNIT/GM EX POWD
Freq: Three times a day (TID) | CUTANEOUS | Status: DC
  Administered 2011-12-19 – 2011-12-22 (×7): via TOPICAL
  Filled 2011-12-19 (×2): qty 15

## 2011-12-19 MED ORDER — ACETAMINOPHEN 650 MG RE SUPP: 650.0000 mg | Freq: Four times a day (QID) | RECTAL | Status: DC | PRN

## 2011-12-19 MED ORDER — ASPIRIN 81 MG PO CHEW
324.0000 mg | CHEWABLE_TABLET | Freq: Once | ORAL | Status: AC
  Administered 2011-12-19: 324 mg via ORAL
  Filled 2011-12-19: qty 4

## 2011-12-19 MED ORDER — AMLODIPINE BESYLATE 5 MG PO TABS
5.0000 mg | ORAL_TABLET | Freq: Every day | ORAL | Status: DC
  Administered 2011-12-19 – 2011-12-22 (×4): 5 mg via ORAL
  Filled 2011-12-19 (×4): qty 1

## 2011-12-19 MED ORDER — ONDANSETRON HCL 4 MG/2ML IJ SOLN: 4.0000 mg | Freq: Four times a day (QID) | INTRAMUSCULAR | Status: DC | PRN

## 2011-12-19 MED ORDER — ONDANSETRON HCL 4 MG PO TABS: 4.0000 mg | ORAL_TABLET | Freq: Four times a day (QID) | ORAL | Status: DC | PRN

## 2011-12-19 MED ORDER — SODIUM CHLORIDE 0.9 % IV SOLN
INTRAVENOUS | Status: DC
  Administered 2011-12-20: 19:00:00 via INTRAVENOUS

## 2011-12-19 MED ORDER — NITROGLYCERIN 0.4 MG/SPRAY TL SOLN: 2.0000 | Status: DC | PRN

## 2011-12-19 MED ORDER — ATORVASTATIN CALCIUM 10 MG PO TABS
10.0000 mg | ORAL_TABLET | Freq: Every day | ORAL | Status: DC
  Administered 2011-12-19 – 2011-12-21 (×3): 10 mg via ORAL
  Filled 2011-12-19 (×4): qty 1

## 2011-12-19 NOTE — ED Notes (Signed)
Patient ate 75% of her breakfast tray.

## 2011-12-19 NOTE — ED Notes (Signed)
Resting quietly with eyes closed.

## 2011-12-19 NOTE — Progress Notes (Signed)
Medical screening examination/treatment/procedure(s) were conducted as a shared visit with non-physician practitioner(s) and myself.  I personally evaluated the patient during the encounter Pt is an 78 year old lady who complains of left anterior chest pain.  She says it has been going on for 3 months, but is worse today.  She felt somewhat dizzy today also.  She says that she has a stent in her heart.  Review of prior discharge summaries shows she had a cardiac catheterization by Dr. Nanetta Batty in 2007 that showed nonobstructive coronary artery disease.  Exam today shows her to be an elderly woman in no distress at rest.  She localizes her pain to the left lateral chest.  There is no point tenderness.  Heart sounds normal, lungs clear, EKG benign, initial TNI negative.  Recommend three hour TNI.  If she continues to complain of chest pain, will request admission for cardiac R/O.

## 2011-12-19 NOTE — ED Provider Notes (Signed)
History     CSN: [id number]  Arrival date & time 12/19/11  1191   First MD Initiated Contact with Patient 12/19/11 0813      Chief Complaint  Patient presents with  . Dizziness    (Consider location/radiation/quality/duration/timing/severity/associated sxs/prior treatment) HPI Comments: Patient comes in today with a chief complaint of dizziness.  Patient brought in from an Assisted Living Facility by EMS. She reports that she began feeling dizzy this morning while walking.  She states that she fell.  She was ambulatory after the fall.  At this time she denies any pain in her neck.  She is complaining of anterior chest pain.  She reports that the chest pain has been present for the past 3 months, but is worse today.  Pain worse with palpation.  She denies any SOB, nausea, vomiting, or diaphoresis.  No numbness or tingling.  She does have a prior cardiac history.  She had a cardiac catheterization done in December 2007 by Andree Coss, which showed nonobstructive coronary disease with an EF of 65%.  She had PCI done in 2005.  She also has a history of HTN and hyperlipidemia.  The history is provided by the patient.    Past Medical History  Diagnosis Date  . Atrial fibrillation   . Coronary artery disease   . Dementia   . Hypertension   . Hyperlipidemia   . Anxiety   . GERD (gastroesophageal reflux disease)   . CHF (congestive heart failure)   . Polyp, stomach 11/11/2006  . Aphakia of left eye   . Obese   . MI (myocardial infarction)     Past Surgical History  Procedure Date  . Immature cataract     left eye    Family History  Problem Relation Age of Onset  . Colon cancer      History  Substance Use Topics  . Smoking status: Not on file  . Smokeless tobacco: Not on file  . Alcohol Use:     OB History    Grav Para Term Preterm Abortions TAB SAB Ect Mult Living                  Review of Systems  Constitutional: Negative for fever, chills and diaphoresis.    HENT: Negative for neck pain and neck stiffness.   Respiratory: Negative for cough, shortness of breath and wheezing.   Gastrointestinal: Negative for nausea, vomiting and abdominal pain.  Musculoskeletal: Negative for joint swelling.  Skin: Negative for color change and rash.  Neurological: Positive for dizziness and light-headedness. Negative for syncope and numbness.    Allergies  Review of patient's allergies indicates no known allergies.  Home Medications   Current Outpatient Rx  Name Route Sig Dispense Refill  . ACETAMINOPHEN 325 MG PO TABS Oral Take 650 mg by mouth 3 (three) times daily.    Marland Kitchen AMLODIPINE BESYLATE 5 MG PO TABS Oral Take 5 mg by mouth daily.    Marland Kitchen CALCIUM CARBONATE-VITAMIN D 500-200 MG-UNIT PO TABS Oral Take 1 tablet by mouth daily.    Marland Kitchen CETIRIZINE HCL 10 MG PO TABS Oral Take 10 mg by mouth daily.    Marland Kitchen VITAMIN D 1000 UNITS PO TABS Oral Take 1,000 Units by mouth daily.    Marland Kitchen CITALOPRAM HYDROBROMIDE 20 MG PO TABS Oral Take 20 mg by mouth daily.    Marland Kitchen CLONAZEPAM 1 MG PO TABS Oral Take 0.5 mg by mouth daily as needed. For anxiety    . CLONIDINE  HCL 0.2 MG PO TABS Oral Take 0.2 mg by mouth every 6 (six) hours as needed. For elevated blood pressure if SBP is >180 or DBP >100.    Marland Kitchen CLONIDINE HCL 0.2 MG PO TABS Oral Take 0.2 mg by mouth at bedtime.    Marland Kitchen DICLOFENAC SODIUM 1 % TD GEL Topical Apply 1 application topically every 6 (six) hours as needed. Apply 4 grams to affected area. May keep in room.    Marland Kitchen DILTIAZEM HCL 120 MG PO TABS Oral Take 120 mg by mouth 2 (two) times daily.    Marland Kitchen DOCUSATE SODIUM 100 MG PO CAPS Oral Take 100 mg by mouth 2 (two) times daily.    Marland Kitchen ESOMEPRAZOLE MAGNESIUM 40 MG PO CPDR Oral Take 40 mg by mouth daily before breakfast.    . EZETIMIBE 10 MG PO TABS Oral Take 10 mg by mouth at bedtime.    . FENOFIBRATE 48 MG PO TABS Oral Take 48 mg by mouth at bedtime.    Marland Kitchen FLUTICASONE PROPIONATE 50 MCG/ACT NA SUSP Nasal Place 2 sprays into the nose daily.    Marland Kitchen  HYDROCODONE-ACETAMINOPHEN 5-325 MG PO TABS Oral Take 1 tablet by mouth 2 (two) times daily. For pain    . HYDROCORTISONE ACETATE 25 MG RE SUPP Rectal Place 25 mg rectally 2 (two) times daily.    . ISOSORBIDE MONONITRATE ER 30 MG PO TB24 Oral Take 30 mg by mouth daily.    Marland Kitchen KETOROLAC TROMETHAMINE 0.4 % OP SOLN Right Eye Place 1 drop into the right eye 2 (two) times daily.    Marland Kitchen LOSARTAN POTASSIUM 50 MG PO TABS Oral Take 50 mg by mouth daily.    Marland Kitchen METOCLOPRAMIDE HCL 5 MG PO TABS Oral Take 5 mg by mouth 4 (four) times daily -  before meals and at bedtime.     Marland Kitchen METOPROLOL SUCCINATE ER 50 MG PO TB24 Oral Take 75 mg by mouth daily.    Marland Kitchen NITROGLYCERIN 0.4 MG/SPRAY TL SOLN Sublingual Place 2 sprays under the tongue every 5 (five) minutes as needed. Use 2 sprays under tongue as needed for chest pain. Repeat 2 sprays if pain persists after 15 minutes if pain still persists    . NYSTATIN 100000 UNIT/GM EX POWD Topical Apply topically 3 (three) times daily. Apply under breast bilaterally after applying Nystatin cream for yeast rash.  Once rash has resolved, continue powder twice daily.    . NYSTATIN 100000 UNIT/GM EX CREA Topical Apply 1 application topically 3 (three) times daily. Apply under both breasts.    . OLOPATADINE HCL 0.2 % OP SOLN Both Eyes Place 1 drop into both eyes daily.    . OXYBUTYNIN CHLORIDE 5 MG PO TABS Oral Take 5 mg by mouth daily.    Marland Kitchen POLYETHYLENE GLYCOL 3350 PO PACK Oral Take 17 g by mouth as directed. Mix 1 capful in 8oz of juice/water and drink 3 times each week    . POTASSIUM CHLORIDE ER 10 MEQ PO TBCR Oral Take 10 mEq by mouth 2 (two) times daily. Do not crush    . ROSUVASTATIN CALCIUM 5 MG PO TABS Oral Take 5 mg by mouth at bedtime.     Marland Kitchen SITAGLIPTIN PHOSPHATE 50 MG PO TABS Oral Take 50 mg by mouth daily.    Marland Kitchen SPIRONOLACTONE 25 MG PO TABS Oral Take 25 mg by mouth daily.    . SUCRALFATE 1 G PO TABS Oral Take 1 g by mouth 4 (four) times daily.  BP 174/57  Pulse 48  Temp 99  F (37.2 C) (Oral)  Resp 16  SpO2 98%  Physical Exam  Nursing note and vitals reviewed. Constitutional: She is oriented to person, place, and time. She appears well-developed and well-nourished. No distress.  HENT:  Head: Normocephalic and atraumatic.  Mouth/Throat: Oropharynx is clear and moist.  Neck: Normal range of motion. Neck supple.  Cardiovascular: Normal rate, regular rhythm and normal heart sounds.   Pulmonary/Chest: Effort normal and breath sounds normal. No respiratory distress. She has no wheezes. She exhibits tenderness.  Abdominal: Soft. There is no tenderness.  Musculoskeletal: Normal range of motion.  Neurological: She is alert and oriented to person, place, and time. No cranial nerve deficit or sensory deficit.  Skin: Skin is warm and dry. She is not diaphoretic. No erythema.  Psychiatric: She has a normal mood and affect.    ED Course  Procedures (including critical care time)   Labs Reviewed  CBC  DIFFERENTIAL  BASIC METABOLIC PANEL  URINALYSIS, ROUTINE W REFLEX MICROSCOPIC  TROPONIN I   Dg Chest 2 View  12/19/2011  *RADIOLOGY REPORT*  Clinical Data: Fall.  Left-sided chest pain.  Shortness of breath. Palpitations.  CHEST - 2 VIEW  Comparison: Two-view chest x-ray 08/30/2011, 10/13/2009, 09/20/2008.  Findings: Cardiac silhouette enlarged but stable.  Thoracic aorta tortuous and atherosclerotic, unchanged.  Tortuous innominate vessels account for right paratracheal opacity, unchanged.  Hilar and mediastinal contours otherwise unremarkable.  Lungs clear. Bronchovascular markings normal.  Pulmonary vascularity normal.  No pneumothorax.  No pleural effusions.  Visualized bony thorax intact.  IMPRESSION: Stable cardiomegaly.  No acute cardiopulmonary disease.  Original Report Authenticated By: Arnell Sieving, M.D.   Ct Head Wo Contrast  12/19/2011  *RADIOLOGY REPORT*  Clinical Data: Larey Seat.  Hit head.  CT HEAD WITHOUT CONTRAST  Technique:  Contiguous axial images  were obtained from the base of the skull through the vertex without contrast.  Comparison: 12/20/2010.  Findings: Stable age related cerebral atrophy, ventriculomegaly and periventricular white matter disease.  No extra-axial fluid collections are identified.  No CT findings for acute hemispheric infarction and/or intracranial hemorrhage.  The brainstem and cerebellum grossly normal and stable.  There is chronic pansinus disease.  The calvarium is intact.  The mastoid air cells and middle ear cavities are clear.  IMPRESSION:  1.  Stable age related cerebral atrophy, ventriculomegaly and periventricular white matter disease. 2.  No acute intracranial findings. 3.  Chronic pansinusitis.  Original Report Authenticated By: P. Loralie Champagne, M.D.     No diagnosis found.    MDM  Patient presenting with chest pain and dizziness that was worse today than it has been in the past.  No acute abnormalities on EKG.  Initial cardiac enzymes negative.  Patient with prior cardiac history.  Patient admitted for CP rule out.        Pascal Lux [place], PA-C 12/19/11 2113

## 2011-12-19 NOTE — ED Notes (Signed)
Patient placed on bedpan.

## 2011-12-19 NOTE — ED Notes (Signed)
Pt placed on bedpan, tolerated without difficulty.

## 2011-12-19 NOTE — ED Provider Notes (Signed)
8:39 AM  Date: 12/19/2011  Rate:50  Rhythm: normal sinus rhythm  QRS Axis: left  Intervals: PR prolonged  ST/T Wave abnormalities: normal  Conduction Disutrbances:first-degree A-V block   Narrative Interpretation: Abnormal EKG  Old EKG Reviewed: unchanged    Carleene Cooper III, MD 12/19/11 [phone]

## 2011-12-19 NOTE — ED Notes (Signed)
Patient brought in via [facility] she was up walking around with a walker and felt dizzy and fell onto another person at the nursing home. She only complains of a slight pain in the back of the head in route,is alert and oriented times person and place. She follows commands. Upon arrival she only complains of pain in the center of chest.

## 2011-12-19 NOTE — ED Notes (Signed)
Consulting provider in to see patient 

## 2011-12-19 NOTE — ED Notes (Signed)
Back from xray

## 2011-12-19 NOTE — ED Notes (Signed)
Patient transported to CT 

## 2011-12-19 NOTE — ED Notes (Signed)
Patient refused to stand due to instability for standing section of orthos.

## 2011-12-19 NOTE — ED Notes (Signed)
Patient resting with eyes closed

## 2011-12-19 NOTE — H&P (Signed)
[name] MRN: [id number] DOB/AGE: [birth year] 78 y.o. Primary Care Physician:No primary provider on file. Admit date: 12/19/2011 Chief Complaint: chest pain  HPI:78 year old lady who complains of left anterior chest pain. She says it has been going on for 3 months, but is worse today. She felt somewhat dizzy today also.Patient brought in from an Assisted Living Facility by EMS. She reports that she began feeling dizzy this morning while walking. She states that she fell. She was ambulatory after the fall. At this time she denies any pain in her neck. She reports that the chest pain has been present for the past 3 months and is unchanged. Pain worse with palpation. She denies any nausea, vomiting, or diaphoresis. No numbness or tingling. She denies any fever chills rigors or cough. She denies any hematocrit of melena hematochezia. She denies any dysarthria slurred speech or unilateral weakness of the   She says that she has a stent in her heart. Review of prior discharge summaries shows she had a cardiac catheterization by Dr. Nanetta Batty in 2007 that showed nonobstructive coronary artery diseaseEF of 65%. She had PCI done in 2005.  She localizes her pain to the left lateral chest   Past Medical History  Diagnosis Date  . Atrial fibrillation   . Coronary artery disease   . Dementia   . Hypertension   . Hyperlipidemia   . Anxiety   . GERD (gastroesophageal reflux disease)   . CHF (congestive heart failure)   . Polyp, stomach 11/11/2006  . Aphakia of left eye   . Obese   . MI (myocardial infarction)     1. Extensive and includes history of coronary artery disease. The patient  had an IVUS-guided PCI of the AV groove circumflex stenosis by Dr. Allyson Sabal  on April 19, 2005. On March 07, 2004, she had another  catheterization that showed noncritical coronary artery disease with an  EF of 60% as well as 80% proximal right renal artery stenosis. This was  discovered on April 19, 2005.  She has history of ischemic  cardiomyopathy with some congestive heart failure. Her EF is unknown.  2. Recent acute renal failure that has resolved.  3. Leukocytosis.  4. GERD.  5. Obstructive sleep apnea.  6. Hypertension.  7. Dyslipidemia.  8. Degenerative joint disease.  9. Chronic constipation.  10. History of colon polyps.  11. Internal hemorrhoids and diverticulosis according to Dr. Virginia Rochester in January  2005.  12. Bilateral cataracts.  14. History of frequent kidney stones.  15. History of right ankle fracture.  16. The patient is status post bladder tumor removal, status post bladder  tacking surgery.  17. Status post appendectomy.  18. Status post right breast lumpectomy.  19. Status post tonsillectomy.  20. Status post hysterectomy.  21. Status post back surgery three times.   Past Surgical History as highlighted above   Procedure Date  . Immature cataract     left eye    Prior to Admission medications   Medication Sig Start Date End Date Taking? Authorizing Provider  acetaminophen (TYLENOL) 325 MG tablet Take 650 mg by mouth 3 (three) times daily.   Yes Historical Provider, MD  amLODipine (NORVASC) 5 MG tablet Take 5 mg by mouth daily.   Yes Historical Provider, MD  calcium-vitamin D (OSCAL WITH D) 500-200 MG-UNIT per tablet Take 1 tablet by mouth daily.   Yes Historical Provider, MD  cetirizine (ZYRTEC) 10 MG tablet Take 10 mg by mouth daily.   Yes  Historical Provider, MD  cholecalciferol (VITAMIN D) 1000 UNITS tablet Take 1,000 Units by mouth daily.   Yes Historical Provider, MD  citalopram (CELEXA) 20 MG tablet Take 20 mg by mouth daily.   Yes Historical Provider, MD  clonazePAM (KLONOPIN) 1 MG tablet Take 0.5 mg by mouth daily as needed. For anxiety   Yes Historical Provider, MD  cloNIDine (CATAPRES) 0.2 MG tablet Take 0.2 mg by mouth every 6 (six) hours as needed. For elevated blood pressure if SBP is >180 or DBP >100.   Yes Historical Provider, MD  cloNIDine  (CATAPRES) 0.2 MG tablet Take 0.2 mg by mouth at bedtime.   Yes Historical Provider, MD  diclofenac sodium (VOLTAREN) 1 % GEL Apply 1 application topically every 6 (six) hours as needed. Apply 4 grams to affected area. May keep in room.   Yes Historical Provider, MD  diltiazem (CARDIZEM) 120 MG tablet Take 120 mg by mouth 2 (two) times daily.   Yes Historical Provider, MD  docusate sodium (COLACE) 100 MG capsule Take 100 mg by mouth 2 (two) times daily.   Yes Historical Provider, MD  esomeprazole (NEXIUM) 40 MG capsule Take 40 mg by mouth daily before breakfast.   Yes Historical Provider, MD  ezetimibe (ZETIA) 10 MG tablet Take 10 mg by mouth at bedtime.   Yes Historical Provider, MD  fenofibrate (TRICOR) 48 MG tablet Take 48 mg by mouth at bedtime.   Yes Historical Provider, MD  fluticasone (FLONASE) 50 MCG/ACT nasal spray Place 2 sprays into the nose daily.   Yes Historical Provider, MD  HYDROcodone-acetaminophen (NORCO) 5-325 MG per tablet Take 1 tablet by mouth 2 (two) times daily. For pain   Yes Historical Provider, MD  hydrocortisone (HEMORRHOIDAL-HC) 25 MG suppository Place 25 mg rectally 2 (two) times daily.   Yes Historical Provider, MD  isosorbide mononitrate (IMDUR) 30 MG 24 hr tablet Take 30 mg by mouth daily.   Yes Historical Provider, MD  ketorolac (ACULAR) 0.4 % SOLN Place 1 drop into the right eye 2 (two) times daily.   Yes Historical Provider, MD  losartan (COZAAR) 50 MG tablet Take 50 mg by mouth daily.   Yes Historical Provider, MD  metoCLOPramide (REGLAN) 5 MG tablet Take 5 mg by mouth 4 (four) times daily -  before meals and at bedtime.    Yes Historical Provider, MD  metoprolol succinate (TOPROL-XL) 50 MG 24 hr tablet Take 75 mg by mouth daily.   Yes Historical Provider, MD  nitroGLYCERIN (NITROLINGUAL) 0.4 MG/SPRAY spray Place 2 sprays under the tongue every 5 (five) minutes as needed. Use 2 sprays under tongue as needed for chest pain. Repeat 2 sprays if pain persists after 15  minutes if pain still persists   Yes Historical Provider, MD  nystatin (MYCOSTATIN/NYSTOP) 100000 UNIT/GM POWD Apply topically 3 (three) times daily. Apply under breast bilaterally after applying Nystatin cream for yeast rash.  Once rash has resolved, continue powder twice daily.   Yes Historical Provider, MD  nystatin cream (MYCOSTATIN) Apply 1 application topically 3 (three) times daily. Apply under both breasts.   Yes Historical Provider, MD  Olopatadine HCl (PATADAY) 0.2 % SOLN Place 1 drop into both eyes daily.   Yes Historical Provider, MD  oxybutynin (DITROPAN) 5 MG tablet Take 5 mg by mouth daily.   Yes Historical Provider, MD  polyethylene glycol (MIRALAX / GLYCOLAX) packet Take 17 g by mouth as directed. Mix 1 capful in 8oz of juice/water and drink 3 times each week  Yes Historical Provider, MD  potassium chloride (K-DUR) 10 MEQ tablet Take 10 mEq by mouth 2 (two) times daily. Do not crush   Yes Historical Provider, MD  rosuvastatin (CRESTOR) 5 MG tablet Take 5 mg by mouth at bedtime.    Yes Historical Provider, MD  sitaGLIPtin (JANUVIA) 50 MG tablet Take 50 mg by mouth daily.   Yes Historical Provider, MD  spironolactone (ALDACTONE) 25 MG tablet Take 25 mg by mouth daily.   Yes Historical Provider, MD  sucralfate (CARAFATE) 1 G tablet Take 1 g by mouth 4 (four) times daily.   Yes Historical Provider, MD    Allergies: No Known Allergies    SOCIAL HISTORY: The patient lives at  assisted living  facility. She had no children and has been widowed. No history of alcohol  or tobacco use. The patient uses a walker to move around.    FAMILY HISTORY: Her mother died of rectal cancer. Mother died of lung  cancer. No siblings are sick.     ROS: A complete 14 point review of systems was done with pertinent positives listed in history of present illness, all other review of systems were found to be negative  PHYSICAL EXAM: Blood pressure 181/69, pulse 52, temperature 99 F (37.2 C),  temperature source Oral, resp. rate 16, SpO2 98.00%. Nursing note and vitals reviewed.  Constitutional: She is oriented to person, place, and time. She appears well-developed and well-nourished. No distress.  HENT:  Head: Normocephalic and atraumatic.  Mouth/Throat: Oropharynx is clear and moist.  Neck: Normal range of motion. Neck supple.  Cardiovascular: Normal rate, regular rhythm and normal heart sounds.  Pulmonary/Chest: Effort normal and breath sounds normal. No respiratory distress. She has no wheezes. She exhibits tenderness.  Abdominal: Soft. There is no tenderness.  Musculoskeletal: Normal range of motion.  Neurological: She is alert and oriented to person, place, and time. No cranial nerve deficit or sensory deficit.  Skin: Skin is warm and dry. She is not diaphoretic. No erythema.  Psychiatric: She has a normal mood and affect.    No results found for this or any previous visit (from the past 240 hour(s)).   Results for orders placed during the hospital encounter of 12/19/11 (from the past 48 hour(s))  TROPONIN I     Status: Normal   Collection Time   12/19/11  8:38 AM      Component Value Range Comment   Troponin I <0.30  <0.30 ng/mL   CBC     Status: Normal   Collection Time   12/19/11  8:39 AM      Component Value Range Comment   WBC 4.4  4.0 - 10.5 K/uL    RBC 4.21  3.87 - 5.11 MIL/uL    Hemoglobin 12.0  12.0 - 15.0 g/dL    HCT 16.1  09.6 - 04.5 %    MCV 86.2  78.0 - 100.0 fL    MCH 28.5  26.0 - 34.0 pg    MCHC 33.1  30.0 - 36.0 g/dL    RDW 40.9  81.1 - 91.4 %    Platelets 186  150 - 400 K/uL   DIFFERENTIAL     Status: Normal   Collection Time   12/19/11  8:39 AM      Component Value Range Comment   Neutrophils Relative 54  43 - 77 %    Neutro Abs 2.4  1.7 - 7.7 K/uL    Lymphocytes Relative 30  12 - 46 %  Lymphs Abs 1.3  0.7 - 4.0 K/uL    Monocytes Relative 11  3 - 12 %    Monocytes Absolute 0.5  0.1 - 1.0 K/uL    Eosinophils Relative 5  0 - 5 %     Eosinophils Absolute 0.2  0.0 - 0.7 K/uL    Basophils Relative 1  0 - 1 %    Basophils Absolute 0.0  0.0 - 0.1 K/uL   BASIC METABOLIC PANEL     Status: Abnormal   Collection Time   12/19/11  8:39 AM      Component Value Range Comment   Sodium 137  135 - 145 mEq/L    Potassium 4.4  3.5 - 5.1 mEq/L    Chloride 103  96 - 112 mEq/L    CO2 24  19 - 32 mEq/L    Glucose, Bld 106 (*) 70 - 99 mg/dL    BUN 27 (*) 6 - 23 mg/dL    Creatinine, Ser 1.61 (*) 0.50 - 1.10 mg/dL    Calcium 9.7  8.4 - 09.6 mg/dL    GFR calc non Af Amer 42 (*) >90 mL/min    GFR calc Af Amer 49 (*) >90 mL/min   URINALYSIS, ROUTINE W REFLEX MICROSCOPIC     Status: Normal   Collection Time   12/19/11  8:59 AM      Component Value Range Comment   Color, Urine YELLOW  YELLOW    APPearance CLEAR  CLEAR    Specific Gravity, Urine 1.010  1.005 - 1.030    pH 7.0  5.0 - 8.0    Glucose, UA NEGATIVE  NEGATIVE mg/dL    Hgb urine dipstick NEGATIVE  NEGATIVE    Bilirubin Urine NEGATIVE  NEGATIVE    Ketones, ur NEGATIVE  NEGATIVE mg/dL    Protein, ur NEGATIVE  NEGATIVE mg/dL    Urobilinogen, UA 0.2  0.0 - 1.0 mg/dL    Nitrite NEGATIVE  NEGATIVE    Leukocytes, UA NEGATIVE  NEGATIVE MICROSCOPIC NOT DONE ON URINES WITH NEGATIVE PROTEIN, BLOOD, LEUKOCYTES, NITRITE, OR GLUCOSE <1000 mg/dL.  GLUCOSE, CAPILLARY     Status: Normal   Collection Time   12/19/11  8:59 AM      Component Value Range Comment   Glucose-Capillary 99  70 - 99 mg/dL   TROPONIN I     Status: Normal   Collection Time   12/19/11  9:50 AM      Component Value Range Comment   Troponin I <0.30  <0.30 ng/mL     Dg Chest 2 View  12/19/2011  *RADIOLOGY REPORT*  Clinical Data: Fall.  Left-sided chest pain.  Shortness of breath. Palpitations.  CHEST - 2 VIEW  Comparison: Two-view chest x-ray 08/30/2011, 10/13/2009, 09/20/2008.  Findings: Cardiac silhouette enlarged but stable.  Thoracic aorta tortuous and atherosclerotic, unchanged.  Tortuous innominate vessels account  for right paratracheal opacity, unchanged.  Hilar and mediastinal contours otherwise unremarkable.  Lungs clear. Bronchovascular markings normal.  Pulmonary vascularity normal.  No pneumothorax.  No pleural effusions.  Visualized bony thorax intact.  IMPRESSION: Stable cardiomegaly.  No acute cardiopulmonary disease.  Original Report Authenticated By: Arnell Sieving, M.D.    Impression:  #1 chest pain will admit the patient to telemetry, this is likely noncardiac however given the patient's history she needs to be monitored closely. We will continue with beta blocker and initiate aspirin, nitro paste, and repeat 2-D echo will be obtained. If abnormal a cardiology consultation will be obtained  #  2 history of atrial fibrillation currently not on anticoagulation technique a fall risk #3 hypertension the patient is on multiple medications including isosorbide, metoprolol, losartan, these will be continued #4 dyslipidemia the patient will be continued on her outpatient regimen and a lipid panel will be checked      [facility] 12/19/2011, 11:43 AM

## 2011-12-20 ENCOUNTER — Inpatient Hospital Stay (HOSPITAL_COMMUNITY): Payer: Medicare Other

## 2011-12-20 ENCOUNTER — Encounter (HOSPITAL_COMMUNITY): Payer: Self-pay | Admitting: Cardiology

## 2011-12-20 DIAGNOSIS — I2510 Atherosclerotic heart disease of native coronary artery without angina pectoris: Secondary | ICD-10-CM

## 2011-12-20 DIAGNOSIS — R001 Bradycardia, unspecified: Secondary | ICD-10-CM

## 2011-12-20 DIAGNOSIS — E782 Mixed hyperlipidemia: Secondary | ICD-10-CM

## 2011-12-20 DIAGNOSIS — E119 Type 2 diabetes mellitus without complications: Secondary | ICD-10-CM | POA: Diagnosis present

## 2011-12-20 DIAGNOSIS — Z9989 Dependence on other enabling machines and devices: Secondary | ICD-10-CM | POA: Diagnosis present

## 2011-12-20 DIAGNOSIS — R079 Chest pain, unspecified: Secondary | ICD-10-CM

## 2011-12-20 DIAGNOSIS — I10 Essential (primary) hypertension: Secondary | ICD-10-CM

## 2011-12-20 DIAGNOSIS — E1165 Type 2 diabetes mellitus with hyperglycemia: Secondary | ICD-10-CM

## 2011-12-20 DIAGNOSIS — E785 Hyperlipidemia, unspecified: Secondary | ICD-10-CM

## 2011-12-20 DIAGNOSIS — G4733 Obstructive sleep apnea (adult) (pediatric): Secondary | ICD-10-CM

## 2011-12-20 HISTORY — DX: Chest pain, unspecified: R07.9

## 2011-12-20 HISTORY — DX: Essential (primary) hypertension: I10

## 2011-12-20 HISTORY — DX: Atherosclerotic heart disease of native coronary artery without angina pectoris: I25.10

## 2011-12-20 HISTORY — DX: Bradycardia, unspecified: R00.1

## 2011-12-20 HISTORY — DX: Obstructive sleep apnea (adult) (pediatric): G47.33

## 2011-12-20 HISTORY — DX: Hyperlipidemia, unspecified: E78.5

## 2011-12-20 LAB — BASIC METABOLIC PANEL
BUN: 30 mg/dL — ABNORMAL HIGH (ref 6–23)
Calcium: 9.7 mg/dL (ref 8.4–10.5)
GFR calc non Af Amer: 45 mL/min — ABNORMAL LOW (ref >90)
Glucose, Bld: 118 mg/dL — ABNORMAL HIGH (ref 70–99)
Sodium: 139 mEq/L (ref 135–145)

## 2011-12-20 LAB — CBC
MCH: 28.8 pg (ref 26.0–34.0)
MCHC: 33.1 g/dL (ref 30.0–36.0)
Platelets: 218 10*3/uL (ref 150–400)
RBC: 4.1 MIL/uL (ref 3.87–5.11)

## 2011-12-20 LAB — GLUCOSE, CAPILLARY: Glucose-Capillary: 84 mg/dL (ref 70–99)

## 2011-12-20 LAB — CARDIAC PANEL(CRET KIN+CKTOT+MB+TROPI)
CK, MB: 3.3 ng/mL (ref 0.3–4.0)
Total CK: 134 U/L (ref 7–177)
Troponin I: <0.3 ng/mL (ref <0.30)

## 2011-12-20 LAB — MRSA PCR SCREENING: MRSA by PCR: POSITIVE — AB

## 2011-12-20 MED ORDER — ISOSORBIDE MONONITRATE ER 30 MG PO TB24
30.0000 mg | ORAL_TABLET | Freq: Two times a day (BID) | ORAL | Status: DC
  Administered 2011-12-20 – 2011-12-22 (×4): 30 mg via ORAL
  Filled 2011-12-20 (×5): qty 1

## 2011-12-20 MED ORDER — CHLORHEXIDINE GLUCONATE CLOTH 2 % EX PADS
6.0000 | MEDICATED_PAD | Freq: Every day | CUTANEOUS | Status: DC
  Administered 2011-12-21: 6 via TOPICAL

## 2011-12-20 MED ORDER — SODIUM CHLORIDE 0.9 % IV SOLN: 1.0000 mL/kg/h | INTRAVENOUS | Status: DC

## 2011-12-20 MED ORDER — MUPIROCIN 2 % EX OINT
1.0000 "application " | TOPICAL_OINTMENT | Freq: Two times a day (BID) | CUTANEOUS | Status: DC
  Administered 2011-12-20 – 2011-12-22 (×4): 1 via NASAL
  Filled 2011-12-20: qty 22

## 2011-12-20 MED ORDER — HEPARIN (PORCINE) IN NACL 100-0.45 UNIT/ML-% IJ SOLN
850.0000 [IU]/h | INTRAMUSCULAR | Status: DC
  Administered 2011-12-20: 850 [IU]/h via INTRAVENOUS
  Filled 2011-12-20: qty 250

## 2011-12-20 MED ORDER — SODIUM CHLORIDE 0.9 % IV SOLN: 250.0000 mL | INTRAVENOUS | Status: DC | PRN

## 2011-12-20 MED ORDER — PANTOPRAZOLE SODIUM 40 MG PO TBEC
40.0000 mg | DELAYED_RELEASE_TABLET | Freq: Two times a day (BID) | ORAL | Status: DC
  Administered 2011-12-20 – 2011-12-22 (×4): 40 mg via ORAL
  Filled 2011-12-20: qty 1
  Filled 2011-12-20: qty 26
  Filled 2011-12-20 (×2): qty 1

## 2011-12-20 MED ORDER — ASPIRIN 81 MG PO CHEW
324.0000 mg | CHEWABLE_TABLET | ORAL | Status: AC
  Administered 2011-12-21: 324 mg via ORAL
  Filled 2011-12-20: qty 4

## 2011-12-20 MED ORDER — HEPARIN BOLUS VIA INFUSION
3500.0000 [IU] | Freq: Once | INTRAVENOUS | Status: AC
  Administered 2011-12-20: 3500 [IU] via INTRAVENOUS
  Filled 2011-12-20: qty 3500

## 2011-12-20 MED ORDER — SODIUM CHLORIDE 0.9 % IJ SOLN: 3.0000 mL | INTRAMUSCULAR | Status: DC | PRN

## 2011-12-20 MED ORDER — DILTIAZEM HCL 60 MG PO TABS
60.0000 mg | ORAL_TABLET | Freq: Two times a day (BID) | ORAL | Status: DC
  Filled 2011-12-20: qty 1

## 2011-12-20 MED ORDER — DIAZEPAM 2 MG PO TABS
2.0000 mg | ORAL_TABLET | ORAL | Status: AC
Start: 2011-12-21 — End: 2011-12-21
  Administered 2011-12-21: 2 mg via ORAL
  Filled 2011-12-20: qty 1

## 2011-12-20 MED ORDER — SODIUM CHLORIDE 0.9 % IJ SOLN
3.0000 mL | Freq: Two times a day (BID) | INTRAMUSCULAR | Status: DC
  Administered 2011-12-20: 3 mL via INTRAVENOUS

## 2011-12-20 NOTE — Progress Notes (Signed)
We will take on our service due to cardiac issues.  Thank you.

## 2011-12-20 NOTE — Progress Notes (Signed)
Subjective: She states that she is continuing to have chest pain Denies any dizziness despite the bradycardia last night However she was dizzy and home  Objective: Vital signs in last 24 hours: Filed Vitals:   12/20/11 0537 12/20/11 0617 12/20/11 0803 12/20/11 0825  BP: 146/46 142/62  124/61  Pulse: 47 50  43  Temp: 98.3 F (36.8 C)     TempSrc: Oral     Resp: 18     Height:      Weight: 88.8 kg (195 lb 12.3 oz)     SpO2: 97%  98% 95%    Intake/Output Summary (Last 24 hours) at 12/20/11 1154 Last data filed at 12/20/11 1022  Gross per 24 hour  Intake    223 ml  Output    951 ml  Net   -728 ml    Weight change:   HENT:  Head: Normocephalic and atraumatic.  Mouth/Throat: Oropharynx is clear and moist.  Neck: Normal range of motion. Neck supple.  Cardiovascular: Normal rate, regular rhythm and normal heart sounds.  Pulmonary/Chest: Effort normal and breath sounds normal. No respiratory distress. She has no wheezes. She exhibits tenderness.  Abdominal: Soft. There is no tenderness.  Musculoskeletal: Normal range of motion.  Neurological: She is alert and oriented to person, place, and time. No cranial nerve deficit or sensory deficit.  Skin: Skin is warm and dry. She is not diaphoretic. No erythema.  Psychiatric: She has a normal mood and affect.    Lab Results: Results for orders placed during the hospital encounter of 12/19/11 (from the past 24 hour(s))  CBC     Status: Normal   Collection Time   12/19/11  3:15 PM      Component Value Range   WBC 6.3  4.0 - 10.5 K/uL   RBC 4.50  3.87 - 5.11 MIL/uL   Hemoglobin 12.9  12.0 - 15.0 g/dL   HCT 47.8  29.5 - 62.1 %   MCV 85.8  78.0 - 100.0 fL   MCH 28.7  26.0 - 34.0 pg   MCHC 33.4  30.0 - 36.0 g/dL   RDW 30.8  65.7 - 84.6 %   Platelets 208  150 - 400 K/uL  MAGNESIUM     Status: Normal   Collection Time   12/19/11  3:15 PM      Component Value Range   Magnesium 1.8  1.5 - 2.5 mg/dL  TSH     Status: Normal   Collection Time   12/19/11  3:15 PM      Component Value Range   TSH 0.738  0.350 - 4.500 uIU/mL  GLUCOSE, CAPILLARY     Status: Abnormal   Collection Time   12/19/11  3:39 PM      Component Value Range   Glucose-Capillary 104 (*) 70 - 99 mg/dL  CARDIAC PANEL(CRET KIN+CKTOT+MB+TROPI)     Status: Normal   Collection Time   12/19/11  3:45 PM      Component Value Range   Total CK 170  7 - 177 U/L   CK, MB 3.9  0.3 - 4.0 ng/mL   Troponin I <0.30  <0.30 ng/mL   Relative Index 2.3  0.0 - 2.5  GLUCOSE, CAPILLARY     Status: Abnormal   Collection Time   12/19/11  9:38 PM      Component Value Range   Glucose-Capillary 120 (*) 70 - 99 mg/dL   Comment 1 Notify RN    CARDIAC PANEL(CRET KIN+CKTOT+MB+TROPI)  Status: Normal   Collection Time   12/19/11 10:57 PM      Component Value Range   Total CK 146  7 - 177 U/L   CK, MB 3.3  0.3 - 4.0 ng/mL   Troponin I <0.30  <0.30 ng/mL   Relative Index 2.3  0.0 - 2.5  GLUCOSE, CAPILLARY     Status: Abnormal   Collection Time   12/20/11  5:49 AM      Component Value Range   Glucose-Capillary 110 (*) 70 - 99 mg/dL   Comment 1 Notify RN     Comment 2 Documented in Chart    MRSA PCR SCREENING     Status: Abnormal   Collection Time   12/20/11  6:58 AM      Component Value Range   MRSA by PCR POSITIVE (*) NEGATIVE  BASIC METABOLIC PANEL     Status: Abnormal   Collection Time   12/20/11  8:07 AM      Component Value Range   Sodium 139  135 - 145 mEq/L   Potassium 4.6  3.5 - 5.1 mEq/L   Chloride 105  96 - 112 mEq/L   CO2 23  19 - 32 mEq/L   Glucose, Bld 118 (*) 70 - 99 mg/dL   BUN 30 (*) 6 - 23 mg/dL   Creatinine, Ser 6.57  0.50 - 1.10 mg/dL   Calcium 9.7  8.4 - 84.6 mg/dL   GFR calc non Af Amer 45 (*) >90 mL/min   GFR calc Af Amer 52 (*) >90 mL/min  CBC     Status: Abnormal   Collection Time   12/20/11  8:07 AM      Component Value Range   WBC 5.7  4.0 - 10.5 K/uL   RBC 4.10  3.87 - 5.11 MIL/uL   Hemoglobin 11.8 (*) 12.0 - 15.0 g/dL   HCT  96.2 (*) 95.2 - 46.0 %   MCV 86.8  78.0 - 100.0 fL   MCH 28.8  26.0 - 34.0 pg   MCHC 33.1  30.0 - 36.0 g/dL   RDW 84.1  32.4 - 40.1 %   Platelets 218  150 - 400 K/uL  CARDIAC PANEL(CRET KIN+CKTOT+MB+TROPI)     Status: Normal   Collection Time   12/20/11  8:13 AM      Component Value Range   Total CK 134  7 - 177 U/L   CK, MB 3.0  0.3 - 4.0 ng/mL   Troponin I <0.30  <0.30 ng/mL   Relative Index 2.2  0.0 - 2.5  GLUCOSE, CAPILLARY     Status: Normal   Collection Time   12/20/11 11:25 AM      Component Value Range   Glucose-Capillary 97  70 - 99 mg/dL     Micro: Recent Results (from the past 240 hour(s))  MRSA PCR SCREENING     Status: Abnormal   Collection Time   12/20/11  6:58 AM      Component Value Range Status Comment   MRSA by PCR POSITIVE (*) NEGATIVE Final     Studies/Results: Dg Chest 2 View  12/19/2011  *RADIOLOGY REPORT*  Clinical Data: Fall.  Left-sided chest pain.  Shortness of breath. Palpitations.  CHEST - 2 VIEW  Comparison: Two-view chest x-ray 08/30/2011, 10/13/2009, 09/20/2008.  Findings: Cardiac silhouette enlarged but stable.  Thoracic aorta tortuous and atherosclerotic, unchanged.  Tortuous innominate vessels account for right paratracheal opacity, unchanged.  Hilar and mediastinal contours otherwise unremarkable.  Lungs  clear. Bronchovascular markings normal.  Pulmonary vascularity normal.  No pneumothorax.  No pleural effusions.  Visualized bony thorax intact.  IMPRESSION: Stable cardiomegaly.  No acute cardiopulmonary disease.  Original Report Authenticated By: Arnell Sieving, M.D.   Ct Head Wo Contrast  12/19/2011  *RADIOLOGY REPORT*  Clinical Data: Larey Seat.  Hit head.  CT HEAD WITHOUT CONTRAST  Technique:  Contiguous axial images were obtained from the base of the skull through the vertex without contrast.  Comparison: 12/20/2010.  Findings: Stable age related cerebral atrophy, ventriculomegaly and periventricular white matter disease.  No extra-axial fluid  collections are identified.  No CT findings for acute hemispheric infarction and/or intracranial hemorrhage.  The brainstem and cerebellum grossly normal and stable.  There is chronic pansinus disease.  The calvarium is intact.  The mastoid air cells and middle ear cavities are clear.  IMPRESSION:  1.  Stable age related cerebral atrophy, ventriculomegaly and periventricular white matter disease. 2.  No acute intracranial findings. 3.  Chronic pansinusitis.  Original Report Authenticated By: P. Loralie Champagne, M.D.    Medications:  Scheduled Meds:   . amLODipine  5 mg Oral Daily  . aspirin EC  81 mg Oral Daily  . atorvastatin  10 mg Oral q1800  . citalopram  20 mg Oral Daily  . clonazePAM  0.5 mg Oral BID  . cloNIDine  0.2 mg Oral QHS  . docusate sodium  100 mg Oral BID  . enoxaparin  40 mg Subcutaneous Q24H  . ezetimibe  10 mg Oral QHS  . fenofibrate  54 mg Oral Daily  . HYDROcodone-acetaminophen  1 tablet Oral BID  . hydrocortisone  25 mg Rectal BID  . isosorbide mononitrate  30 mg Oral Daily  . levalbuterol  0.63 mg Nebulization Q6H  . linagliptin  5 mg Oral Daily  . losartan  50 mg Oral Daily  . nystatin   Topical TID  . olopatadine  1 drop Both Eyes BID  . oxybutynin  5 mg Oral Daily  . pantoprazole  40 mg Oral Daily  . potassium chloride  10 mEq Oral BID  . sodium chloride  3 mL Intravenous Q12H  . spironolactone  25 mg Oral Daily  . sucralfate  1 g Oral QID  . DISCONTD: diltiazem  120 mg Oral Q12H  . DISCONTD: diltiazem  120 mg Oral BID  . DISCONTD: diltiazem  60 mg Oral Q6H  . DISCONTD: diltiazem  60 mg Oral Q12H  . DISCONTD: metoprolol succinate  75 mg Oral Daily  . DISCONTD: Olopatadine HCl  1 drop Both Eyes Daily   Continuous Infusions:   . sodium chloride     PRN Meds:.acetaminophen, acetaminophen, cloNIDine, HYDROcodone-acetaminophen, nitroGLYCERIN, ondansetron (ZOFRAN) IV, ondansetron   Assessment:   #1 chest pain will admit the patient to telemetry, this  is likely noncardiac however given the patient's history she needs to be monitored closely. Beta blocker and diltiazem placed on hold because of bradycardia will continue aspirin, nitro paste, and repeat 2-D echo will be obtained. Will consult Dr. Nanetta Batty because of persistent chest pain  #2 history of atrial fibrillation currently not on anticoagulation technique a fall risk , hold metoprolol, diltiazem because of bradycardia #3 hypertension the patient is on multiple medications including isosorbide, hold metoprolol, losartan, these will be continued on a when necessary hydralazine  #4 dyslipidemia the patient will be continued on her outpatient regimen and a lipid panel will be checked    #5 dizziness given history of atrial fibrillation,  we'll do an MRI to rule out CVA  #6 Diabetes will add Accu-Cheks and sliding scale insulin       LOS: 1 day   [facility] 12/20/2011, 11:54 AM

## 2011-12-20 NOTE — Progress Notes (Signed)
0820 was called in by monitor tech with  Cardiac monitoring to Sinusbdrady . borke spontaneously to SB 40's / pt denied dizziness or chest pain /

## 2011-12-20 NOTE — ED Provider Notes (Signed)
Medical screening examination/treatment/procedure(s) were conducted as a shared visit with non-physician practitioner(s) and myself.  I personally evaluated the patient during the encounter Medical screening examination/treatment/procedure(s) were conducted as a shared visit with non-physician practitioner(s) and myself. I personally evaluated the patient during the encounter  Pt is an 78 year old lady who complains of left anterior chest pain. She says it has been going on for 3 months, but is worse today. She felt somewhat dizzy today also. She says that she has a stent in her heart. Review of prior discharge summaries shows she had a cardiac catheterization by Dr. Nanetta Batty in 2007 that showed nonobstructive coronary artery disease. Exam today shows her to be an elderly woman in no distress at rest. She localizes her pain to the left lateral chest. There is no point tenderness. Heart sounds normal, lungs clear, EKG benign, initial TNI negative. Recommend three hour TNI. If she continues to complain of chest pain, will request admission for cardiac R/O.   Carleene Cooper III, MD 12/20/11 2041

## 2011-12-20 NOTE — Consult Note (Signed)
ANTICOAGULATION CONSULT NOTE - Initial Consult  Pharmacy Consult for Heparin Indication: chest pain/ACS  Allergies  Allergen Reactions  . Chicken Allergy   . Fish Allergy     Patient Measurements: Height: 5\' 3"  (160 cm) Weight: 195 lb 12.3 oz (88.8 kg) (scale C) IBW/kg (Calculated) : 52.4  Heparin dosing weight:  72.6 kg  Vital Signs: Temp: 97.5 F (36.4 C) (06/24 1421) Temp src: Oral (06/24 1421) BP: 143/78 mmHg (06/24 1421) Pulse Rate: 50  (06/24 1421)  Labs:  Alvira Philips 12/20/11 0813 12/20/11 0807 12/19/11 2257 12/19/11 1545 12/19/11 1515 12/19/11 0839  HGB -- 11.8* -- -- 12.9 --  HCT -- 35.6* -- -- 38.6 36.3  PLT -- 218 -- -- 208 186  APTT -- -- -- -- -- --  LABPROT -- -- -- -- -- --  INR -- -- -- -- -- --  HEPARINUNFRC -- -- -- -- -- --  CREATININE -- 1.10 -- -- -- 1.16*  CKTOTAL 134 -- 146 170 -- --  CKMB 3.0 -- 3.3 3.9 -- --  TROPONINI <0.30 -- <0.30 <0.30 -- --   Estimated Creatinine Clearance: 41 ml/min (by C-G formula based on Cr of 1.1).  Medical / Surgical History: Past Medical History  Diagnosis Date  . Atrial fibrillation   . Coronary artery disease   . Dementia   . Hypertension   . Hyperlipidemia   . Anxiety   . GERD (gastroesophageal reflux disease)   . CHF (congestive heart failure)   . Polyp, stomach 11/11/2006  . Aphakia of left eye   . Obese   . MI (myocardial infarction)   . Sleep apnea   . Chest pain at rest 12/20/2011  . CAD (coronary artery disease), cutting balloon athrectomy of her dominant AV groove of LCX.  2007 12/20/2011  . Bradycardia 12/20/2011  . OSA on CPAP 12/20/2011  . HTN (hypertension) 12/20/2011  . Dyslipidemia  12/20/2011   Past Surgical History  Procedure Date  . Immature cataract     left eye  . Back surgery   . Appendectomy   . Breast surgery   . Fracture surgery   . Dilation and curettage of uterus   . Abdominal hysterectomy   . Coronary angioplasty     Medications:  Prescriptions prior to admission    Medication Sig Dispense Refill  . acetaminophen (TYLENOL) 325 MG tablet Take 650 mg by mouth 3 (three) times daily.      Marland Kitchen amLODipine (NORVASC) 5 MG tablet Take 5 mg by mouth daily.      . calcium-vitamin D (OSCAL WITH D) 500-200 MG-UNIT per tablet Take 1 tablet by mouth daily.      . cetirizine (ZYRTEC) 10 MG tablet Take 10 mg by mouth daily.      . cholecalciferol (VITAMIN D) 1000 UNITS tablet Take 1,000 Units by mouth daily.      . citalopram (CELEXA) 20 MG tablet Take 20 mg by mouth daily.      . clonazePAM (KLONOPIN) 1 MG tablet Take 0.5 mg by mouth daily as needed. For anxiety      . cloNIDine (CATAPRES) 0.2 MG tablet Take 0.2 mg by mouth every 6 (six) hours as needed. For elevated blood pressure if SBP is >180 or DBP >100.      . cloNIDine (CATAPRES) 0.2 MG tablet Take 0.2 mg by mouth at bedtime.      . diclofenac sodium (VOLTAREN) 1 % GEL Apply 1 application topically every 6 (six) hours as needed.  Apply 4 grams to affected area. May keep in room.      Marland Kitchen diltiazem (CARDIZEM) 120 MG tablet Take 120 mg by mouth 2 (two) times daily.      Marland Kitchen docusate sodium (COLACE) 100 MG capsule Take 100 mg by mouth 2 (two) times daily.      Marland Kitchen esomeprazole (NEXIUM) 40 MG capsule Take 40 mg by mouth daily before breakfast.      . ezetimibe (ZETIA) 10 MG tablet Take 10 mg by mouth at bedtime.      . fenofibrate (TRICOR) 48 MG tablet Take 48 mg by mouth at bedtime.      . fluticasone (FLONASE) 50 MCG/ACT nasal spray Place 2 sprays into the nose daily.      Marland Kitchen HYDROcodone-acetaminophen (NORCO) 5-325 MG per tablet Take 1 tablet by mouth 2 (two) times daily. For pain      . hydrocortisone (HEMORRHOIDAL-HC) 25 MG suppository Place 25 mg rectally 2 (two) times daily.      . isosorbide mononitrate (IMDUR) 30 MG 24 hr tablet Take 30 mg by mouth daily.      Marland Kitchen ketorolac (ACULAR) 0.4 % SOLN Place 1 drop into the right eye 2 (two) times daily.      Marland Kitchen losartan (COZAAR) 50 MG tablet Take 50 mg by mouth daily.      .  metoCLOPramide (REGLAN) 5 MG tablet Take 5 mg by mouth 4 (four) times daily -  before meals and at bedtime.       . metoprolol succinate (TOPROL-XL) 50 MG 24 hr tablet Take 75 mg by mouth daily.      . nitroGLYCERIN (NITROLINGUAL) 0.4 MG/SPRAY spray Place 2 sprays under the tongue every 5 (five) minutes as needed. Use 2 sprays under tongue as needed for chest pain. Repeat 2 sprays if pain persists after 15 minutes if pain still persists      . nystatin (MYCOSTATIN/NYSTOP) 100000 UNIT/GM POWD Apply topically 3 (three) times daily. Apply under breast bilaterally after applying Nystatin cream for yeast rash.  Once rash has resolved, continue powder twice daily.      Marland Kitchen nystatin cream (MYCOSTATIN) Apply 1 application topically 3 (three) times daily. Apply under both breasts.      . Olopatadine HCl (PATADAY) 0.2 % SOLN Place 1 drop into both eyes daily.      Marland Kitchen oxybutynin (DITROPAN) 5 MG tablet Take 5 mg by mouth daily.      . polyethylene glycol (MIRALAX / GLYCOLAX) packet Take 17 g by mouth as directed. Mix 1 capful in 8oz of juice/water and drink 3 times each week      . potassium chloride (K-DUR) 10 MEQ tablet Take 10 mEq by mouth 2 (two) times daily. Do not crush      . rosuvastatin (CRESTOR) 5 MG tablet Take 5 mg by mouth at bedtime.       . sitaGLIPtin (JANUVIA) 50 MG tablet Take 50 mg by mouth daily.      Marland Kitchen spironolactone (ALDACTONE) 25 MG tablet Take 25 mg by mouth daily.      . sucralfate (CARAFATE) 1 G tablet Take 1 g by mouth 4 (four) times daily.       Scheduled:    . amLODipine  5 mg Oral Daily  . aspirin  324 mg Oral Pre-Cath  . aspirin EC  81 mg Oral Daily  . atorvastatin  10 mg Oral q1800  . citalopram  20 mg Oral Daily  . clonazePAM  0.5 mg  Oral BID  . cloNIDine  0.2 mg Oral QHS  . diazepam  2 mg Oral On Call  . docusate sodium  100 mg Oral BID  . ezetimibe  10 mg Oral QHS  . fenofibrate  54 mg Oral Daily  . HYDROcodone-acetaminophen  1 tablet Oral BID  . hydrocortisone  25 mg  Rectal BID  . isosorbide mononitrate  30 mg Oral BID  . levalbuterol  0.63 mg Nebulization Q6H  . linagliptin  5 mg Oral Daily  . losartan  50 mg Oral Daily  . nystatin   Topical TID  . olopatadine  1 drop Both Eyes BID  . oxybutynin  5 mg Oral Daily  . pantoprazole  40 mg Oral BID AC  . potassium chloride  10 mEq Oral BID  . sodium chloride  3 mL Intravenous Q12H  . sodium chloride  3 mL Intravenous Q12H  . spironolactone  25 mg Oral Daily  . sucralfate  1 g Oral QID  . DISCONTD: diltiazem  60 mg Oral Q6H  . DISCONTD: diltiazem  60 mg Oral Q12H  . DISCONTD: enoxaparin  40 mg Subcutaneous Q24H  . DISCONTD: isosorbide mononitrate  30 mg Oral Daily  . DISCONTD: metoprolol succinate  75 mg Oral Daily  . DISCONTD: pantoprazole  40 mg Oral Daily   Infusions:    . sodium chloride    . sodium chloride     Anti-infectives    None      Assessment:  77 y/o female with H/O CAD and remote Cath / cutting balloon atherectomy 02/2006.  She was scheduled for myoview later this week but was admitted today for accelerated CP.   Currently pain free. EKG without acute change Enz neg.   Will start iv hep. Plan cath tomorrow to define anatomy.   Goal of Therapy:   Heparin level 0.3-0.7 units/ml   Plan:   DC Lovenox.  Heparin 3500 unit bolus, then begin Heparin infusion at 850 units/hr.  Will check 8 hour Heparin level, CBC. Daily Heparin level and CBC while on Heparin.  Nicie Milan, Elisha Headland, Pharm.D.   12/20/2011 5:32 PM

## 2011-12-20 NOTE — Consult Note (Signed)
Reason for Consult: continued chest pain, bradycardia  Referring Physician: Dr. Antony Blackbird is an 78 y.o. female.    Chief Complaint:  Admitted with chest pain and known CAD  HPI: 78 year old lady who complains of left anterior chest pain. She says it has been going on for 3 months, but is worse today. She felt somewhat dizzy today also.Patient brought in from an Assisted Living Facility by EMS. She reports that she began feeling dizzy this morning while walking. She states that she fell. She was ambulatory after the fall. At this time she denies any pain in her neck. She reports that the chest pain has been present for the past 3 months and is unchanged. Pain worse with palpation. She denies any nausea, vomiting, or diaphoresis. No numbness or tingling. She denies any fever chills rigors or cough. She denies any hematocrit of melena hematochezia. She denies any dysarthria slurred speech or unilateral weakness of the  She localizes her pain to the left lateral chest.  History of CAD with cutting balloon atherectomy of her dominant AV groove LCX, 02/2006.  Was seen by Dr. Allyson Sabal 12/09/11 and was concerned about palpitations and pain and she was scheduled for both an event monitor and Lexiscan myoview on 12/23/11.     Negative MI, But HR is down to 30's at times.  EKG on admit SB at 50 with no acute changes.   Currently her metoprolol and cardizem have been d/c'd.    Past Medical History  Diagnosis Date  . Atrial fibrillation   . Coronary artery disease   . Dementia   . Hypertension   . Hyperlipidemia   . Anxiety   . GERD (gastroesophageal reflux disease)   . CHF (congestive heart failure)   . Polyp, stomach 11/11/2006  . Aphakia of left eye   . Obese   . MI (myocardial infarction)   . Sleep apnea   . Chest pain at rest 12/20/2011  . CAD (coronary artery disease), cutting balloon athrectomy of her dominant AV groove of LCX.  2007 12/20/2011  . Bradycardia 12/20/2011  . OSA on CPAP  12/20/2011  . HTN (hypertension) 12/20/2011  . Dyslipidemia  12/20/2011    Past Surgical History  Procedure Date  . Immature cataract     left eye  . Back surgery   . Appendectomy   . Breast surgery   . Fracture surgery   . Dilation and curettage of uterus   . Abdominal hysterectomy   . Coronary angioplasty     Family History  Problem Relation Age of Onset  . Colon cancer     Social History:  reports that she has never smoked. She has never used smokeless tobacco. She reports that she does not drink alcohol or use illicit drugs.  Allergies:  Allergies  Allergen Reactions  . Chicken Allergy   . Fish Allergy     Medications Prior to Admission  Medication Sig Dispense Refill  . acetaminophen (TYLENOL) 325 MG tablet Take 650 mg by mouth 3 (three) times daily.      Marland Kitchen amLODipine (NORVASC) 5 MG tablet Take 5 mg by mouth daily.      . calcium-vitamin D (OSCAL WITH D) 500-200 MG-UNIT per tablet Take 1 tablet by mouth daily.      . cetirizine (ZYRTEC) 10 MG tablet Take 10 mg by mouth daily.      . cholecalciferol (VITAMIN D) 1000 UNITS tablet Take 1,000 Units by mouth daily.      Marland Kitchen  citalopram (CELEXA) 20 MG tablet Take 20 mg by mouth daily.      . clonazePAM (KLONOPIN) 1 MG tablet Take 0.5 mg by mouth daily as needed. For anxiety      . cloNIDine (CATAPRES) 0.2 MG tablet Take 0.2 mg by mouth every 6 (six) hours as needed. For elevated blood pressure if SBP is >180 or DBP >100.      . cloNIDine (CATAPRES) 0.2 MG tablet Take 0.2 mg by mouth at bedtime.      . diclofenac sodium (VOLTAREN) 1 % GEL Apply 1 application topically every 6 (six) hours as needed. Apply 4 grams to affected area. May keep in room.      Marland Kitchen diltiazem (CARDIZEM) 120 MG tablet Take 120 mg by mouth 2 (two) times daily.      Marland Kitchen docusate sodium (COLACE) 100 MG capsule Take 100 mg by mouth 2 (two) times daily.      Marland Kitchen esomeprazole (NEXIUM) 40 MG capsule Take 40 mg by mouth daily before breakfast.      . ezetimibe (ZETIA)  10 MG tablet Take 10 mg by mouth at bedtime.      . fenofibrate (TRICOR) 48 MG tablet Take 48 mg by mouth at bedtime.      . fluticasone (FLONASE) 50 MCG/ACT nasal spray Place 2 sprays into the nose daily.      Marland Kitchen HYDROcodone-acetaminophen (NORCO) 5-325 MG per tablet Take 1 tablet by mouth 2 (two) times daily. For pain      . hydrocortisone (HEMORRHOIDAL-HC) 25 MG suppository Place 25 mg rectally 2 (two) times daily.      . isosorbide mononitrate (IMDUR) 30 MG 24 hr tablet Take 30 mg by mouth daily.      Marland Kitchen ketorolac (ACULAR) 0.4 % SOLN Place 1 drop into the right eye 2 (two) times daily.      Marland Kitchen losartan (COZAAR) 50 MG tablet Take 50 mg by mouth daily.      . metoCLOPramide (REGLAN) 5 MG tablet Take 5 mg by mouth 4 (four) times daily -  before meals and at bedtime.       . metoprolol succinate (TOPROL-XL) 50 MG 24 hr tablet Take 75 mg by mouth daily.      . nitroGLYCERIN (NITROLINGUAL) 0.4 MG/SPRAY spray Place 2 sprays under the tongue every 5 (five) minutes as needed. Use 2 sprays under tongue as needed for chest pain. Repeat 2 sprays if pain persists after 15 minutes if pain still persists      . nystatin (MYCOSTATIN/NYSTOP) 100000 UNIT/GM POWD Apply topically 3 (three) times daily. Apply under breast bilaterally after applying Nystatin cream for yeast rash.  Once rash has resolved, continue powder twice daily.      Marland Kitchen nystatin cream (MYCOSTATIN) Apply 1 application topically 3 (three) times daily. Apply under both breasts.      . Olopatadine HCl (PATADAY) 0.2 % SOLN Place 1 drop into both eyes daily.      Marland Kitchen oxybutynin (DITROPAN) 5 MG tablet Take 5 mg by mouth daily.      . polyethylene glycol (MIRALAX / GLYCOLAX) packet Take 17 g by mouth as directed. Mix 1 capful in 8oz of juice/water and drink 3 times each week      . potassium chloride (K-DUR) 10 MEQ tablet Take 10 mEq by mouth 2 (two) times daily. Do not crush      . rosuvastatin (CRESTOR) 5 MG tablet Take 5 mg by mouth at bedtime.       Marland Kitchen  sitaGLIPtin (JANUVIA) 50 MG tablet Take 50 mg by mouth daily.      Marland Kitchen spironolactone (ALDACTONE) 25 MG tablet Take 25 mg by mouth daily.      . sucralfate (CARAFATE) 1 G tablet Take 1 g by mouth 4 (four) times daily.        Results for orders placed during the hospital encounter of 12/19/11 (from the past 48 hour(s))  TROPONIN I     Status: Normal   Collection Time   12/19/11  8:38 AM      Component Value Range Comment   Troponin I <0.30  <0.30 ng/mL   CBC     Status: Normal   Collection Time   12/19/11  8:39 AM      Component Value Range Comment   WBC 4.4  4.0 - 10.5 K/uL    RBC 4.21  3.87 - 5.11 MIL/uL    Hemoglobin 12.0  12.0 - 15.0 g/dL    HCT 16.1  09.6 - 04.5 %    MCV 86.2  78.0 - 100.0 fL    MCH 28.5  26.0 - 34.0 pg    MCHC 33.1  30.0 - 36.0 g/dL    RDW 40.9  81.1 - 91.4 %    Platelets 186  150 - 400 K/uL   DIFFERENTIAL     Status: Normal   Collection Time   12/19/11  8:39 AM      Component Value Range Comment   Neutrophils Relative 54  43 - 77 %    Neutro Abs 2.4  1.7 - 7.7 K/uL    Lymphocytes Relative 30  12 - 46 %    Lymphs Abs 1.3  0.7 - 4.0 K/uL    Monocytes Relative 11  3 - 12 %    Monocytes Absolute 0.5  0.1 - 1.0 K/uL    Eosinophils Relative 5  0 - 5 %    Eosinophils Absolute 0.2  0.0 - 0.7 K/uL    Basophils Relative 1  0 - 1 %    Basophils Absolute 0.0  0.0 - 0.1 K/uL   BASIC METABOLIC PANEL     Status: Abnormal   Collection Time   12/19/11  8:39 AM      Component Value Range Comment   Sodium 137  135 - 145 mEq/L    Potassium 4.4  3.5 - 5.1 mEq/L    Chloride 103  96 - 112 mEq/L    CO2 24  19 - 32 mEq/L    Glucose, Bld 106 (*) 70 - 99 mg/dL    BUN 27 (*) 6 - 23 mg/dL    Creatinine, Ser 7.82 (*) 0.50 - 1.10 mg/dL    Calcium 9.7  8.4 - 95.6 mg/dL    GFR calc non Af Amer 42 (*) >90 mL/min    GFR calc Af Amer 49 (*) >90 mL/min   URINALYSIS, ROUTINE W REFLEX MICROSCOPIC     Status: Normal   Collection Time   12/19/11  8:59 AM      Component Value Range  Comment   Color, Urine YELLOW  YELLOW    APPearance CLEAR  CLEAR    Specific Gravity, Urine 1.010  1.005 - 1.030    pH 7.0  5.0 - 8.0    Glucose, UA NEGATIVE  NEGATIVE mg/dL    Hgb urine dipstick NEGATIVE  NEGATIVE    Bilirubin Urine NEGATIVE  NEGATIVE    Ketones, ur NEGATIVE  NEGATIVE mg/dL    Protein,  ur NEGATIVE  NEGATIVE mg/dL    Urobilinogen, UA 0.2  0.0 - 1.0 mg/dL    Nitrite NEGATIVE  NEGATIVE    Leukocytes, UA NEGATIVE  NEGATIVE MICROSCOPIC NOT DONE ON URINES WITH NEGATIVE PROTEIN, BLOOD, LEUKOCYTES, NITRITE, OR GLUCOSE <1000 mg/dL.  GLUCOSE, CAPILLARY     Status: Normal   Collection Time   12/19/11  8:59 AM      Component Value Range Comment   Glucose-Capillary 99  70 - 99 mg/dL   TROPONIN I     Status: Normal   Collection Time   12/19/11  9:50 AM      Component Value Range Comment   Troponin I <0.30  <0.30 ng/mL   CBC     Status: Normal   Collection Time   12/19/11  3:15 PM      Component Value Range Comment   WBC 6.3  4.0 - 10.5 K/uL    RBC 4.50  3.87 - 5.11 MIL/uL    Hemoglobin 12.9  12.0 - 15.0 g/dL    HCT 40.9  81.1 - 91.4 %    MCV 85.8  78.0 - 100.0 fL    MCH 28.7  26.0 - 34.0 pg    MCHC 33.4  30.0 - 36.0 g/dL    RDW 78.2  95.6 - 21.3 %    Platelets 208  150 - 400 K/uL   MAGNESIUM     Status: Normal   Collection Time   12/19/11  3:15 PM      Component Value Range Comment   Magnesium 1.8  1.5 - 2.5 mg/dL   TSH     Status: Normal   Collection Time   12/19/11  3:15 PM      Component Value Range Comment   TSH 0.738  0.350 - 4.500 uIU/mL   GLUCOSE, CAPILLARY     Status: Abnormal   Collection Time   12/19/11  3:39 PM      Component Value Range Comment   Glucose-Capillary 104 (*) 70 - 99 mg/dL   CARDIAC PANEL(CRET KIN+CKTOT+MB+TROPI)     Status: Normal   Collection Time   12/19/11  3:45 PM      Component Value Range Comment   Total CK 170  7 - 177 U/L    CK, MB 3.9  0.3 - 4.0 ng/mL    Troponin I <0.30  <0.30 ng/mL    Relative Index 2.3  0.0 - 2.5   GLUCOSE,  CAPILLARY     Status: Abnormal   Collection Time   12/19/11  9:38 PM      Component Value Range Comment   Glucose-Capillary 120 (*) 70 - 99 mg/dL    Comment 1 Notify RN     CARDIAC PANEL(CRET KIN+CKTOT+MB+TROPI)     Status: Normal   Collection Time   12/19/11 10:57 PM      Component Value Range Comment   Total CK 146  7 - 177 U/L    CK, MB 3.3  0.3 - 4.0 ng/mL    Troponin I <0.30  <0.30 ng/mL    Relative Index 2.3  0.0 - 2.5   GLUCOSE, CAPILLARY     Status: Abnormal   Collection Time   12/20/11  5:49 AM      Component Value Range Comment   Glucose-Capillary 110 (*) 70 - 99 mg/dL    Comment 1 Notify RN      Comment 2 Documented in Chart     MRSA PCR SCREENING  Status: Abnormal   Collection Time   12/20/11  6:58 AM      Component Value Range Comment   MRSA by PCR POSITIVE (*) NEGATIVE   BASIC METABOLIC PANEL     Status: Abnormal   Collection Time   12/20/11  8:07 AM      Component Value Range Comment   Sodium 139  135 - 145 mEq/L    Potassium 4.6  3.5 - 5.1 mEq/L    Chloride 105  96 - 112 mEq/L    CO2 23  19 - 32 mEq/L    Glucose, Bld 118 (*) 70 - 99 mg/dL    BUN 30 (*) 6 - 23 mg/dL    Creatinine, Ser 1.61  0.50 - 1.10 mg/dL    Calcium 9.7  8.4 - 09.6 mg/dL    GFR calc non Af Amer 45 (*) >90 mL/min    GFR calc Af Amer 52 (*) >90 mL/min   CBC     Status: Abnormal   Collection Time   12/20/11  8:07 AM      Component Value Range Comment   WBC 5.7  4.0 - 10.5 K/uL    RBC 4.10  3.87 - 5.11 MIL/uL    Hemoglobin 11.8 (*) 12.0 - 15.0 g/dL    HCT 04.5 (*) 40.9 - 46.0 %    MCV 86.8  78.0 - 100.0 fL    MCH 28.8  26.0 - 34.0 pg    MCHC 33.1  30.0 - 36.0 g/dL    RDW 81.1  91.4 - 78.2 %    Platelets 218  150 - 400 K/uL   CARDIAC PANEL(CRET KIN+CKTOT+MB+TROPI)     Status: Normal   Collection Time   12/20/11  8:13 AM      Component Value Range Comment   Total CK 134  7 - 177 U/L    CK, MB 3.0  0.3 - 4.0 ng/mL    Troponin I <0.30  <0.30 ng/mL    Relative Index 2.2  0.0 - 2.5     GLUCOSE, CAPILLARY     Status: Normal   Collection Time   12/20/11 11:25 AM      Component Value Range Comment   Glucose-Capillary 97  70 - 99 mg/dL    Dg Chest 2 View  9/56/2130  *RADIOLOGY REPORT*  Clinical Data: Fall.  Left-sided chest pain.  Shortness of breath. Palpitations.  CHEST - 2 VIEW  Comparison: Two-view chest x-ray 08/30/2011, 10/13/2009, 09/20/2008.  Findings: Cardiac silhouette enlarged but stable.  Thoracic aorta tortuous and atherosclerotic, unchanged.  Tortuous innominate vessels account for right paratracheal opacity, unchanged.  Hilar and mediastinal contours otherwise unremarkable.  Lungs clear. Bronchovascular markings normal.  Pulmonary vascularity normal.  No pneumothorax.  No pleural effusions.  Visualized bony thorax intact.  IMPRESSION: Stable cardiomegaly.  No acute cardiopulmonary disease.  Original Report Authenticated By: Arnell Sieving, M.D.   Ct Head Wo Contrast  12/19/2011  *RADIOLOGY REPORT*  Clinical Data: Larey Seat.  Hit head.  CT HEAD WITHOUT CONTRAST  Technique:  Contiguous axial images were obtained from the base of the skull through the vertex without contrast.  Comparison: 12/20/2010.  Findings: Stable age related cerebral atrophy, ventriculomegaly and periventricular white matter disease.  No extra-axial fluid collections are identified.  No CT findings for acute hemispheric infarction and/or intracranial hemorrhage.  The brainstem and cerebellum grossly normal and stable.  There is chronic pansinus disease.  The calvarium is intact.  The mastoid air cells and  middle ear cavities are clear.  IMPRESSION:  1.  Stable age related cerebral atrophy, ventriculomegaly and periventricular white matter disease. 2.  No acute intracranial findings. 3.  Chronic pansinusitis.  Original Report Authenticated By: P. Loralie Champagne, M.D.    ROS: General:Difficult to receive much history poor historian Skin:no rashes  CV:+ chest pain PUL:+ SOB with pain    Blood  pressure 143/78, pulse 50, temperature 97.5 F (36.4 C), temperature source Oral, resp. rate 14, height 5\' 3"  (1.6 m), weight 88.8 kg (195 lb 12.3 oz), SpO2 95.00%. PE: General:wakes, easily, denies pain currently Skin:warm and dry,brisk capillary refill HEENT:normocephalic Neck:supple no JVD Heart:S1S2 RRR no murmur Lungs:clear Abd:+ BS soft, non tender Ext:no edema + pedal pulses Neuro:alert and oriented poor historian.    Assessment/Plan Patient Active Problem List  Diagnosis  . Chest pain at rest  . CAD (coronary artery disease), cutting balloon athrectomy of her dominant AV groove of LCX.  2007  . Bradycardia  . OSA on CPAP  . DM (diabetes mellitus)  . HTN (hypertension)  . Dyslipidemia    PLAN:Agree with stopping cardizem and lopressor.  Dr. Allyson Sabal to see, ? Cath vs. Nuc. Study and hope HR will improve with holding meds.  If not then will assess for pacemaker.  INGOLD,LAURA R 12/20/2011, 4:03 PM     Agree with note written by Nada Boozer RNP  Pt well known to me. H/O CAD with remote Cath / cutting balloon atherectomy 02/2006. Other problems as outlined. I just saw her in the office several weeks ago c/o CP/SOB and palp. She was scheduled for myoview later this week but was admitted today for accelerated CP. Currently pain free. EKG without acute change Enz neg. Will start iv hep. Plan cath tomorrow to define anatomy. Increase PPI.   Runell Gess 12/20/2011 4:29 PM

## 2011-12-20 NOTE — Progress Notes (Signed)
Utilization review completed.  

## 2011-12-21 ENCOUNTER — Encounter (HOSPITAL_COMMUNITY)
Admission: EM | Disposition: A | Discharge status: Nursing Home (Includes ICF) | Payer: Self-pay | Source: Home / Self Care | Attending: Cardiovascular Disease

## 2011-12-21 ENCOUNTER — Ambulatory Visit: Payer: Medicare Other | Admitting: Internal Medicine

## 2011-12-21 DIAGNOSIS — E782 Mixed hyperlipidemia: Secondary | ICD-10-CM

## 2011-12-21 DIAGNOSIS — E1165 Type 2 diabetes mellitus with hyperglycemia: Secondary | ICD-10-CM

## 2011-12-21 DIAGNOSIS — R079 Chest pain, unspecified: Secondary | ICD-10-CM

## 2011-12-21 HISTORY — PX: LEFT HEART CATHETERIZATION WITH CORONARY ANGIOGRAM: SHX5451

## 2011-12-21 LAB — GLUCOSE, CAPILLARY: Glucose-Capillary: 100 mg/dL — ABNORMAL HIGH (ref 70–99)

## 2011-12-21 LAB — BASIC METABOLIC PANEL
Chloride: 106 mEq/L (ref 96–112)
Creatinine, Ser: 1.03 mg/dL (ref 0.50–1.10)
GFR calc Af Amer: 57 mL/min — ABNORMAL LOW (ref >90)
Potassium: 4.3 mEq/L (ref 3.5–5.1)
Sodium: 139 mEq/L (ref 135–145)

## 2011-12-21 LAB — HEPARIN LEVEL (UNFRACTIONATED): Heparin Unfractionated: 0.53 IU/mL (ref 0.30–0.70)

## 2011-12-21 LAB — PROTIME-INR
INR: 0.99 (ref 0.00–1.49)
Prothrombin Time: 13.3 seconds (ref 11.6–15.2)

## 2011-12-21 LAB — CBC
HCT: 34.9 % — ABNORMAL LOW (ref 36.0–46.0)
Hemoglobin: 11.5 g/dL — ABNORMAL LOW (ref 12.0–15.0)
MCH: 28.5 pg (ref 26.0–34.0)
MCH: 28.3 pg (ref 26.0–34.0)
MCHC: 33 g/dL (ref 30.0–36.0)
MCV: 85.7 fL (ref 78.0–100.0)
Platelets: 198 10*3/uL (ref 150–400)
RBC: 3.97 MIL/uL (ref 3.87–5.11)
RDW: 14.1 % (ref 11.5–15.5)
WBC: 8.9 10*3/uL (ref 4.0–10.5)

## 2011-12-21 SURGERY — LEFT HEART CATHETERIZATION WITH CORONARY ANGIOGRAM
Anesthesia: LOCAL

## 2011-12-21 MED ORDER — HYDRALAZINE HCL 20 MG/ML IJ SOLN
INTRAMUSCULAR | Status: AC
  Filled 2011-12-21: qty 1

## 2011-12-21 MED ORDER — HYDROCODONE-ACETAMINOPHEN 5-325 MG PO TABS
ORAL_TABLET | ORAL | Status: AC
  Filled 2011-12-21: qty 1

## 2011-12-21 MED ORDER — LIDOCAINE HCL (PF) 1 % IJ SOLN
INTRAMUSCULAR | Status: AC
  Filled 2011-12-21: qty 30

## 2011-12-21 MED ORDER — ACETAMINOPHEN 325 MG PO TABS
650.0000 mg | ORAL_TABLET | ORAL | Status: DC | PRN
  Administered 2011-12-21: 650 mg via ORAL
  Filled 2011-12-21: qty 2

## 2011-12-21 MED ORDER — MIDAZOLAM HCL 2 MG/2ML IJ SOLN
INTRAMUSCULAR | Status: AC
  Filled 2011-12-21: qty 2

## 2011-12-21 MED ORDER — SODIUM CHLORIDE 0.9 % IV SOLN: INTRAVENOUS | Status: AC

## 2011-12-21 MED ORDER — CHLORHEXIDINE GLUCONATE CLOTH 2 % EX PADS
6.0000 | MEDICATED_PAD | Freq: Every day | CUTANEOUS | Status: DC
  Administered 2011-12-22: 6 via TOPICAL

## 2011-12-21 MED ORDER — HEPARIN (PORCINE) IN NACL 2-0.9 UNIT/ML-% IJ SOLN
INTRAMUSCULAR | Status: AC
  Filled 2011-12-21: qty 2000

## 2011-12-21 MED ORDER — HYDRALAZINE HCL 20 MG/ML IJ SOLN: 10.0000 mg | INTRAMUSCULAR | Status: DC

## 2011-12-21 MED ORDER — ONDANSETRON HCL 4 MG/2ML IJ SOLN: 4.0000 mg | Freq: Four times a day (QID) | INTRAMUSCULAR | Status: DC | PRN

## 2011-12-21 MED ORDER — FENTANYL CITRATE 0.05 MG/ML IJ SOLN
INTRAMUSCULAR | Status: AC
  Filled 2011-12-21: qty 2

## 2011-12-21 MED ORDER — NITROGLYCERIN 0.2 MG/ML ON CALL CATH LAB
INTRAVENOUS | Status: AC
  Filled 2011-12-21: qty 1

## 2011-12-21 NOTE — Progress Notes (Addendum)
BRIEF PSYCHOSOCIAL ASSESSMENT 12/21/2011  Patient:  [name],[name]     Account Number:  [id number]     Admit date:  12/19/2011  Clinical Social Worker:  Juliette Mangle  Date/Time:  12/20/2011 04:00 PM  Referred by:  RN  Date Referred:  12/20/2011 Referred for  ALF Placement   Other Referral:   Patient is from [facility]   Interview type:  Patient Other interview type:    PSYCHOSOCIAL DATA Living Status:  FACILITY Admitted from facility:  [facility] Level of care:  Assisted Living Primary support name:  [name] Primary support relationship to patient:  SIBLING Degree of support available:   Fair    CURRENT CONCERNS Current Concerns  Post-Acute Placement   Other Concerns:    SOCIAL WORK ASSESSMENT / PLAN CSW met with patient at bedside. Patient is from [facility]. CSW introduced self, explained role and discussed returning to [facility]. Patient is agreeable to this plan.  Patient reported that her sister is her primary support and CSW should contact her sister. CSW will f/u with patient's sister. CSW completed FL2, which is in the shadow chart. Medications will need to be updated before discharge.   Assessment/plan status:  Psychosocial Support/Ongoing Assessment of Needs Other assessment/ plan:   Information/referral to community resources:   No referrals necessary at this time    PATIENT'S/FAMILY'S RESPONSE TO PLAN OF CARE: Patient was very receptive to information and support provided by CSW. CSW will continue to follow and assist with all d/c needs.        Sabino Niemann, MSW, [employer] [phone]

## 2011-12-21 NOTE — Progress Notes (Signed)
ANTICOAGULATION CONSULT NOTE - Follow Up Consult  Pharmacy Consult for heparin Indication: chest pain/ACS  Labs:  Basename 12/21/11 0139 12/20/11 0813 12/20/11 0807 12/19/11 2257 12/19/11 1545 12/19/11 1515 12/19/11 0839  HGB 11.3* -- 11.8* -- -- -- --  HCT 34.6* -- 35.6* -- -- 38.6 --  PLT 198 -- 218 -- -- 208 --  APTT -- -- -- -- -- -- --  LABPROT 13.3 -- -- -- -- -- --  INR 0.99 -- -- -- -- -- --  HEPARINUNFRC 0.56 -- -- -- -- -- --  CREATININE 1.03 -- 1.10 -- -- -- 1.16*  CKTOTAL -- 134 -- 146 170 -- --  CKMB -- 3.0 -- 3.3 3.9 -- --  TROPONINI -- <0.30 -- <0.30 <0.30 -- --    Assessment/Plan: 78yo female therapeutic on heparin with initial dosing for CP.  Will continue gtt at current rate and confirm stable with additional level.  Colleen Can PharmD BCPS 12/21/2011,3:26 AM

## 2011-12-21 NOTE — Progress Notes (Signed)
Patient transferred to the cardiology service, Dr.Jonathan Erlene Quan, MD, has decided to take over the patient's care

## 2011-12-21 NOTE — Progress Notes (Signed)
Inpatient Diabetes Program Recommendations  AACE/ADA: New Consensus Statement on Inpatient Glycemic Control (2009)  Target Ranges:  Prepandial:   less than 140 mg/dL      Peak postprandial:   less than 180 mg/dL (1-2 hours)      Critically ill patients:  140 - 180 mg/dL   Reason for Visit: Hypoglycemia  Results for JUDYTHE, POSTEMA (MRN [id number]) as of 12/21/2011 12:30  Ref. Range 12/20/2011 11:25 12/20/2011 16:29 12/20/2011 16:37 12/20/2011 22:30 12/21/2011 06:04 12/21/2011 10:54  Glucose-Capillary Latest Range: 70-99 mg/dL 97 39 (LL) 84 782 (H) 956 (H) 100 (H)   Recommendation:  Check HgbA1C to assess glycemic control prior to hospitalization.    Note: Will follow.

## 2011-12-21 NOTE — H&P (Signed)
     Pt was reexamined and existing H & P reviewed. No changes found.  Runell Gess, MD [facility] 12/21/2011 9:44 AM

## 2011-12-21 NOTE — Progress Notes (Signed)
Subjective:  No CP/SOB last PM  Objective:  Temp:  [97.5 F (36.4 C)-98.1 F (36.7 C)] 97.9 F (36.6 C) (06/24 2232) Pulse Rate:  [43-53] 48  (06/25 0603) Resp:  [14-20] 18  (06/25 0603) BP: (124-157)/(61-78) 133/70 mmHg (06/25 0603) SpO2:  [87 %-96 %] 96 % (06/25 0603) Weight:  [89.6 kg (197 lb 8.5 oz)] 89.6 kg (197 lb 8.5 oz) (06/25 0603) Weight change: 1.1 kg (2 lb 6.8 oz)  Intake/Output from previous day: 06/24 0701 - 06/25 0700 In: 1422.3 [P.O.:800; I.V.:622.3] Out: 1901 [Urine:1900; Stool:1]  Intake/Output from this shift:    Physical Exam: General appearance: alert, cooperative, appears stated age and no distress Neck: no adenopathy, no carotid bruit, no JVD, supple, symmetrical, trachea midline and thyroid not enlarged, symmetric, no tenderness/mass/nodules Lungs: clear to auscultation bilaterally Heart: regular rate and rhythm, S1, S2 normal, no murmur, click, rub or gallop Extremities: extremities normal, atraumatic, no cyanosis or edema Pulses: 2+ and symmetric  Lab Results: Results for orders placed during the hospital encounter of 12/19/11 (from the past 48 hour(s))  TROPONIN I     Status: Normal   Collection Time   12/19/11  8:38 AM      Component Value Range Comment   Troponin I <0.30  <0.30 ng/mL   CBC     Status: Normal   Collection Time   12/19/11  8:39 AM      Component Value Range Comment   WBC 4.4  4.0 - 10.5 K/uL    RBC 4.21  3.87 - 5.11 MIL/uL    Hemoglobin 12.0  12.0 - 15.0 g/dL    HCT 40.9  81.1 - 91.4 %    MCV 86.2  78.0 - 100.0 fL    MCH 28.5  26.0 - 34.0 pg    MCHC 33.1  30.0 - 36.0 g/dL    RDW 78.2  95.6 - 21.3 %    Platelets 186  150 - 400 K/uL   DIFFERENTIAL     Status: Normal   Collection Time   12/19/11  8:39 AM      Component Value Range Comment   Neutrophils Relative 54  43 - 77 %    Neutro Abs 2.4  1.7 - 7.7 K/uL    Lymphocytes Relative 30  12 - 46 %    Lymphs Abs 1.3  0.7 - 4.0 K/uL    Monocytes Relative 11  3 - 12 %    Monocytes Absolute 0.5  0.1 - 1.0 K/uL    Eosinophils Relative 5  0 - 5 %    Eosinophils Absolute 0.2  0.0 - 0.7 K/uL    Basophils Relative 1  0 - 1 %    Basophils Absolute 0.0  0.0 - 0.1 K/uL   BASIC METABOLIC PANEL     Status: Abnormal   Collection Time   12/19/11  8:39 AM      Component Value Range Comment   Sodium 137  135 - 145 mEq/L    Potassium 4.4  3.5 - 5.1 mEq/L    Chloride 103  96 - 112 mEq/L    CO2 24  19 - 32 mEq/L    Glucose, Bld 106 (*) 70 - 99 mg/dL    BUN 27 (*) 6 - 23 mg/dL    Creatinine, Ser 0.86 (*) 0.50 - 1.10 mg/dL    Calcium 9.7  8.4 - 57.8 mg/dL    GFR calc non Af Amer 42 (*) >90 mL/min  GFR calc Af Amer 49 (*) >90 mL/min   URINALYSIS, ROUTINE W REFLEX MICROSCOPIC     Status: Normal   Collection Time   12/19/11  8:59 AM      Component Value Range Comment   Color, Urine YELLOW  YELLOW    APPearance CLEAR  CLEAR    Specific Gravity, Urine 1.010  1.005 - 1.030    pH 7.0  5.0 - 8.0    Glucose, UA NEGATIVE  NEGATIVE mg/dL    Hgb urine dipstick NEGATIVE  NEGATIVE    Bilirubin Urine NEGATIVE  NEGATIVE    Ketones, ur NEGATIVE  NEGATIVE mg/dL    Protein, ur NEGATIVE  NEGATIVE mg/dL    Urobilinogen, UA 0.2  0.0 - 1.0 mg/dL    Nitrite NEGATIVE  NEGATIVE    Leukocytes, UA NEGATIVE  NEGATIVE MICROSCOPIC NOT DONE ON URINES WITH NEGATIVE PROTEIN, BLOOD, LEUKOCYTES, NITRITE, OR GLUCOSE <1000 mg/dL.  GLUCOSE, CAPILLARY     Status: Normal   Collection Time   12/19/11  8:59 AM      Component Value Range Comment   Glucose-Capillary 99  70 - 99 mg/dL   TROPONIN I     Status: Normal   Collection Time   12/19/11  9:50 AM      Component Value Range Comment   Troponin I <0.30  <0.30 ng/mL   CBC     Status: Normal   Collection Time   12/19/11  3:15 PM      Component Value Range Comment   WBC 6.3  4.0 - 10.5 K/uL    RBC 4.50  3.87 - 5.11 MIL/uL    Hemoglobin 12.9  12.0 - 15.0 g/dL    HCT 40.9  81.1 - 91.4 %    MCV 85.8  78.0 - 100.0 fL    MCH 28.7  26.0 - 34.0 pg     MCHC 33.4  30.0 - 36.0 g/dL    RDW 78.2  95.6 - 21.3 %    Platelets 208  150 - 400 K/uL   MAGNESIUM     Status: Normal   Collection Time   12/19/11  3:15 PM      Component Value Range Comment   Magnesium 1.8  1.5 - 2.5 mg/dL   TSH     Status: Normal   Collection Time   12/19/11  3:15 PM      Component Value Range Comment   TSH 0.738  0.350 - 4.500 uIU/mL   GLUCOSE, CAPILLARY     Status: Abnormal   Collection Time   12/19/11  3:39 PM      Component Value Range Comment   Glucose-Capillary 104 (*) 70 - 99 mg/dL   CARDIAC PANEL(CRET KIN+CKTOT+MB+TROPI)     Status: Normal   Collection Time   12/19/11  3:45 PM      Component Value Range Comment   Total CK 170  7 - 177 U/L    CK, MB 3.9  0.3 - 4.0 ng/mL    Troponin I <0.30  <0.30 ng/mL    Relative Index 2.3  0.0 - 2.5   GLUCOSE, CAPILLARY     Status: Abnormal   Collection Time   12/19/11  9:38 PM      Component Value Range Comment   Glucose-Capillary 120 (*) 70 - 99 mg/dL    Comment 1 Notify RN     CARDIAC PANEL(CRET KIN+CKTOT+MB+TROPI)     Status: Normal   Collection Time   12/19/11 10:57 PM  Component Value Range Comment   Total CK 146  7 - 177 U/L    CK, MB 3.3  0.3 - 4.0 ng/mL    Troponin I <0.30  <0.30 ng/mL    Relative Index 2.3  0.0 - 2.5   GLUCOSE, CAPILLARY     Status: Abnormal   Collection Time   12/20/11  5:49 AM      Component Value Range Comment   Glucose-Capillary 110 (*) 70 - 99 mg/dL    Comment 1 Notify RN      Comment 2 Documented in Chart     MRSA PCR SCREENING     Status: Abnormal   Collection Time   12/20/11  6:58 AM      Component Value Range Comment   MRSA by PCR POSITIVE (*) NEGATIVE   BASIC METABOLIC PANEL     Status: Abnormal   Collection Time   12/20/11  8:07 AM      Component Value Range Comment   Sodium 139  135 - 145 mEq/L    Potassium 4.6  3.5 - 5.1 mEq/L    Chloride 105  96 - 112 mEq/L    CO2 23  19 - 32 mEq/L    Glucose, Bld 118 (*) 70 - 99 mg/dL    BUN 30 (*) 6 - 23 mg/dL     Creatinine, Ser 1.47  0.50 - 1.10 mg/dL    Calcium 9.7  8.4 - 82.9 mg/dL    GFR calc non Af Amer 45 (*) >90 mL/min    GFR calc Af Amer 52 (*) >90 mL/min   CBC     Status: Abnormal   Collection Time   12/20/11  8:07 AM      Component Value Range Comment   WBC 5.7  4.0 - 10.5 K/uL    RBC 4.10  3.87 - 5.11 MIL/uL    Hemoglobin 11.8 (*) 12.0 - 15.0 g/dL    HCT 56.2 (*) 13.0 - 46.0 %    MCV 86.8  78.0 - 100.0 fL    MCH 28.8  26.0 - 34.0 pg    MCHC 33.1  30.0 - 36.0 g/dL    RDW 86.5  78.4 - 69.6 %    Platelets 218  150 - 400 K/uL   CARDIAC PANEL(CRET KIN+CKTOT+MB+TROPI)     Status: Normal   Collection Time   12/20/11  8:13 AM      Component Value Range Comment   Total CK 134  7 - 177 U/L    CK, MB 3.0  0.3 - 4.0 ng/mL    Troponin I <0.30  <0.30 ng/mL    Relative Index 2.2  0.0 - 2.5   GLUCOSE, CAPILLARY     Status: Normal   Collection Time   12/20/11 11:25 AM      Component Value Range Comment   Glucose-Capillary 97  70 - 99 mg/dL   GLUCOSE, CAPILLARY     Status: Abnormal   Collection Time   12/20/11  4:29 PM      Component Value Range Comment   Glucose-Capillary 39 (*) 70 - 99 mg/dL    Comment 1 Notify RN     GLUCOSE, CAPILLARY     Status: Normal   Collection Time   12/20/11  4:37 PM      Component Value Range Comment   Glucose-Capillary 84  70 - 99 mg/dL   GLUCOSE, CAPILLARY     Status: Abnormal   Collection Time   12/20/11  10:30 PM      Component Value Range Comment   Glucose-Capillary 121 (*) 70 - 99 mg/dL    Comment 1 Notify RN     BASIC METABOLIC PANEL     Status: Abnormal   Collection Time   12/21/11  1:39 AM      Component Value Range Comment   Sodium 139  135 - 145 mEq/L    Potassium 4.3  3.5 - 5.1 mEq/L    Chloride 106  96 - 112 mEq/L    CO2 23  19 - 32 mEq/L    Glucose, Bld 121 (*) 70 - 99 mg/dL    BUN 29 (*) 6 - 23 mg/dL    Creatinine, Ser 1.61  0.50 - 1.10 mg/dL    Calcium 9.3  8.4 - 09.6 mg/dL    GFR calc non Af Amer 49 (*) >90 mL/min    GFR calc Af Amer  57 (*) >90 mL/min   PROTIME-INR     Status: Normal   Collection Time   12/21/11  1:39 AM      Component Value Range Comment   Prothrombin Time 13.3  11.6 - 15.2 seconds    INR 0.99  0.00 - 1.49   CBC     Status: Abnormal   Collection Time   12/21/11  1:39 AM      Component Value Range Comment   WBC 8.9  4.0 - 10.5 K/uL    RBC 3.97  3.87 - 5.11 MIL/uL    Hemoglobin 11.3 (*) 12.0 - 15.0 g/dL    HCT 04.5 (*) 40.9 - 46.0 %    MCV 87.2  78.0 - 100.0 fL    MCH 28.5  26.0 - 34.0 pg    MCHC 32.7  30.0 - 36.0 g/dL    RDW 81.1  91.4 - 78.2 %    Platelets 198  150 - 400 K/uL   HEPARIN LEVEL (UNFRACTIONATED)     Status: Normal   Collection Time   12/21/11  1:39 AM      Component Value Range Comment   Heparin Unfractionated 0.56  0.30 - 0.70 IU/mL   GLUCOSE, CAPILLARY     Status: Abnormal   Collection Time   12/21/11  6:04 AM      Component Value Range Comment   Glucose-Capillary 110 (*) 70 - 99 mg/dL    Comment 1 Notify RN       Imaging: Imaging results have been reviewed  Assessment/Plan:   1. Active Problems: 2.  Chest pain at rest 3.  CAD (coronary artery disease), cutting balloon athrectomy of her dominant AV groove of LCX.  2007 4.  Bradycardia 5.  OSA on CPAP 6.  DM (diabetes mellitus) 7.  HTN (hypertension) 8.  Dyslipidemia  9.   Time Spent Directly with Patient:  20 minutes  Length of Stay:  LOS: 2 days   On IV hep. For cath today. Exam benign. Enz neg, other labs OK.  Runell Gess 12/21/2011, 8:19 AM v

## 2011-12-21 NOTE — Op Note (Signed)
[name] is a 78 y.o. female    [id number] LOCATION:  FACILITY: [facility]  PHYSICIAN: Nanetta Batty, M.D. 01/24/1927   DATE OF PROCEDURE:  12/21/2011  DATE OF DISCHARGE:  [facility]  CARDIAC CATHETERIZATION     History obtained from chart review. [name] is an 78 year old African American female with a history of CAD status post cutting balloon atherectomy of her dominant 23 groove circumflex by myself September of 2007. Her problems include hypertension, hyperlipidemia, atrial fibrillation, sleep apnea, GERD. She was recently seen in the office with complaints of chest pain was scheduled for a Myoview stress test this week. She is admitted to the hospital with chest pain on June 23 ruled out for myocardial infarction. Because of ongoing chest pain she presents now for cardiac catheterization to define her anatomy and rule out an ischemic etiology.   PROCEDURE DESCRIPTION:    The patient was brought to the second floor  [facility] cath lab in the postabsorptive state. She was  premedicated with Valium 5 mg by mouth, IV Versed and fentanyl.. Her right groin was prepped and shaved in usual sterile fashion. Xylocaine 1% was used  for local anesthesia. A 5 French sheath was inserted into the right common femoral  artery using standard Seldinger technique. 5 French right and left Judkins diagnostic catheters along with a 5 French pigtail catheter were used for selective coronary angiography and left ventriculography respectively. Visipaque dye was used for the entirety of the case. Retrograde aorta, left ventricular and pullback pressures were recorded.   HEMODYNAMICS:    AO SYSTOLIC/AO DIASTOLIC: 173/64   LV SYSTOLIC/LV DIASTOLIC: 175/12  ANGIOGRAPHIC RESULTS:   1. Left main; normal  2. LAD; normal 3. Left circumflex; dominance with the most 30-40% AV groove stenosis after the large obtuse marginal branch at the site of prior cutting balloon  arthrectomy.  4. Right coronary artery; nondominant and normal 5. Left ventriculography; RAO left ventriculogram was performed using  25 mL of Visipaque dye at 12 mL/second. The overall LVEF estimated  60 % Without wall motion abnormalities  IMPRESSION:[name] has noncritical CAD and normal LV function. I believe her chest pain is noncardiac. Empiric anti-reflex medications will be recommended. The sheath was removed and pressure was held on the groin to achieve hemostasis. The patient's labs stable condition. She'll be admitted to unit 2500, observed overnight and hydrated.  Runell Gess MD, [facility] 12/21/2011 10:14 AM

## 2011-12-22 LAB — CBC
HCT: 32.7 % — ABNORMAL LOW (ref 36.0–46.0)
Hemoglobin: 10.7 g/dL — ABNORMAL LOW (ref 12.0–15.0)
MCH: 28.3 pg (ref 26.0–34.0)
MCHC: 32.7 g/dL (ref 30.0–36.0)
MCV: 86.5 fL (ref 78.0–100.0)
Platelets: 184 10*3/uL (ref 150–400)
RBC: 3.78 MIL/uL — ABNORMAL LOW (ref 3.87–5.11)
RDW: 14.2 % (ref 11.5–15.5)
WBC: 5.9 10*3/uL (ref 4.0–10.5)

## 2011-12-22 LAB — GLUCOSE, CAPILLARY: Glucose-Capillary: 132 mg/dL — ABNORMAL HIGH (ref 70–99)

## 2011-12-22 MED ORDER — MAGNESIUM HYDROXIDE NICU ORAL SYRINGE 400 MG/5 ML: 30.0000 mL | Freq: Once | ORAL | Status: DC

## 2011-12-22 MED ORDER — ALUM & MAG HYDROXIDE-SIMETH 200-200-20 MG/5ML PO SUSP: 30.0000 mL | ORAL | Status: DC | PRN

## 2011-12-22 MED ORDER — MAGNESIUM HYDROXIDE 400 MG/5ML PO SUSP
30.0000 mL | Freq: Once | ORAL | Status: AC
  Administered 2011-12-22: 30 mL via ORAL
  Filled 2011-12-22: qty 30

## 2011-12-22 MED ORDER — ASPIRIN 81 MG PO TBEC: 81.0000 mg | DELAYED_RELEASE_TABLET | Freq: Every day | ORAL | Status: DC

## 2011-12-22 NOTE — Discharge Summary (Signed)
Of note, just prior to discharge, I was called by the nurse for a-fib.  I looked at the telemetry strips and determined it was artifact. We obtained a 12-lead EKG and it shows sinus rhythm.  I would still recommend an outpatient event monitor. Cardizem and Toprol XL held due to bradycardia in the 30's. HR in the 60's at discharge. Follow-up with Dr. Allyson Sabal.  Chrystie Nose, MD, [facility] Attending Cardiologist [facility]

## 2011-12-22 NOTE — Progress Notes (Signed)
Pt. Seen and examined. Agree with the NP/PA-C note as written.  Vitals stable. Groin intact, no bruits, hematoma and adequate distal pulses. Agree with d/c back to [facility] today. FL2 will be signed.  Chrystie Nose, MD, [facility] Attending Cardiologist [facility]

## 2011-12-22 NOTE — Progress Notes (Signed)
[facility]  Subjective: Cp resolved. She has problems with acid reflux and takes nexium at home.  Objective: Vital signs in last 24 hours: Temp:  [97.9 F (36.6 C)-98.8 F (37.1 C)] 98.7 F (37.1 C) (06/26 0832) Pulse Rate:  [55-60] 60  (06/26 0832) Resp:  [14-18] 18  (06/26 0832) BP: (98-161)/(37-67) 149/52 mmHg (06/26 0832) SpO2:  [94 %-96 %] 94 % (06/26 0832) Weight:  [86.9 kg (191 lb 9.3 oz)] 86.9 kg (191 lb 9.3 oz) (06/26 0015) Last BM Date: 12/21/11  Intake/Output from previous day: 06/25 0701 - 06/26 0700 In: 240 [P.O.:240] Out: 1150 [Urine:1150] Intake/Output this shift:    Medications Current Facility-Administered Medications  Medication Dose Route Frequency Provider Last Rate Last Dose  . 0.9 %  sodium chloride infusion   Intravenous Continuous Runell Gess, MD      . acetaminophen (TYLENOL) suppository 650 mg  650 mg Rectal Q6H PRN Richarda Overlie, MD      . acetaminophen (TYLENOL) tablet 650 mg  650 mg Oral Q4H PRN Runell Gess, MD   650 mg at 12/21/11 1433  . alum & mag hydroxide-simeth (MAALOX/MYLANTA) 200-200-20 MG/5ML suspension 30 mL  30 mL Oral Q4H PRN Runell Gess, MD      . amLODipine (NORVASC) tablet 5 mg  5 mg Oral Daily Richarda Overlie, MD   5 mg at 12/21/11 1555  . aspirin EC tablet 81 mg  81 mg Oral Daily Richarda Overlie, MD   81 mg at 12/20/11 1014  . atorvastatin (LIPITOR) tablet 10 mg  10 mg Oral q1800 Richarda Overlie, MD   10 mg at 12/21/11 1719  . Chlorhexidine Gluconate Cloth 2 % PADS 6 each  6 each Topical Q0600 Runell Gess, MD   6 each at 12/22/11 [phone]  . citalopram (CELEXA) tablet 20 mg  20 mg Oral Daily Richarda Overlie, MD   20 mg at 12/21/11 1554  . clonazePAM (KLONOPIN) tablet 0.5 mg  0.5 mg Oral BID Richarda Overlie, MD   0.5 mg at 12/21/11 2208  . cloNIDine (CATAPRES) tablet 0.2 mg  0.2 mg Oral QHS Richarda Overlie, MD   0.2 mg at 12/21/11 2210  . cloNIDine (CATAPRES) tablet 0.2 mg  0.2 mg Oral Q6H PRN Richarda Overlie, MD      . docusate sodium (COLACE) capsule 100 mg  100 mg Oral BID Richarda Overlie, MD   100 mg at 12/21/11 2208  . ezetimibe (ZETIA) tablet 10 mg  10 mg Oral QHS Richarda Overlie, MD   10 mg at 12/21/11 2207  . fenofibrate tablet 54 mg  54 mg Oral Daily Richarda Overlie, MD   54 mg at 12/21/11 1553  . fentaNYL (SUBLIMAZE) 0.05 MG/ML injection           . hydrALAZINE (APRESOLINE) 20 MG/ML injection           . hydrALAZINE (APRESOLINE) injection 10 mg  10 mg Intravenous UD Runell Gess, MD      . HYDROcodone-acetaminophen [facility]) 5-325 MG per tablet 1 tablet  1 tablet Oral BID Richarda Overlie, MD   1 tablet at 12/21/11 2208  . HYDROcodone-acetaminophen (NORCO) 5-325 MG per tablet 1-2 tablet  1-2 tablet Oral Q4H PRN Richarda Overlie, MD   1 tablet at 12/21/11 1137  . hydrocortisone (ANUSOL-HC) suppository 25 mg  25 mg Rectal BID Richarda Overlie, MD   25 mg at 12/21/11 2208  . isosorbide mononitrate (IMDUR) 24 hr tablet 30 mg  30 mg Oral BID Nada Boozer, NP   30 mg at 12/21/11 2208  . levalbuterol (XOPENEX) nebulizer solution 0.63 mg  0.63 mg Nebulization Q6H Richarda Overlie, MD   0.63 mg at 12/22/11 0738  . linagliptin (TRADJENTA) tablet 5 mg  5 mg Oral Daily Richarda Overlie, MD   5 mg at 12/21/11 1556  . losartan (COZAAR) tablet 50 mg  50 mg Oral Daily Richarda Overlie, MD   50 mg at 12/21/11 1553  . magnesium hydroxide (MILK OF MAGNESIA) suspension 30 mL  30 mL Oral Once Runell Gess, MD      . midazolam (VERSED) 2 MG/2ML injection           . mupirocin ointment (BACTROBAN) 2 % 1 application  1 application Nasal BID Richarda Overlie, MD   1 application at 12/21/11 2209  . nitroGLYCERIN (NITROLINGUAL) 0.4 MG/SPRAY spray 2 spray  2 spray Sublingual Q5 min PRN Richarda Overlie, MD      . nystatin (MYCOSTATIN/NYSTOP) topical powder   Topical TID Richarda Overlie, MD      . olopatadine (PATANOL) 0.1 % ophthalmic solution 1 drop  1 drop Both Eyes BID Richarda Overlie, MD   1 drop at 12/21/11 2207  . ondansetron (ZOFRAN) injection 4  mg  4 mg Intravenous Q6H PRN Runell Gess, MD      . ondansetron [facility]) tablet 4 mg  4 mg Oral Q6H PRN Richarda Overlie, MD      . oxybutynin (DITROPAN) tablet 5 mg  5 mg Oral Daily Richarda Overlie, MD   5 mg at 12/21/11 1553  . pantoprazole (PROTONIX) EC tablet 40 mg  40 mg Oral BID AC Nada Boozer, NP   40 mg at 12/22/11 0626  . potassium chloride (K-DUR) CR tablet 10 mEq  10 mEq Oral BID Richarda Overlie, MD   10 mEq at 12/21/11 2208  . spironolactone (ALDACTONE) tablet 25 mg  25 mg Oral Daily Richarda Overlie, MD   25 mg at 12/21/11 1553  . sucralfate (CARAFATE) tablet 1 g  1 g Oral QID Richarda Overlie, MD   1 g at 12/21/11 2208  . DISCONTD: 0.9 %  sodium chloride infusion   Intravenous Continuous Richarda Overlie, MD 50 mL/hr at 12/20/11 1854    . DISCONTD: 0.9 %  sodium chloride infusion  250 mL Intravenous PRN Nada Boozer, NP      . DISCONTD: 0.9 %  sodium chloride infusion  1 mL/kg/hr Intravenous Continuous Nada Boozer, NP 88.8 mL/hr at 12/21/11 0453 1 mL/kg/hr at 12/21/11 0453  . DISCONTD: acetaminophen (TYLENOL) tablet 650 mg  650 mg Oral Q6H PRN Richarda Overlie, MD      . DISCONTD: Chlorhexidine Gluconate Cloth 2 % PADS 6 each  6 each Topical Q0600 Richarda Overlie, MD   6 each at 12/21/11 0541  . DISCONTD: heparin ADULT infusion 100 units/mL (25000 units/250 mL)  850 Units/hr Intravenous Continuous Runell Gess, MD 8.5 mL/hr at 12/20/11 2059 850 Units/hr at 12/20/11 2059  . DISCONTD: magnesium hydroxide (MILK OF MAGNESIA) NICU oral syringe  30 mL Oral Once Runell Gess, MD      . DISCONTD: ondansetron [facility]) injection 4 mg  4 mg Intravenous Q6H PRN Richarda Overlie, MD      . DISCONTD: sodium chloride 0.9 % injection 3 mL  3 mL Intravenous Q12H Richarda Overlie, MD   3 mL at 12/20/11 2222  . DISCONTD: sodium chloride 0.9 % injection 3 mL  3 mL Intravenous Q12H Vernona Rieger  In[place], NP   3 mL at 12/20/11 2222  . DISCONTD: sodium chloride 0.9 % injection 3 mL  3 mL Intravenous PRN Nada Boozer, NP         PE: General appearance: alert, cooperative and no distress Lungs: clear to auscultation bilaterally Heart: regular rate and rhythm, S1, S2 normal, no murmur, click, rub or gallop Extremities: no LEE Pulses: 2+ and symmetric Cath site:  Mildly tender.  No hematoma, or ecchymosis.  Lab Results:   Basename 12/22/11 0415 12/21/11 0848 12/21/11 0139  WBC 5.9 7.9 8.9  HGB 10.7* 11.5* 11.3*  HCT 32.7* 34.9* 34.6*  PLT 184 218 198   BMET  Basename 12/21/11 0139 12/20/11 0807  NA 139 139  K 4.3 4.6  CL 106 105  CO2 23 23  GLUCOSE 121* 118*  BUN 29* 30*  CREATININE 1.03 1.10  CALCIUM 9.3 9.7   PT/INR  Basename 12/21/11 0139  LABPROT 13.3  INR 0.99   Cholesterol No results found for this basename: CHOL in the last 72 hours Cardiac Enzymes No components found with this basename: TROPONIN:3, CKMB:3  Studies/Results: PROCEDURE DESCRIPTION:  The patient was brought to the second floor  [place] [facility] cath lab in the postabsorptive state. She was  premedicated with Valium 5 mg by mouth, IV Versed and fentanyl.. Her right groin  was prepped and shaved in usual sterile fashion. Xylocaine 1% was used  for local anesthesia. A 5 French sheath was inserted into the right common femoral  artery using standard Seldinger technique. 5 French right and left Judkins diagnostic catheters along with a 5 French pigtail catheter were used for selective coronary angiography and left ventriculography respectively. Visipaque dye was used for the entirety of the case. Retrograde aorta, left ventricular and pullback pressures were recorded.  HEMODYNAMICS:  AO SYSTOLIC/AO DIASTOLIC: 173/64  LV SYSTOLIC/LV DIASTOLIC: 175/12  ANGIOGRAPHIC RESULTS:  1. Left main; normal  2. LAD; normal  3. Left circumflex; dominance with the most 30-40% AV groove stenosis after the large obtuse marginal branch at the site of prior cutting balloon arthrectomy.  4. Right coronary artery; nondominant and normal   5. Left ventriculography; RAO left ventriculogram was performed using  25 mL of Visipaque dye at 12 mL/second. The overall LVEF estimated  60 % Without wall motion abnormalities  IMPRESSION:Ms. Mantey has noncritical CAD and normal LV function. I believe her chest pain is noncardiac. Empiric anti-reflex medications will be recommended. The sheath was removed and pressure was held on the groin to achieve hemostasis. The patient's labs stable condition. She'll be admitted to unit 2500, observed overnight and hydrated.  Runell Gess MD, [facility]  12/21/2011  10:14 AM  Assessment/Plan  Active Problems:  Chest pain at rest  CAD (coronary artery disease), cutting balloon athrectomy of her dominant AV groove of LCX.  2007  Bradycardia  OSA on CPAP  DM (diabetes mellitus)  HTN (hypertension)  Dyslipidemia   Plan:  SP left heart cath revealing Noncritical CAD and normal LV function.  Groin looks good.  DC back to assisted living([facility]).  FL2 needs signature.   LOS: 3 days    Mario Voong 12/22/2011 9:39 AM

## 2011-12-22 NOTE — Discharge Summary (Signed)
Physician Discharge Summary  Patient ID: [name] MRN: [id number] DOB/AGE: [birth year] 78 y.o.  Admit date: 12/19/2011 Discharge date: 12/22/2011  Admission Diagnoses:  Discharge Diagnoses:  Active Problems:  Chest pain at rest  CAD (coronary artery disease), cutting balloon athrectomy of her dominant AV groove of LCX.  2007  Bradycardia  OSA on CPAP  DM (diabetes mellitus)  HTN (hypertension)  Dyslipidemia    Discharged Condition: stable  Hospital Course:   78 year old lady with a history of CAD with cutting balloon atherectomy of her dominant AV groove LCX, 02/2006.She was seen by Dr. Allyson Sabal 12/09/11 and was concerned about palpitations and pain and she was scheduled for both an event monitor and Lexiscan myoview on 12/23/11.  She presented with complaint of left anterior chest pain. She said it has been going on for 3 months, but is worse the day of admission. She felt somewhat dizzy also.  Patient brought in from an Assisted Living Facility by EMS. She reported that she began feeling dizzy while walking. She stated that she fell. She was ambulatory after the fall.  She denied any pain in her neck.  Pain worse with palpation. She denied any nausea, vomiting, or diaphoresis. No numbness or tingling. She denies any fever chills rigors or cough. She denied any  melena hematochezia, dysarthria slurred speech or unilateral weakness.  She localizes her pain to the left lateral chest.  Negative MI, But HR is down to 30's at times. EKG on admit SB at 50 with no acute changes.   Metoprolol and cardizem were d/c'd.  She was admitted to rule out ACS.  Cardiac markers were negative.  No acute EKG findings other than bradycardia which has improved after stopping metoprolol and cardizem.  IV heparin started.  She was taken to the cath lab for a left heart cath which revealed noncritical CAD and normal LV function.(See note below).  She was seen by Dr. Rennis Golden and is stable for DC back to [facility].   She will follow up with [facility] within two weeks.  Outpatient follow up:  Heart rate, BP, right groin  Consults: None  Significant Diagnostic Studies:  PROCEDURE DESCRIPTION:  The patient was brought to the second floor  [place] [facility]iac cath lab in the postabsorptive state. She was  premedicated with Valium 5 mg by mouth, IV Versed and fentanyl.. Her right groin  was prepped and shaved in usual sterile fashion. Xylocaine 1% was used  for local anesthesia. A 5 French sheath was inserted into the right common femoral  artery using standard Seldinger technique. 5 French right and left Judkins diagnostic catheters along with a 5 French pigtail catheter were used for selective coronary angiography and left ventriculography respectively. Visipaque dye was used for the entirety of the case. Retrograde aorta, left ventricular and pullback pressures were recorded.  HEMODYNAMICS:  AO SYSTOLIC/AO DIASTOLIC: 173/64  LV SYSTOLIC/LV DIASTOLIC: 175/12  ANGIOGRAPHIC RESULTS:  1. Left main; normal  2. LAD; normal  3. Left circumflex; dominance with the most 30-40% AV groove stenosis after the large obtuse marginal branch at the site of prior cutting balloon arthrectomy.  4. Right coronary artery; nondominant and normal  5. Left ventriculography; RAO left ventriculogram was performed using  25 mL of Visipaque dye at 12 mL/second. The overall LVEF estimated  60 % Without wall motion abnormalities  IMPRESSION:Ms. Nudelman has noncritical CAD and normal LV function. I believe her chest pain is noncardiac. Empiric anti-reflex medications will be recommended. The sheath  was removed and pressure was held on the groin to achieve hemostasis. The patient's labs stable condition. She'll be admitted to unit 2500, observed overnight and hydrated.  Runell Gess MD, [facility]  12/21/2011  10:14 AM   Lipid Panel     Component Value Date/Time   CHOL  Value: 126        ATP III CLASSIFICATION:  <200     mg/dL    Desirable  829-562  mg/dL   Borderline High  >=130    mg/dL   High 02/01/5783 6962   TRIG 110 11/29/2006 0025   HDL 40 11/29/2006 0025   CHOLHDL 3.2 11/29/2006 0025   VLDL 22 11/29/2006 0025   LDLCALC  Value: 64        Total Cholesterol/HDL:CHD Risk Coronary Heart Disease Risk Table                     Men   Women  1/2 Average Risk   3.4   3.3 11/29/2006 0025   CBC    Component Value Date/Time   WBC 5.9 12/22/2011 0415   RBC 3.78* 12/22/2011 0415   HGB 10.7* 12/22/2011 0415   HCT 32.7* 12/22/2011 0415   PLT 184 12/22/2011 0415   MCV 86.5 12/22/2011 0415   MCH 28.3 12/22/2011 0415   MCHC 32.7 12/22/2011 0415   RDW 14.2 12/22/2011 0415   LYMPHSABS 1.3 12/19/2011 0839   MONOABS 0.5 12/19/2011 0839   EOSABS 0.2 12/19/2011 0839   BASOSABS 0.0 12/19/2011 0839   BMET    Component Value Date/Time   NA 139 12/21/2011 0139   K 4.3 12/21/2011 0139   CL 106 12/21/2011 0139   CO2 23 12/21/2011 0139   GLUCOSE 121* 12/21/2011 0139   BUN 29* 12/21/2011 0139   CREATININE 1.03 12/21/2011 0139   CALCIUM 9.3 12/21/2011 0139   GFRNONAA 49* 12/21/2011 0139   GFRAA 57* 12/21/2011 0139   Cardiac Panel (last 3 results)  Basename 12/20/11 0813 12/19/11 2257 12/19/11 1545  CKTOTAL 134 146 170  CKMB 3.0 3.3 3.9  TROPONINI <0.30 <0.30 <0.30  RELINDX 2.2 2.3 2.3      Treatments: Protonix, IV heparin. DC'd metoprolol and cardizem  Discharge Exam: Blood pressure 149/52, pulse 60, temperature 98.7 F (37.1 C), temperature source Oral, resp. rate 18, height 5\' 3"  (1.6 m), weight 86.9 kg (191 lb 9.3 oz), SpO2 94.00%.   Disposition: 01-Home or Self Care  Discharge Orders    Future Appointments: Provider: Department: Dept Phone: Center:   01/13/2012 9:00 AM Iva Boop, MD [facility] [place] Office [phone] [facility]     Future Orders Please Complete By Expires   Diet - low sodium heart healthy      Increase activity slowly        Medication List  As of 12/22/2011 11:24 AM   STOP taking these medications          diltiazem 120 MG tablet      metoprolol succinate 50 MG 24 hr tablet         TAKE these medications         acetaminophen 325 MG tablet   Commonly known as: TYLENOL   Take 650 mg by mouth 3 (three) times daily.      amLODipine 5 MG tablet   Commonly known as: NORVASC   Take 5 mg by mouth daily.      aspirin 81 MG EC tablet   Take 1 tablet (81 mg total) by  mouth daily.      calcium-vitamin D 500-200 MG-UNIT per tablet   Commonly known as: OSCAL WITH D   Take 1 tablet by mouth daily.      cetirizine 10 MG tablet   Commonly known as: ZYRTEC   Take 10 mg by mouth daily.      cholecalciferol 1000 UNITS tablet   Commonly known as: VITAMIN D   Take 1,000 Units by mouth daily.      citalopram 20 MG tablet   Commonly known as: CELEXA   Take 20 mg by mouth daily.      clonazePAM 1 MG tablet   Commonly known as: KLONOPIN   Take 0.5 mg by mouth daily as needed. For anxiety      cloNIDine 0.2 MG tablet   Commonly known as: CATAPRES   Take 0.2 mg by mouth every 6 (six) hours as needed. For elevated blood pressure if SBP is >180 or DBP >100.      cloNIDine 0.2 MG tablet   Commonly known as: CATAPRES   Take 0.2 mg by mouth at bedtime.      diclofenac sodium 1 % Gel   Commonly known as: VOLTAREN   Apply 1 application topically every 6 (six) hours as needed. Apply 4 grams to affected area. May keep in room.      docusate sodium 100 MG capsule   Commonly known as: COLACE   Take 100 mg by mouth 2 (two) times daily.      esomeprazole 40 MG capsule   Commonly known as: NEXIUM   Take 40 mg by mouth daily before breakfast.      ezetimibe 10 MG tablet   Commonly known as: ZETIA   Take 10 mg by mouth at bedtime.      fenofibrate 48 MG tablet   Commonly known as: TRICOR   Take 48 mg by mouth at bedtime.      fluticasone 50 MCG/ACT nasal spray   Commonly known as: FLONASE   Place 2 sprays into the nose daily.      HEMORRHOIDAL-HC 25 MG suppository   Generic drug:  hydrocortisone   Place 25 mg rectally 2 (two) times daily.      HYDROcodone-acetaminophen 5-325 MG per tablet   Commonly known as: NORCO   Take 1 tablet by mouth 2 (two) times daily. For pain      isosorbide mononitrate 30 MG 24 hr tablet   Commonly known as: IMDUR   Take 30 mg by mouth daily.      ketorolac 0.4 % Soln   Commonly known as: ACULAR   Place 1 drop into the right eye 2 (two) times daily.      losartan 50 MG tablet   Commonly known as: COZAAR   Take 50 mg by mouth daily.      metoCLOPramide 5 MG tablet   Commonly known as: REGLAN   Take 5 mg by mouth 4 (four) times daily -  before meals and at bedtime.      nitroGLYCERIN 0.4 MG/SPRAY spray   Commonly known as: NITROLINGUAL   Place 2 sprays under the tongue every 5 (five) minutes as needed. Use 2 sprays under tongue as needed for chest pain. Repeat 2 sprays if pain persists after 15 minutes if pain still persists      nystatin cream   Commonly known as: MYCOSTATIN   Apply 1 application topically 3 (three) times daily. Apply under both breasts.  nystatin 100000 UNIT/GM Powd   Apply topically 3 (three) times daily. Apply under breast bilaterally after applying Nystatin cream for yeast rash.  Once rash has resolved, continue powder twice daily.      oxybutynin 5 MG tablet   Commonly known as: DITROPAN   Take 5 mg by mouth daily.      PATADAY 0.2 % Soln   Generic drug: Olopatadine HCl   Place 1 drop into both eyes daily.      polyethylene glycol packet   Commonly known as: MIRALAX / GLYCOLAX   Take 17 g by mouth as directed. Mix 1 capful in 8oz of juice/water and drink 3 times each week      potassium chloride 10 MEQ tablet   Commonly known as: K-DUR   Take 10 mEq by mouth 2 (two) times daily. Do not crush      rosuvastatin 5 MG tablet   Commonly known as: CRESTOR   Take 5 mg by mouth at bedtime.      sitaGLIPtin 50 MG tablet   Commonly known as: JANUVIA   Take 50 mg by mouth daily.       spironolactone 25 MG tablet   Commonly known as: ALDACTONE   Take 25 mg by mouth daily.      sucralfate 1 G tablet   Commonly known as: CARAFATE   Take 1 g by mouth 4 (four) times daily.             SignedWilburt Finlay 12/22/2011, 11:24 AM

## 2011-12-29 ENCOUNTER — Emergency Department (HOSPITAL_COMMUNITY): Payer: Medicare Other

## 2011-12-29 ENCOUNTER — Emergency Department (HOSPITAL_COMMUNITY)
Admission: EM | Admit: 2011-12-29 | Discharge: 2011-12-29 | Disposition: A | Discharge status: Skilled Nursing Facility | Payer: Medicare Other | Attending: Emergency Medicine | Admitting: Emergency Medicine

## 2011-12-29 ENCOUNTER — Encounter (HOSPITAL_COMMUNITY): Payer: Self-pay | Admitting: *Deleted

## 2011-12-29 DIAGNOSIS — I10 Essential (primary) hypertension: Secondary | ICD-10-CM | POA: Insufficient documentation

## 2011-12-29 DIAGNOSIS — I4891 Unspecified atrial fibrillation: Secondary | ICD-10-CM | POA: Insufficient documentation

## 2011-12-29 DIAGNOSIS — K219 Gastro-esophageal reflux disease without esophagitis: Secondary | ICD-10-CM | POA: Insufficient documentation

## 2011-12-29 DIAGNOSIS — R209 Unspecified disturbances of skin sensation: Secondary | ICD-10-CM | POA: Insufficient documentation

## 2011-12-29 DIAGNOSIS — I252 Old myocardial infarction: Secondary | ICD-10-CM | POA: Insufficient documentation

## 2011-12-29 DIAGNOSIS — M2550 Pain in unspecified joint: Secondary | ICD-10-CM | POA: Insufficient documentation

## 2011-12-29 DIAGNOSIS — Z9089 Acquired absence of other organs: Secondary | ICD-10-CM | POA: Insufficient documentation

## 2011-12-29 DIAGNOSIS — F0390 Unspecified dementia without behavioral disturbance: Secondary | ICD-10-CM | POA: Insufficient documentation

## 2011-12-29 DIAGNOSIS — R0602 Shortness of breath: Secondary | ICD-10-CM | POA: Insufficient documentation

## 2011-12-29 DIAGNOSIS — Z7982 Long term (current) use of aspirin: Secondary | ICD-10-CM | POA: Insufficient documentation

## 2011-12-29 DIAGNOSIS — Z79899 Other long term (current) drug therapy: Secondary | ICD-10-CM | POA: Insufficient documentation

## 2011-12-29 DIAGNOSIS — I2510 Atherosclerotic heart disease of native coronary artery without angina pectoris: Secondary | ICD-10-CM | POA: Insufficient documentation

## 2011-12-29 DIAGNOSIS — R079 Chest pain, unspecified: Secondary | ICD-10-CM | POA: Insufficient documentation

## 2011-12-29 LAB — TROPONIN I: Troponin I: <0.3 ng/mL (ref <0.30)

## 2011-12-29 LAB — BASIC METABOLIC PANEL
Calcium: 10.6 mg/dL — ABNORMAL HIGH (ref 8.4–10.5)
GFR calc Af Amer: 57 mL/min — ABNORMAL LOW (ref >90)
GFR calc non Af Amer: 49 mL/min — ABNORMAL LOW (ref >90)
Glucose, Bld: 111 mg/dL — ABNORMAL HIGH (ref 70–99)
Sodium: 134 mEq/L — ABNORMAL LOW (ref 135–145)

## 2011-12-29 LAB — CBC WITH DIFFERENTIAL/PLATELET
Basophils Absolute: 0 10*3/uL (ref 0.0–0.1)
Basophils Relative: 0 % (ref 0–1)
Eosinophils Absolute: 0.1 10*3/uL (ref 0.0–0.7)
MCH: 28.6 pg (ref 26.0–34.0)
MCHC: 33.2 g/dL (ref 30.0–36.0)
Neutro Abs: 4 10*3/uL (ref 1.7–7.7)
Neutrophils Relative %: 62 % (ref 43–77)
Platelets: 256 10*3/uL (ref 150–400)

## 2011-12-29 LAB — URINALYSIS, ROUTINE W REFLEX MICROSCOPIC
Leukocytes, UA: NEGATIVE
Nitrite: NEGATIVE
Protein, ur: NEGATIVE mg/dL
Specific Gravity, Urine: 1.01 (ref 1.005–1.030)
Urobilinogen, UA: 0.2 mg/dL (ref 0.0–1.0)

## 2011-12-29 MED ORDER — OXYCODONE-ACETAMINOPHEN 5-325 MG PO TABS
1.0000 | ORAL_TABLET | Freq: Once | ORAL | Status: AC
  Administered 2011-12-29: 1 via ORAL
  Filled 2011-12-29: qty 1

## 2011-12-29 MED ORDER — CLONIDINE HCL 0.1 MG PO TABS
0.2000 mg | ORAL_TABLET | Freq: Once | ORAL | Status: AC
  Administered 2011-12-29: 0.2 mg via ORAL
  Filled 2011-12-29: qty 2

## 2011-12-29 MED ORDER — OXYCODONE-ACETAMINOPHEN 5-325 MG PO TABS: 1.0000 | ORAL_TABLET | Freq: Four times a day (QID) | ORAL | Status: AC | PRN

## 2011-12-29 NOTE — ED Provider Notes (Signed)
History     CSN: [id number]  Arrival date & time 12/29/11  1406   First MD Initiated Contact with Patient 12/29/11 1508      Chief Complaint  Patient presents with  . Joint Pain    (Consider location/radiation/quality/duration/timing/severity/associated sxs/prior treatment) The history is provided by the patient.   patient presents with multiple complaints. She states she burning in her arms and legs since earlier today. She states it feels like her arthritis. No relief with her Vicodin. She's also complaining of urinary burning. She thinks she has a urinary tract infection. No fevers. She has had some nausea and a decreased appetite. The bony. It is mild diffuse abdominal pain. The diarrhea. She's also had some chest pain. She states she has GERD. She states this feels like her GERD.  Past Medical History  Diagnosis Date  . Atrial fibrillation   . Coronary artery disease   . Dementia   . Hypertension   . Hyperlipidemia   . Anxiety   . GERD (gastroesophageal reflux disease)   . CHF (congestive heart failure)   . Polyp, stomach 11/11/2006  . Aphakia of left eye   . Obese   . MI (myocardial infarction)   . Sleep apnea   . Chest pain at rest 12/20/2011  . CAD (coronary artery disease), cutting balloon athrectomy of her dominant AV groove of LCX.  2007 12/20/2011  . Bradycardia 12/20/2011  . OSA on CPAP 12/20/2011  . HTN (hypertension) 12/20/2011  . Dyslipidemia  12/20/2011    Past Surgical History  Procedure Date  . Immature cataract     left eye  . Back surgery   . Appendectomy   . Breast surgery   . Fracture surgery   . Dilation and curettage of uterus   . Abdominal hysterectomy   . Coronary angioplasty     Family History  Problem Relation Age of Onset  . Colon cancer      History  Substance Use Topics  . Smoking status: Never Smoker   . Smokeless tobacco: Never Used  . Alcohol Use: No    OB History    Grav Para Term Preterm Abortions TAB SAB Ect Mult Living                    Review of Systems  Constitutional: Positive for fatigue. Negative for activity change and appetite change.  HENT: Negative for neck stiffness.   Eyes: Negative for pain.  Respiratory: Negative for chest tightness and shortness of breath.   Cardiovascular: Positive for chest pain. Negative for leg swelling.  Gastrointestinal: Positive for nausea and abdominal pain. Negative for vomiting and diarrhea.  Genitourinary: Positive for dysuria. Negative for flank pain.  Musculoskeletal: Positive for arthralgias. Negative for back pain and gait problem.  Skin: Negative for rash.  Neurological: Negative for weakness, numbness and headaches.  Psychiatric/Behavioral: Negative for behavioral problems.    Allergies  Chicken allergy and Fish allergy  Home Medications   Current Outpatient Rx  Name Route Sig Dispense Refill  . ACETAMINOPHEN 325 MG PO TABS Oral Take 650 mg by mouth 3 (three) times daily.    Marland Kitchen AMLODIPINE BESYLATE 5 MG PO TABS Oral Take 5 mg by mouth daily.    . ASPIRIN 81 MG PO TBEC Oral Take 1 tablet (81 mg total) by mouth daily.    Marland Kitchen CALCIUM CARBONATE-VITAMIN D 500-200 MG-UNIT PO TABS Oral Take 1 tablet by mouth daily.    Marland Kitchen CETIRIZINE HCL 10 MG PO  TABS Oral Take 10 mg by mouth daily.    Marland Kitchen VITAMIN D 1000 UNITS PO TABS Oral Take 1,000 Units by mouth daily.    Marland Kitchen CITALOPRAM HYDROBROMIDE 20 MG PO TABS Oral Take 20 mg by mouth daily.    Marland Kitchen CLONAZEPAM 1 MG PO TABS Oral Take 0.5 mg by mouth daily as needed. For anxiety    . CLONIDINE HCL 0.2 MG PO TABS Oral Take 0.2 mg by mouth every 6 (six) hours as needed. For elevated blood pressure if SBP is >180 or DBP >100.    Marland Kitchen CLONIDINE HCL 0.2 MG PO TABS Oral Take 0.2 mg by mouth at bedtime.    Marland Kitchen DICLOFENAC SODIUM 1 % TD GEL Topical Apply 1 application topically every 6 (six) hours as needed. Apply 4 grams to affected area. May keep in room.    . DOCUSATE SODIUM 100 MG PO CAPS Oral Take 100 mg by mouth 2 (two) times daily.    Marland Kitchen  ESOMEPRAZOLE MAGNESIUM 40 MG PO CPDR Oral Take 40 mg by mouth daily before breakfast.    . EZETIMIBE 10 MG PO TABS Oral Take 10 mg by mouth at bedtime.    . FENOFIBRATE 48 MG PO TABS Oral Take 48 mg by mouth at bedtime.    Marland Kitchen FLUTICASONE PROPIONATE 50 MCG/ACT NA SUSP Nasal Place 2 sprays into the nose daily.    Marland Kitchen HYDROCORTISONE ACETATE 25 MG RE SUPP Rectal Place 25 mg rectally 2 (two) times daily.    . ISOSORBIDE MONONITRATE ER 30 MG PO TB24 Oral Take 30 mg by mouth daily.    Marland Kitchen KETOROLAC TROMETHAMINE 0.4 % OP SOLN Right Eye Place 1 drop into the right eye 2 (two) times daily.    Marland Kitchen LOSARTAN POTASSIUM 50 MG PO TABS Oral Take 50 mg by mouth daily.    Marland Kitchen METOCLOPRAMIDE HCL 5 MG PO TABS Oral Take 5 mg by mouth 4 (four) times daily -  before meals and at bedtime.     Marland Kitchen NITROGLYCERIN 0.4 MG/SPRAY TL SOLN Sublingual Place 2 sprays under the tongue every 5 (five) minutes as needed. Use 2 sprays under tongue as needed for chest pain. Repeat 2 sprays if pain persists after 15 minutes if pain still persists    . NYSTATIN 100000 UNIT/GM EX POWD Topical Apply topically 3 (three) times daily. Apply under breast bilaterally after applying Nystatin cream for yeast rash.  Once rash has resolved, continue powder twice daily.    . NYSTATIN 100000 UNIT/GM EX CREA Topical Apply 1 application topically 3 (three) times daily. Apply under both breasts.    . OLOPATADINE HCL 0.2 % OP SOLN Both Eyes Place 1 drop into both eyes daily.    . OXYBUTYNIN CHLORIDE 5 MG PO TABS Oral Take 5 mg by mouth daily.    Marland Kitchen POLYETHYLENE GLYCOL 3350 PO PACK Oral Take 17 g by mouth as directed. Mix 1 capful in 8oz of juice/water and drink 3 times each week    . POTASSIUM CHLORIDE ER 10 MEQ PO TBCR Oral Take 10 mEq by mouth 2 (two) times daily. Do not crush    . ROSUVASTATIN CALCIUM 5 MG PO TABS Oral Take 5 mg by mouth at bedtime.     Marland Kitchen SITAGLIPTIN PHOSPHATE 50 MG PO TABS Oral Take 50 mg by mouth daily.    Marland Kitchen SPIRONOLACTONE 25 MG PO TABS Oral Take  25 mg by mouth daily.    . SUCRALFATE 1 G PO TABS Oral Take 1 g  by mouth 4 (four) times daily.    . OXYCODONE-ACETAMINOPHEN 5-325 MG PO TABS Oral Take 1-2 tablets by mouth every 6 (six) hours as needed for pain. 20 tablet 0    BP 228/60  Pulse 65  Temp 99.4 F (37.4 C) (Oral)  Resp 16  SpO2 95%  Physical Exam  Nursing note and vitals reviewed. Constitutional: She is oriented to person, place, and time. She appears well-developed and well-nourished.  HENT:  Head: Normocephalic and atraumatic.  Eyes: EOM are normal. Pupils are equal, round, and reactive to light.  Neck: Normal range of motion. Neck supple.  Cardiovascular: Normal rate, regular rhythm and normal heart sounds.   No murmur heard. Pulmonary/Chest: Effort normal and breath sounds normal. No respiratory distress. She has no wheezes. She has no rales.  Abdominal: Soft. Bowel sounds are normal. She exhibits no distension. There is tenderness. There is no rebound and no guarding.       Minimal diffuse tenderness without rebound guarding. No hernias palpated  Musculoskeletal: Normal range of motion.  Neurological: She is alert and oriented to person, place, and time. No cranial nerve deficit.  Skin: Skin is warm and dry.  Psychiatric: She has a normal mood and affect. Her speech is normal.    ED Course  Procedures (including critical care time)  Labs Reviewed  BASIC METABOLIC PANEL - Abnormal; Notable for the following:    Sodium 134 (*)     Glucose, Bld 111 (*)     Calcium 10.6 (*)     GFR calc non Af Amer 49 (*)     GFR calc Af Amer 57 (*)     All other components within normal limits  CBC WITH DIFFERENTIAL  URINALYSIS, ROUTINE W REFLEX MICROSCOPIC  TROPONIN I   Dg Chest 2 View  12/29/2011  *RADIOLOGY REPORT*  Clinical Data: Shortness of breath, chest pain  CHEST - 2 VIEW  Comparison: Chest x-ray of 12/19/2011  Findings: The lungs are clear.  Mild cardiomegaly is stable.  No acute bony abnormality is seen.   IMPRESSION: Stable mild cardiomegaly.  No active lung disease.  Original Report Authenticated By: Juline Patch, M.D.     1. Arthralgia     Date: 12/29/2011  Rate: 67  Rhythm: normal sinus rhythm  QRS Axis: left  Intervals: normal  ST/T Wave abnormalities: normal  Conduction Disutrbances:lvh  Narrative Interpretation:   Old EKG Reviewed: unchanged     MDM  Patient presented with multiple complaints. She states she has burning in her arms and legs since this morning. She states it feels like her arthritis. She states she also has stiffness in her legs at times. She states showed dysuria. She states to abdominal pain. She states she has headache. She states she has chest pain. Overall laboratory reassuring. Patient's pain medicine has been increased and she'll be discharged home to follow with her Dr. I doubt focal neurologic deficit. She recently has had a stable heart catheterization.        Juliet Rude. Rubin Payor, MD 12/29/11 1943

## 2011-12-29 NOTE — ED Notes (Signed)
Pt in from [facility] by ems. C/o burning in arms and legs since this am. Hx arthritis. HTN, with Hx of same.

## 2012-01-11 ENCOUNTER — Encounter (HOSPITAL_COMMUNITY): Payer: Self-pay | Admitting: *Deleted

## 2012-01-11 ENCOUNTER — Emergency Department (HOSPITAL_COMMUNITY)
Admission: EM | Admit: 2012-01-11 | Discharge: 2012-01-11 | Disposition: A | Discharge status: Home / Self Care | Payer: Medicare Other | Attending: Emergency Medicine | Admitting: Emergency Medicine

## 2012-01-11 ENCOUNTER — Emergency Department (HOSPITAL_COMMUNITY): Payer: Medicare Other

## 2012-01-11 DIAGNOSIS — R0602 Shortness of breath: Secondary | ICD-10-CM | POA: Insufficient documentation

## 2012-01-11 DIAGNOSIS — R10819 Abdominal tenderness, unspecified site: Secondary | ICD-10-CM | POA: Insufficient documentation

## 2012-01-11 DIAGNOSIS — I2510 Atherosclerotic heart disease of native coronary artery without angina pectoris: Secondary | ICD-10-CM | POA: Insufficient documentation

## 2012-01-11 DIAGNOSIS — R079 Chest pain, unspecified: Secondary | ICD-10-CM

## 2012-01-11 DIAGNOSIS — Z79899 Other long term (current) drug therapy: Secondary | ICD-10-CM | POA: Insufficient documentation

## 2012-01-11 DIAGNOSIS — R1031 Right lower quadrant pain: Secondary | ICD-10-CM | POA: Insufficient documentation

## 2012-01-11 DIAGNOSIS — I252 Old myocardial infarction: Secondary | ICD-10-CM | POA: Insufficient documentation

## 2012-01-11 DIAGNOSIS — E119 Type 2 diabetes mellitus without complications: Secondary | ICD-10-CM | POA: Insufficient documentation

## 2012-01-11 DIAGNOSIS — I10 Essential (primary) hypertension: Secondary | ICD-10-CM | POA: Insufficient documentation

## 2012-01-11 LAB — PROTIME-INR: INR: 1.15 (ref 0.00–1.49)

## 2012-01-11 LAB — CBC WITH DIFFERENTIAL/PLATELET
Basophils Absolute: 0 10*3/uL (ref 0.0–0.1)
Eosinophils Relative: 1 % (ref 0–5)
Lymphocytes Relative: 23 % (ref 12–46)
MCV: 86 fL (ref 78.0–100.0)
Neutrophils Relative %: 68 % (ref 43–77)
Platelets: 231 10*3/uL (ref 150–400)
RDW: 13.6 % (ref 11.5–15.5)
WBC: 7.6 10*3/uL (ref 4.0–10.5)

## 2012-01-11 LAB — URINALYSIS, ROUTINE W REFLEX MICROSCOPIC
Ketones, ur: NEGATIVE mg/dL
Nitrite: NEGATIVE
Protein, ur: 30 mg/dL — AB
Urobilinogen, UA: 0.2 mg/dL (ref 0.0–1.0)

## 2012-01-11 LAB — COMPREHENSIVE METABOLIC PANEL
ALT: 18 U/L (ref 0–35)
AST: 19 U/L (ref 0–37)
CO2: 23 mEq/L (ref 19–32)
Calcium: 10.3 mg/dL (ref 8.4–10.5)
GFR calc non Af Amer: 60 mL/min — ABNORMAL LOW (ref >90)
Sodium: 138 mEq/L (ref 135–145)
Total Protein: 8.5 g/dL — ABNORMAL HIGH (ref 6.0–8.3)

## 2012-01-11 LAB — CARDIAC PANEL(CRET KIN+CKTOT+MB+TROPI)
CK, MB: 4.8 ng/mL — ABNORMAL HIGH (ref 0.3–4.0)
Relative Index: 1.9 (ref 0.0–2.5)
Relative Index: 2 (ref 0.0–2.5)
Total CK: 241 U/L — ABNORMAL HIGH (ref 7–177)
Troponin I: <0.3 ng/mL (ref <0.30)
Troponin I: <0.3 ng/mL (ref <0.30)

## 2012-01-11 LAB — URINE MICROSCOPIC-ADD ON

## 2012-01-11 MED ORDER — OXYCODONE-ACETAMINOPHEN 5-325 MG PO TABS
1.0000 | ORAL_TABLET | Freq: Once | ORAL | Status: AC
  Administered 2012-01-11: 1 via ORAL
  Filled 2012-01-11: qty 1

## 2012-01-11 MED ORDER — IOHEXOL 300 MG/ML  SOLN
20.0000 mL | INTRAMUSCULAR | Status: AC
  Administered 2012-01-11 (×2): 20 mL via ORAL

## 2012-01-11 MED ORDER — CLONIDINE HCL 0.2 MG PO TABS
0.2000 mg | ORAL_TABLET | Freq: Once | ORAL | Status: AC
  Administered 2012-01-11: 0.2 mg via ORAL
  Filled 2012-01-11: qty 1

## 2012-01-11 MED ORDER — AMLODIPINE BESYLATE 5 MG PO TABS
5.0000 mg | ORAL_TABLET | ORAL | Status: AC
  Administered 2012-01-11: 5 mg via ORAL
  Filled 2012-01-11: qty 1

## 2012-01-11 MED ORDER — IOHEXOL 300 MG/ML  SOLN
100.0000 mL | Freq: Once | INTRAMUSCULAR | Status: AC | PRN
  Administered 2012-01-11: 100 mL via INTRAVENOUS

## 2012-01-11 NOTE — ED Notes (Signed)
Discharged back to nursing facility via [facility].  [facility] called and awaiting transport back.

## 2012-01-11 NOTE — ED Notes (Signed)
Report to [facility]- patient will await transport there.

## 2012-01-11 NOTE — ED Notes (Addendum)
Patient resides at [facility].  Staff states "chest pain started sometime last pm around the left breast, no radiation of pain, with shortness of breath, no N/V. Patient has had elevated BP's.  Had 2 NTG, 4 Baby ASA with no relief.  Arrived with telemetry monitor intact by Dr. Allyson Sabal.

## 2012-01-11 NOTE — ED Provider Notes (Signed)
History     CSN: [id number]  Arrival date & time 01/11/12  4098   First MD Initiated Contact with Patient 01/11/12 0827      Chief Complaint  Patient presents with  . Chest Pain    (Consider location/radiation/quality/duration/timing/severity/associated sxs/prior treatment) HPI Comments: Patient presents from nursing home with chest pain it started last night and recurred again this morning. Left-sided underneath her left breast and did not radiate. It is associated with shortness of breath. She had no nausea or vomiting. She complains of diffuse abdominal pain as well as pain in her legs she choose to arthritis. No fevers. She is hypertensive on arrival despite nitroglycerin. Notably she had a cardiac catheterization on June 25 that showed nonobstructive disease. His chest pain is similar to what she was admitted with previously.  The history is provided by the patient and the EMS personnel.    Past Medical History  Diagnosis Date  . Atrial fibrillation   . Coronary artery disease   . Dementia   . Hypertension   . Hyperlipidemia   . Anxiety   . GERD (gastroesophageal reflux disease)   . CHF (congestive heart failure)   . Polyp, stomach 11/11/2006  . Aphakia of left eye   . Obese   . MI (myocardial infarction)   . Sleep apnea   . Chest pain at rest 12/20/2011  . CAD (coronary artery disease), cutting balloon athrectomy of her dominant AV groove of LCX.  2007 12/20/2011  . Bradycardia 12/20/2011  . OSA on CPAP 12/20/2011  . HTN (hypertension) 12/20/2011  . Dyslipidemia  12/20/2011  . Diabetes mellitus     Past Surgical History  Procedure Date  . Immature cataract     left eye  . Back surgery   . Appendectomy   . Breast surgery   . Fracture surgery   . Dilation and curettage of uterus   . Abdominal hysterectomy   . Coronary angioplasty     Family History  Problem Relation Age of Onset  . Colon cancer      History  Substance Use Topics  . Smoking status: Never  Smoker   . Smokeless tobacco: Never Used  . Alcohol Use: No    OB History    Grav Para Term Preterm Abortions TAB SAB Ect Mult Living                  Review of Systems  Constitutional: Positive for appetite change. Negative for activity change.  Respiratory: Negative for cough, chest tightness and shortness of breath.   Cardiovascular: Positive for chest pain.  Genitourinary: Negative for dysuria and hematuria.    Allergies  Chicken allergy and Fish allergy  Home Medications   Current Outpatient Rx  Name Route Sig Dispense Refill  . ACETAMINOPHEN 325 MG PO TABS Oral Take 650 mg by mouth 3 (three) times daily.    Marland Kitchen AMLODIPINE BESYLATE 5 MG PO TABS Oral Take 5 mg by mouth daily.    . ASPIRIN 81 MG PO TBEC Oral Take 1 tablet (81 mg total) by mouth daily.    Marland Kitchen CALCIUM CARBONATE-VITAMIN D 500-200 MG-UNIT PO TABS Oral Take 1 tablet by mouth daily.    Marland Kitchen CETIRIZINE HCL 10 MG PO TABS Oral Take 10 mg by mouth daily.    Marland Kitchen VITAMIN D 1000 UNITS PO TABS Oral Take 1,000 Units by mouth daily.    Marland Kitchen CITALOPRAM HYDROBROMIDE 20 MG PO TABS Oral Take 20 mg by mouth daily.    Marland Kitchen  CLONIDINE HCL 0.2 MG PO TABS Oral Take 0.2 mg by mouth every 6 (six) hours as needed. For elevated blood pressure if SBP is >180 or DBP >100.    Marland Kitchen DOCUSATE SODIUM 100 MG PO CAPS Oral Take 100 mg by mouth 2 (two) times daily.    Marland Kitchen ESOMEPRAZOLE MAGNESIUM 40 MG PO CPDR Oral Take 40 mg by mouth daily before breakfast.    . EZETIMIBE 10 MG PO TABS Oral Take 10 mg by mouth at bedtime.    . FENOFIBRATE 48 MG PO TABS Oral Take 48 mg by mouth at bedtime.    Marland Kitchen FLUTICASONE PROPIONATE 50 MCG/ACT NA SUSP Nasal Place 2 sprays into the nose daily.    Marland Kitchen HYDROCODONE-ACETAMINOPHEN 5-325 MG PO TABS Oral Take 1 tablet by mouth 2 (two) times daily as needed. For pain    . HYDROCORTISONE ACETATE 25 MG RE SUPP Rectal Place 25 mg rectally 2 (two) times daily.    . ISOSORBIDE MONONITRATE ER 30 MG PO TB24 Oral Take 30 mg by mouth daily.    Marland Kitchen  KETOROLAC TROMETHAMINE 0.4 % OP SOLN Right Eye Place 1 drop into the right eye 2 (two) times daily.    Marland Kitchen LOSARTAN POTASSIUM 50 MG PO TABS Oral Take 100 mg by mouth daily.     Marland Kitchen METOCLOPRAMIDE HCL 5 MG PO TABS Oral Take 5 mg by mouth 4 (four) times daily -  before meals and at bedtime.     . NYSTATIN 100000 UNIT/GM EX POWD Topical Apply topically 3 (three) times daily. Apply under breast bilaterally after applying Nystatin cream for yeast rash.  Once rash has resolved, continue powder twice daily.    . NYSTATIN 100000 UNIT/GM EX CREA Topical Apply 1 application topically 3 (three) times daily. Apply under both breasts.    . OLOPATADINE HCL 0.2 % OP SOLN Both Eyes Place 1 drop into both eyes daily.    . OXYBUTYNIN CHLORIDE 5 MG PO TABS Oral Take 5 mg by mouth daily.    . OXYCODONE HCL 5 MG PO TABS Oral Take 5-10 mg by mouth every 6 (six) hours as needed. For pain    . OXYCODONE-ACETAMINOPHEN 5-325 MG PO TABS Oral Take 1 tablet by mouth every 4 (four) hours as needed. For pain    . POLYETHYLENE GLYCOL 3350 PO PACK Oral Take 17 g by mouth as directed. Mix 1 capful in 8oz of juice/water and drink 3 times each week    . POTASSIUM CHLORIDE ER 10 MEQ PO TBCR Oral Take 10 mEq by mouth 2 (two) times daily. Do not crush    . ROSUVASTATIN CALCIUM 5 MG PO TABS Oral Take 5 mg by mouth at bedtime.     Marland Kitchen SITAGLIPTIN PHOSPHATE 50 MG PO TABS Oral Take 50 mg by mouth daily.    Marland Kitchen SPIRONOLACTONE 25 MG PO TABS Oral Take 25 mg by mouth daily.    . SUCRALFATE 1 G PO TABS Oral Take 1 g by mouth 4 (four) times daily.    . ASPIRIN 81 MG PO CHEW Oral Chew 324 mg by mouth daily.    Marland Kitchen CLONAZEPAM 1 MG PO TABS Oral Take 0.5 mg by mouth daily as needed. For anxiety    . DICLOFENAC SODIUM 1 % TD GEL Topical Apply 1 application topically every 6 (six) hours as needed. Apply 4 grams to affected area. May keep in room.    Marland Kitchen NITROGLYCERIN 0.4 MG/SPRAY TL SOLN Sublingual Place 2 sprays under the tongue every  5 (five) minutes as needed.  Use 2 sprays under tongue as needed for chest pain. Repeat 2 sprays if pain persists after 15 minutes if pain still persists    . NITROGLYCERIN 0.4 MG SL SUBL Sublingual Place 0.4 mg under the tongue every 5 (five) minutes as needed.      BP 179/84  Pulse 72  Temp 98.9 F (37.2 C) (Oral)  Resp 26  SpO2 99%  Physical Exam  Constitutional: She is oriented to person, place, and time. She appears well-developed and well-nourished. No distress.  HENT:  Head: Normocephalic and atraumatic.  Mouth/Throat: Oropharynx is clear and moist. No oropharyngeal exudate.  Eyes: Conjunctivae and EOM are normal. Pupils are equal, round, and reactive to light.  Neck: Normal range of motion.  Cardiovascular: Normal rate, regular rhythm and normal heart sounds.   No murmur heard. Pulmonary/Chest: Effort normal and breath sounds normal. No respiratory distress. She exhibits tenderness.       Somewhat reproducible left chest wall pain, no rash  Abdominal: Soft. There is tenderness. There is no rebound and no guarding.  Musculoskeletal: Normal range of motion. She exhibits no edema and no tenderness.       +2 DP and PT pulses  Neurological: She is alert and oriented to person, place, and time. No cranial nerve deficit.  Skin: Skin is warm.    ED Course  Procedures (including critical care time)  Labs Reviewed  COMPREHENSIVE METABOLIC PANEL - Abnormal; Notable for the following:    Glucose, Bld 122 (*)     Total Protein 8.5 (*)     Alkaline Phosphatase 36 (*)     GFR calc non Af Amer 60 (*)     GFR calc Af Amer 69 (*)     All other components within normal limits  CARDIAC PANEL(CRET KIN+CKTOT+MB+TROPI) - Abnormal; Notable for the following:    Total CK 284 (*)     CK, MB 5.3 (*)     All other components within normal limits  URINALYSIS, ROUTINE W REFLEX MICROSCOPIC - Abnormal; Notable for the following:    Hgb urine dipstick TRACE (*)     Protein, ur 30 (*)     Leukocytes, UA TRACE (*)     All  other components within normal limits  URINE MICROSCOPIC-ADD ON - Abnormal; Notable for the following:    Bacteria, UA FEW (*)     All other components within normal limits  CBC WITH DIFFERENTIAL  PROTIME-INR  LACTIC ACID, PLASMA  OCCULT BLOOD, POC DEVICE  CARDIAC PANEL(CRET KIN+CKTOT+MB+TROPI)   Ct Abdomen Pelvis W Contrast  01/11/2012  *RADIOLOGY REPORT*  Clinical Data: Abdominal pain, chest pain.  Right lower quadrant pain.  CT ABDOMEN AND PELVIS WITH CONTRAST  Technique:  Multidetector CT imaging of the abdomen and pelvis was performed following the standard protocol during bolus administration of intravenous contrast.  Contrast: OMNIPAQUE IOHEXOL 300 MG/ML  SOLN  Comparison: 11/25/2008  Findings: Dense coronary artery calcifications are present.  Heart is borderline in size.  Dependent atelectasis in the lung bases. No effusions.  Liver, gallbladder, spleen, pancreas, adrenals are unremarkable. Areas of cortical thinning and scarring in the right kidney.  No hydronephrosis bilaterally.  Appendix is not definitively seen, but no inflammatory process in the right lower quadrant.  Scattered colonic diverticula noted.  No active diverticulitis.  Small bowel is decompressed.  Aorta is calcified, non-aneurysmal.  No free fluid, free air or adenopathy. Prior hysterectomy.  No adnexal masses.  Urinary bladder  grossly unremarkable.  No acute bony abnormality.  Degenerative changes in the lumbar spine.  IMPRESSION: No acute findings in the abdomen or pelvis.  Areas of scarring in the right kidney.  Dependent atelectasis.  Coronary artery disease.  Original Report Authenticated By: Cyndie Chime, M.D.   Dg Abd Acute W/chest  01/11/2012  *RADIOLOGY REPORT*  Clinical Data: Chest and abdominal pain.  Nausea, vomiting.  ACUTE ABDOMEN SERIES (ABDOMEN 2 VIEW & CHEST 1 VIEW)  Comparison: 11/25/2008  Findings: Heart is upper limits normal in size.  Lungs are clear. Prominence of the superior mediastinum  likely related to tortuous vasculature.  No effusions.  Nonobstructive bowel gas pattern.  Moderate stool burden throughout the colon.  No evidence of obstruction or free air.  No organomegaly or acute bony abnormality.  No suspicious calcification.  Degenerative changes in the lumbar spine and hips.  IMPRESSION: No evidence of bowel obstruction or free air.  Borderline cardiomegaly.  No acute findings.  Original Report Authenticated By: Cyndie Chime, M.D.     No diagnosis found.    MDM  Nursing home patient with atypical chest wall pain this morning. Recent nonocclusive LHC.   Blood pressure improved with home medications. Chest pain is reproducible in setting of nonocclusive recent heart cath, doubt ACS. Exam history not consistent with PE. EKG nonischemic, troponin neg.   CT negative for acute pathology.  Patient tolerating PO.   D/w PA Leron Croak of [facility] who will contact patient for an office visit.     Date: 01/11/2012  Rate: 77  Rhythm: normal sinus rhythm  QRS Axis: normal  Intervals: normal  ST/T Wave abnormalities: normal  Conduction Disutrbances:none  Narrative Interpretation:   Old EKG Reviewed: unchanged    Glynn Octave, MD 01/11/12 1611

## 2012-01-11 NOTE — ED Notes (Signed)
Patient took 2 cups of H20 without difficulty.

## 2012-01-13 ENCOUNTER — Encounter: Payer: Self-pay | Admitting: Internal Medicine

## 2012-01-13 ENCOUNTER — Ambulatory Visit (INDEPENDENT_AMBULATORY_CARE_PROVIDER_SITE_OTHER): Payer: Medicare Other | Admitting: Internal Medicine

## 2012-01-13 VITALS — BP 166/78 | HR 88 | Ht 63.0 in | Wt 193.1 lb

## 2012-01-13 DIAGNOSIS — K5900 Constipation, unspecified: Secondary | ICD-10-CM

## 2012-01-13 DIAGNOSIS — R079 Chest pain, unspecified: Secondary | ICD-10-CM

## 2012-01-13 DIAGNOSIS — R1314 Dysphagia, pharyngoesophageal phase: Secondary | ICD-10-CM

## 2012-01-13 DIAGNOSIS — R1310 Dysphagia, unspecified: Secondary | ICD-10-CM | POA: Insufficient documentation

## 2012-01-13 MED ORDER — POLYETHYLENE GLYCOL 3350 17 G PO PACK: 17.0000 g | PACK | Freq: Every day | ORAL | Status: DC

## 2012-01-13 NOTE — Patient Instructions (Addendum)
My office will arrange for you to have an upper endoscopy with dilation done at [facility]. We will call you with the date, time and orders.   Take your Miralax every day for constipation.  Thank you for choosing me and [facility].

## 2012-01-13 NOTE — Progress Notes (Signed)
Subjective:    Patient ID: [name], female    DOB: 1926/12/09, 78 y.o.   MRN: [id number] Referred by: Florentina Jenny, MD  HPI This is a delightful 78 year old African American woman here with a caregiver from her nursing home, [facility]. She is complaining of a 78 year history of intermittent solid more than liquid dysphagia. She describes a suprasternal sticking point. It is painful and she has burning when the food goes down. She was recently admitted to the hospital in late June and had a cardiac evaluation including cardiac enzymes and a a catheterization that did not reveal a source of this pain. She is on maximal acid suppression with PPI, Carafate and Reglan treatment. She still has the problems. She complains of frequent nausea but says she cannot vomit. She does not have much in the way of abdominal pain. He was in the emergency department 78 yesterday with chest pain where labs, CT scan of the abdomen and pelvis were unrevealing.  She is complaining of constipation, not moving her bowels well, she is on a stool softener and uses MiraLax every 3 days. Her GI review of systems is otherwise negative at this time. Note that she does not cough when she is swallowing liquids.  Allergies  Allergen Reactions  . Chicken Allergy   . Fish Allergy    Outpatient Prescriptions Prior to Visit  Medication Sig Dispense Refill  . acetaminophen (TYLENOL) 325 MG tablet Take 650 mg by mouth 3 (three) times daily.      Marland Kitchen amLODipine (NORVASC) 5 MG tablet Take 10 mg by mouth daily.       Marland Kitchen aspirin 81 MG chewable tablet Chew 324 mg by mouth daily.      . calcium-vitamin D (OSCAL WITH D) 500-200 MG-UNIT per tablet Take 1 tablet by mouth daily.      . cetirizine (ZYRTEC) 10 MG tablet Take 10 mg by mouth daily.      . cholecalciferol (VITAMIN D) 1000 UNITS tablet Take 1,000 Units by mouth daily.      . citalopram (CELEXA) 20 MG tablet Take 20 mg by mouth daily.      . clonazePAM (KLONOPIN) 1 MG tablet  Take 0.5 mg by mouth daily as needed. For anxiety      . diclofenac sodium (VOLTAREN) 1 % GEL Apply 1 application topically every 6 (six) hours as needed. Apply 4 grams to affected area. May keep in room.      . docusate sodium (COLACE) 100 MG capsule Take 100 mg by mouth 2 (two) times daily.      Marland Kitchen esomeprazole (NEXIUM) 40 MG capsule Take 40 mg by mouth daily before breakfast.      . ezetimibe (ZETIA) 10 MG tablet Take 10 mg by mouth at bedtime.      . fenofibrate (TRICOR) 48 MG tablet Take 48 mg by mouth at bedtime.      . fluticasone (FLONASE) 50 MCG/ACT nasal spray Place 2 sprays into the nose daily.      Marland Kitchen HYDROcodone-acetaminophen (NORCO/VICODIN) 5-325 MG per tablet Take 1 tablet by mouth 2 (two) times daily as needed. For pain      . hydrocortisone (HEMORRHOIDAL-HC) 25 MG suppository Place 25 mg rectally 2 (two) times daily.      . isosorbide mononitrate (IMDUR) 30 MG 24 hr tablet Take 30 mg by mouth daily.      Marland Kitchen ketorolac (ACULAR) 0.4 % SOLN Place 1 drop into the right eye 2 (two) times daily.      Marland Kitchen  losartan (COZAAR) 50 MG tablet Take 100 mg by mouth daily.       . metoCLOPramide (REGLAN) 5 MG tablet Take 5 mg by mouth 4 (four) times daily -  before meals and at bedtime.       . nitroGLYCERIN (NITROLINGUAL) 0.4 MG/SPRAY spray Place 2 sprays under the tongue every 5 (five) minutes as needed. Use 2 sprays under tongue as needed for chest pain. Repeat 2 sprays if pain persists after 15 minutes if pain still persists      . nystatin (MYCOSTATIN/NYSTOP) 100000 UNIT/GM POWD Apply topically 3 (three) times daily. Apply under breast bilaterally after applying Nystatin cream for yeast rash.  Once rash has resolved, continue powder twice daily.      Marland Kitchen nystatin cream (MYCOSTATIN) Apply 1 application topically 3 (three) times daily. Apply under both breasts.      . Olopatadine HCl (PATADAY) 0.2 % SOLN Place 1 drop into both eyes daily.      Marland Kitchen oxybutynin (DITROPAN) 5 MG tablet Take 5 mg by mouth daily.       Marland Kitchen oxyCODONE-acetaminophen (PERCOCET/ROXICET) 5-325 MG per tablet Take 1 tablet by mouth every 4 (four) hours as needed. For pain      . potassium chloride (K-DUR) 10 MEQ tablet Take 10 mEq by mouth 2 (two) times daily. Do not crush      . rosuvastatin (CRESTOR) 5 MG tablet Take 5 mg by mouth at bedtime.       . sitaGLIPtin (JANUVIA) 50 MG tablet Take 50 mg by mouth daily.      Marland Kitchen spironolactone (ALDACTONE) 25 MG tablet Take 25 mg by mouth daily.      . sucralfate (CARAFATE) 1 G tablet Take 1 g by mouth 4 (four) times daily.      . polyethylene glycol (MIRALAX / GLYCOLAX) packet Take 17 g by mouth as directed. Mix 1 capful in 8oz of juice/water and drink 3 times each week      . aspirin EC 81 MG EC tablet Take 1 tablet (81 mg total) by mouth daily.      . cloNIDine (CATAPRES) 0.2 MG tablet Take 0.2 mg by mouth every 6 (six) hours as needed. For elevated blood pressure if SBP is >180 or DBP >100.      . nitroGLYCERIN (NITROSTAT) 0.4 MG SL tablet Place 0.4 mg under the tongue every 5 (five) minutes as needed.      Marland Kitchen oxyCODONE (OXY IR/ROXICODONE) 5 MG immediate release tablet Take 5-10 mg by mouth every 6 (six) hours as needed. For pain       Past Medical History  Diagnosis Date  . Atrial fibrillation   . Coronary artery disease   . Dementia   . Hypertension   . Hyperlipidemia   . Anxiety   . GERD (gastroesophageal reflux disease)   . CHF (congestive heart failure)   . Polyp, stomach 11/11/2006  . Aphakia of left eye   . Obese   . MI (myocardial infarction)   . Sleep apnea   . Chest pain at rest 12/20/2011  . CAD (coronary artery disease), cutting balloon athrectomy of her dominant AV groove of LCX.  2007 12/20/2011  . Bradycardia 12/20/2011  . OSA on CPAP 12/20/2011  . HTN (hypertension) 12/20/2011  . Dyslipidemia  12/20/2011  . Diabetes mellitus   . Cardiomyopathy   . DJD (degenerative joint disease)   . Osteoarthritis   . History of GI bleed    Past Surgical History  Procedure  Date  . Immature cataract     left eye  . Back surgery   . Appendectomy   . Breast surgery   . Fracture surgery   . Dilation and curettage of uterus   . Abdominal hysterectomy   . Coronary angioplasty   . Esophagogastroduodenoscopy   . Colonoscopy   . Eus    History   Social History  . Marital Status: Widowed    Spouse Name: N/A    Number of Children: 0  . Years of Education: N/A   Social History Main Topics  . Smoking status: Never Smoker   . Smokeless tobacco: Never Used  . Alcohol Use: No  . Drug Use: No  . 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  Family History  Problem Relation Age of Onset  . Colon cancer Mother   . Heart disease Mother   . Stroke Mother         Review of Systems This is positive for a cough, dyspnea, she has diffuse body aches and stiffness as well as burning lower extremity pain. She has a sleep apnea problem and uses CPAP. She does not sleep well. She denies urinary incontinence her problems. All other review of systems are negative or as per history of present illness.    Objective:   Physical Exam General:  Pleasant elderly black woman in a wheelchair. Eyes:  anicteric., There is bilateral arcus, the left pupil is chronically dilated ENT:   Mouth and posterior pharynx free of lesions. , She has only a few teeth in poor repair remaining though Neck:   supple w/o thyromegaly or mass.  Lungs: Clear to auscultation bilaterally. Heart:  S1S2, no rubs, murmurs, gallops. Abdomen:  soft, non-tender, no hepatosplenomegaly, hernia, or mass and BS+.  Lymph:  no cervical or supraclavicular adenopathy. Extremities:   no edema Neuro:  A&O x 3.  Psych:  appropriate mood and  Affect.   Data Reviewed:discharge summary June 2013, emergency room note and CT scan report from yesterday as well as labs, reveals upper endoscopy and colonoscopy  reports from the 2000s there is no history of esophageal stricture        Assessment & Plan:   1. Esophageal dysphagia   2. Chest pain   Differential diagnosis includes peptic stricture, esophageal dysmotility, reflux, infection. Malignancy is in the differential but does not seem likely. Will plan for an upper GI endoscopy with dilation, this will be scheduled at the hospital given her comorbidities. The risks and benefits as well as alternatives of endoscopic procedure(s) have been discussed and reviewed. All questions answered. The patient agrees to proceed. It seems like cardiac etiology is adequately excluded at this point.   3. Constipation   Increase MiraLax to daily. I explained to her that immobility and medications are likely causing this. She may need intermittent Dulcolax suppository or pills. Medication modification would help, I am waiting for her endoscopy to modify her GI regimen.

## 2012-01-14 ENCOUNTER — Telehealth: Payer: Self-pay

## 2012-01-14 ENCOUNTER — Other Ambulatory Visit: Payer: Self-pay

## 2012-01-14 DIAGNOSIS — R1310 Dysphagia, unspecified: Secondary | ICD-10-CM

## 2012-01-14 NOTE — Telephone Encounter (Signed)
Ok - pacemaker will take precedence

## 2012-01-14 NOTE — Telephone Encounter (Signed)
[facility] called to inform us that pt has appointment 01/19/12 to evaluate about a pacemaker with Dr. Rachelle Hora Croitoru.  Wanted to pass this information on to you Sir.

## 2012-01-14 NOTE — Telephone Encounter (Signed)
Set up the EGD/Dil at [facility] for 01/26/12 at 1:00pm, arrive at 11:30 am.  Case # [id number].  Peggy at [facility] given details regarding procedure and faxed her instructions to aid in prepping pt.  She said it will be the pts. Niece going with her so I called and left her a detailed message, date and time of EGD.  However Peggy informed me of the possibility that pt may need pace maker per Dr. Allyson Sabal.  She has appointment with Nada Boozer PA today.  I have called and a message will be given to the nurse so that they are aware of EGD appointment and can let us know if heart is an issue. The lady that came with pt yest to her visit was not aware of the possible pace maker.

## 2012-01-14 NOTE — Telephone Encounter (Signed)
Message copied by [place], Jesyka Slaght E on Fri Jan 14, 2012  9:51 AM ------      Message from: Stan Head E      Created: Thu Jan 13, 2012  4:39 PM      Regarding: schedule EGD/dilation       Looks like early afternoon 7/31 is the first available time I could do the EGD/dilation on this patient.            Please arrange this for [name]             I do not need fluoro

## 2012-01-14 NOTE — Progress Notes (Signed)
Orders put in for EGD/Dil at [facility] on 01/26/12.

## 2012-01-17 ENCOUNTER — Telehealth: Payer: Self-pay

## 2012-01-17 NOTE — Telephone Encounter (Signed)
Spoke to [employer] (neice) to confirm that she got my message Friday.  She did regarding date/time for EGD/Dil at [facility] 01/26/12.  And I told her per Dr. Leone Payor we will just wait and see what cardio appt yields regarding need for pacemaker.  That may have to be done prior to EGD.

## 2012-01-18 ENCOUNTER — Encounter: Payer: Self-pay | Admitting: Gastroenterology

## 2012-01-18 ENCOUNTER — Telehealth: Payer: Self-pay | Admitting: Internal Medicine

## 2012-01-18 ENCOUNTER — Other Ambulatory Visit: Payer: Self-pay | Admitting: *Deleted

## 2012-01-18 NOTE — Telephone Encounter (Signed)
Scheduled patient on 02/21/12 8:30/9:30 AM at [facility] endo for EGD with Dil. Booking number [id number](Jill)Left a message for Ms. Weldon to call me

## 2012-01-18 NOTE — Telephone Encounter (Signed)
Spoke with patient's caregiver and she wants to cancel hospital procedure on 01/26/12 because she cannot bring patient to appointment that day due to her work schedule. She would like to reschedule in August prefers a Monday around 11:30 AM. Dr. Leone Payor, do you want me to schedule this on your hospital week in August?

## 2012-01-18 NOTE — Telephone Encounter (Signed)
Yes reschedule on hospital week. She may be getting a pacemaker also (before then) FYI

## 2012-01-19 ENCOUNTER — Encounter: Payer: Self-pay | Admitting: *Deleted

## 2012-01-19 NOTE — Telephone Encounter (Signed)
Spoke with [name] and Mays her the new appointment date and time. She requested the new instructions be mailed to [facility] [address] [place], [place] [address]. Mailed these out.

## 2012-01-26 ENCOUNTER — Ambulatory Visit (HOSPITAL_COMMUNITY): Admit: 2012-01-26 | Payer: Self-pay | Admitting: Internal Medicine

## 2012-01-26 ENCOUNTER — Encounter (HOSPITAL_COMMUNITY): Payer: Self-pay

## 2012-01-26 SURGERY — EGD (ESOPHAGOGASTRODUODENOSCOPY)
Anesthesia: Moderate Sedation

## 2012-02-21 ENCOUNTER — Encounter (HOSPITAL_COMMUNITY): Payer: Self-pay

## 2012-02-21 ENCOUNTER — Ambulatory Visit (HOSPITAL_COMMUNITY)
Admission: RE | Admit: 2012-02-21 | Discharge: 2012-02-21 | Disposition: A | Discharge status: Home / Self Care | Payer: Medicare Other | Source: Ambulatory Visit | Attending: Internal Medicine | Admitting: Internal Medicine

## 2012-02-21 ENCOUNTER — Encounter (HOSPITAL_COMMUNITY)
Admission: RE | Disposition: A | Discharge status: Home / Self Care | Payer: Self-pay | Source: Ambulatory Visit | Attending: Internal Medicine

## 2012-02-21 DIAGNOSIS — E669 Obesity, unspecified: Secondary | ICD-10-CM | POA: Insufficient documentation

## 2012-02-21 DIAGNOSIS — R1310 Dysphagia, unspecified: Secondary | ICD-10-CM | POA: Insufficient documentation

## 2012-02-21 DIAGNOSIS — D131 Benign neoplasm of stomach: Secondary | ICD-10-CM | POA: Diagnosis present

## 2012-02-21 DIAGNOSIS — I252 Old myocardial infarction: Secondary | ICD-10-CM | POA: Insufficient documentation

## 2012-02-21 DIAGNOSIS — F0390 Unspecified dementia without behavioral disturbance: Secondary | ICD-10-CM | POA: Insufficient documentation

## 2012-02-21 DIAGNOSIS — I2510 Atherosclerotic heart disease of native coronary artery without angina pectoris: Secondary | ICD-10-CM | POA: Insufficient documentation

## 2012-02-21 DIAGNOSIS — K219 Gastro-esophageal reflux disease without esophagitis: Secondary | ICD-10-CM | POA: Insufficient documentation

## 2012-02-21 DIAGNOSIS — R079 Chest pain, unspecified: Secondary | ICD-10-CM | POA: Insufficient documentation

## 2012-02-21 DIAGNOSIS — E119 Type 2 diabetes mellitus without complications: Secondary | ICD-10-CM | POA: Insufficient documentation

## 2012-02-21 DIAGNOSIS — G4733 Obstructive sleep apnea (adult) (pediatric): Secondary | ICD-10-CM | POA: Insufficient documentation

## 2012-02-21 DIAGNOSIS — E785 Hyperlipidemia, unspecified: Secondary | ICD-10-CM | POA: Insufficient documentation

## 2012-02-21 DIAGNOSIS — I10 Essential (primary) hypertension: Secondary | ICD-10-CM | POA: Insufficient documentation

## 2012-02-21 HISTORY — PX: ESOPHAGOGASTRODUODENOSCOPY: SHX5428

## 2012-02-21 HISTORY — DX: Shortness of breath: R06.02

## 2012-02-21 HISTORY — PX: BALLOON DILATION: SHX5330

## 2012-02-21 LAB — GLUCOSE, CAPILLARY: Glucose-Capillary: 101 mg/dL — ABNORMAL HIGH (ref 70–99)

## 2012-02-21 SURGERY — EGD (ESOPHAGOGASTRODUODENOSCOPY)
Anesthesia: Moderate Sedation

## 2012-02-21 MED ORDER — FENTANYL CITRATE 0.05 MG/ML IJ SOLN
INTRAMUSCULAR | Status: AC
  Filled 2012-02-21: qty 2

## 2012-02-21 MED ORDER — MIDAZOLAM HCL 10 MG/2ML IJ SOLN
INTRAMUSCULAR | Status: DC | PRN
  Administered 2012-02-21 (×3): 1 mg via INTRAVENOUS
  Administered 2012-02-21: 2 mg via INTRAVENOUS
  Administered 2012-02-21 (×2): 1 mg via INTRAVENOUS

## 2012-02-21 MED ORDER — MIDAZOLAM HCL 10 MG/2ML IJ SOLN
INTRAMUSCULAR | Status: AC
  Filled 2012-02-21: qty 2

## 2012-02-21 MED ORDER — FENTANYL CITRATE 0.05 MG/ML IJ SOLN
INTRAMUSCULAR | Status: DC | PRN
  Administered 2012-02-21: 12.5 ug via INTRAVENOUS
  Administered 2012-02-21: 25 ug via INTRAVENOUS
  Administered 2012-02-21: 12.5 ug via INTRAVENOUS

## 2012-02-21 MED ORDER — BUTAMBEN-TETRACAINE-BENZOCAINE 2-2-14 % EX AERO
INHALATION_SPRAY | CUTANEOUS | Status: DC | PRN
  Administered 2012-02-21: 2 via TOPICAL

## 2012-02-21 MED ORDER — DIPHENHYDRAMINE HCL 50 MG/ML IJ SOLN
INTRAMUSCULAR | Status: AC
  Filled 2012-02-21: qty 1

## 2012-02-21 NOTE — Op Note (Addendum)
[facility] [address] [place], [address]   ENDOSCOPY PROCEDURE REPORT  PATIENT: [name], Mays  MR#: [id number] BIRTHDATE: 05-06-1927 , 85  yrs. old GENDER: Female ENDOSCOPIST: Iva Boop, MD, Clementeen Graham REFERRED BY:  Florentina Jenny, M.D. PROCEDURE DATE:  02/21/2012 PROCEDURE:  EGD w/ biopsy and Maloney dilation of esophagus ASA CLASS:     Class IV INDICATIONS:  dysphagia.   chest pain. MEDICATIONS: Fentanyl 50 mg IV and Versed 7 mg IV TOPICAL ANESTHETIC: Cetacaine Spray  DESCRIPTION OF PROCEDURE: After the risks benefits and alternatives of the procedure were thoroughly explained, informed consent was obtained.  The Pentax Gastroscope [id number] endoscope was introduced through the mouth and advanced to the second portion of the duodenum. Without limitations.  The instrument was slowly withdrawn as the mucosa was fully examined.      STOMACH: A sessile polyp measuring 15 mm in size was found in the prepyloric region of the stomach.  Multiple biopsies were performed using cold forceps.  Sample sent for histology.  The remainder of the upper endoscopy exam was otherwise normal. Retroflexed views revealed no abnormalities.     The scope was then withdrawn from the patient, a 71 [place] Maloney dilator was passed without difficulty or heme, slight resistance,  and the procedure completed.  COMPLICATIONS: There were no complications. ENDOSCOPIC IMPRESSION: 1.   Sessile polyp measuring 15 mm in size was found in the prepyloric region of the stomach; multiple biopsies 2.   The remainder of the upper endoscopy exam was otherwise normal 3.   Maloney dilation of esophagus performed (54 Fr) due to dysphagia.   RECOMMENDATIONS: 1.  Clear liquids until , then soft foods rest of day.  Resume prior diet tomorrow. 2.  Office will call with results and plans - (consider Januvia as a cause of pain)    eSigned:  Iva Boop, MD, [facility] 02/21/2012 10:13  AM   XB:JYNWG Tripp, MD and The Patient

## 2012-02-21 NOTE — H&P (Signed)
  This is a delightful 78 year old African American woman She is complaining of a 1 year history of intermittent solid more than liquid dysphagia. She describes a suprasternal sticking point. It is painful and she has burning when the food goes down. She was recently admitted to the hospital in late June and had a cardiac evaluation including cardiac enzymes and a a catheterization that did not reveal a source of this pain. She is on maximal acid suppression with PPI, Carafate and Reglan treatment. She still has the problems. She complains of frequent nausea but says she cannot vomit. She does not have much in the way of abdominal pain. She was in the emergency department in July with chest pain where labs, CT scan of the abdomen and pelvis were unrevealing.  Allergies  Allergen Reactions  . Chicken Allergy   . Fish Allergy    @ENCMEDSTART @ Past Medical History  Diagnosis Date  . Atrial fibrillation   . Coronary artery disease   . Dementia   . Hypertension   . Hyperlipidemia   . Anxiety   . GERD (gastroesophageal reflux disease)   . CHF (congestive heart failure)   . Polyp, stomach 11/11/2006  . Aphakia of left eye   . Obese   . MI (myocardial infarction)   . Sleep apnea   . Chest pain at rest 12/20/2011  . CAD (coronary artery disease), cutting balloon athrectomy of her dominant AV groove of LCX.  2007 12/20/2011  . Bradycardia 12/20/2011  . OSA on CPAP 12/20/2011  . HTN (hypertension) 12/20/2011  . Dyslipidemia  12/20/2011  . Diabetes mellitus   . Cardiomyopathy   . DJD (degenerative joint disease)   . Osteoarthritis   . History of GI bleed   . Shortness of breath    Past Surgical History  Procedure Date  . Immature cataract     left eye  . Back surgery   . Appendectomy   . Breast surgery   . Fracture surgery   . Dilation and curettage of uterus   . Abdominal hysterectomy   . Coronary angioplasty   . Esophagogastroduodenoscopy   . Colonoscopy   . Eus    History    Social History  . Marital Status: Widowed    Spouse Name: N/A    Number of Children: 0  . Years of Education: N/A   Social History Main Topics  . Smoking status: Never Smoker   . Smokeless tobacco: Never Used  . Alcohol Use: No  . Drug Use: No  . Sexually Active: No   Other Topics Concern  . 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 She raised many of her nieces and nephews, she also has a sister in [place]. Never smoker never alcohol.   Family History  Problem Relation Age of Onset  . Colon cancer Mother   . Heart disease Mother   . Stroke Mother        PE:  General:  NAD Eyes:   anicteric Lungs:  clear Heart:  S1S2 no rubs, murmurs or gallops Abdomen:  soft and nontender, BS+   Ass/Plan:  Dysphagia and chest pain  Plan egd  The risks, benefits, and alternatives to endoscopy with possible biopsy and possible dilation were discussed with the patient and they consent to proceed.

## 2012-02-22 ENCOUNTER — Encounter (HOSPITAL_COMMUNITY): Payer: Self-pay | Admitting: Internal Medicine

## 2012-04-04 ENCOUNTER — Ambulatory Visit (INDEPENDENT_AMBULATORY_CARE_PROVIDER_SITE_OTHER): Payer: Medicare Other | Admitting: Internal Medicine

## 2012-04-04 ENCOUNTER — Encounter: Payer: Self-pay | Admitting: Internal Medicine

## 2012-04-04 VITALS — BP 112/68 | HR 68 | Ht 63.0 in | Wt 189.8 lb

## 2012-04-04 DIAGNOSIS — R1310 Dysphagia, unspecified: Secondary | ICD-10-CM

## 2012-04-04 DIAGNOSIS — D131 Benign neoplasm of stomach: Secondary | ICD-10-CM

## 2012-04-04 DIAGNOSIS — R1314 Dysphagia, pharyngoesophageal phase: Secondary | ICD-10-CM

## 2012-04-04 NOTE — Patient Instructions (Addendum)
Per Dr. Leone Payor all foods including vegetables need to be chopped up before eating.  Follow-up with [place] as needed.  Thank you for choosing me and [facility].  Iva Boop, M.D., [facility]

## 2012-04-04 NOTE — Progress Notes (Signed)
Patient ID: [name], female   DOB: [birth year], 78 y.o.   MRN: [id number]  The patient presents in followup after she underwent Maloney dilation of the esophagus in late August. She reports her dysphagia is somewhat better but foods like broccoli or cauliflower will hanging she needs to drink water to make those go down. Her meats are generally chopped when she eats them and those are not a problem. She still has some nausea and vague chest pain.  Is also a hyperplastic antral gastric polyp found. Biopsies negative for H. pylori.   Medications, allergies, past medical history, past surgical history, family history and social history are reviewed and updated in the EMR.   He is a well-developed elderly African American woman in no acute distress, she is overweight to obese She is missing some teeth   Assessment  1. Esophageal dysphagia with some vague minor chest pain and nausea in the setting of advanced age and numerous medications. She is also missing some teeth.   2. benign gastric polyp - hypper plastic, Recommendations  1. Chop all foods including the vegetables. 2. Continue to follow food with liquids 3. No further GI investigation is warranted in my opinion. I do not think this polyp lead to any harm and would not remove it  4.  Return to primary care.  CC: Florentina Jenny, MD

## 2012-07-19 ENCOUNTER — Ambulatory Visit (HOSPITAL_COMMUNITY): Payer: Medicare Other

## 2012-07-19 ENCOUNTER — Emergency Department (HOSPITAL_COMMUNITY): Payer: Medicare Other

## 2012-07-19 ENCOUNTER — Emergency Department (HOSPITAL_COMMUNITY)
Admission: EM | Admit: 2012-07-19 | Discharge: 2012-07-19 | Disposition: A | Discharge status: Home / Self Care | Payer: Medicare Other | Attending: Emergency Medicine | Admitting: Emergency Medicine

## 2012-07-19 ENCOUNTER — Encounter (HOSPITAL_COMMUNITY): Payer: Self-pay | Admitting: Emergency Medicine

## 2012-07-19 DIAGNOSIS — R142 Eructation: Secondary | ICD-10-CM | POA: Insufficient documentation

## 2012-07-19 DIAGNOSIS — Z8719 Personal history of other diseases of the digestive system: Secondary | ICD-10-CM | POA: Insufficient documentation

## 2012-07-19 DIAGNOSIS — I252 Old myocardial infarction: Secondary | ICD-10-CM | POA: Insufficient documentation

## 2012-07-19 DIAGNOSIS — I509 Heart failure, unspecified: Secondary | ICD-10-CM | POA: Insufficient documentation

## 2012-07-19 DIAGNOSIS — Z8679 Personal history of other diseases of the circulatory system: Secondary | ICD-10-CM | POA: Insufficient documentation

## 2012-07-19 DIAGNOSIS — R141 Gas pain: Secondary | ICD-10-CM | POA: Insufficient documentation

## 2012-07-19 DIAGNOSIS — I2510 Atherosclerotic heart disease of native coronary artery without angina pectoris: Secondary | ICD-10-CM | POA: Insufficient documentation

## 2012-07-19 DIAGNOSIS — K219 Gastro-esophageal reflux disease without esophagitis: Secondary | ICD-10-CM | POA: Insufficient documentation

## 2012-07-19 DIAGNOSIS — I10 Essential (primary) hypertension: Secondary | ICD-10-CM | POA: Insufficient documentation

## 2012-07-19 DIAGNOSIS — F0390 Unspecified dementia without behavioral disturbance: Secondary | ICD-10-CM | POA: Insufficient documentation

## 2012-07-19 DIAGNOSIS — R143 Flatulence: Secondary | ICD-10-CM | POA: Insufficient documentation

## 2012-07-19 DIAGNOSIS — F411 Generalized anxiety disorder: Secondary | ICD-10-CM | POA: Insufficient documentation

## 2012-07-19 DIAGNOSIS — Z8739 Personal history of other diseases of the musculoskeletal system and connective tissue: Secondary | ICD-10-CM | POA: Insufficient documentation

## 2012-07-19 DIAGNOSIS — E785 Hyperlipidemia, unspecified: Secondary | ICD-10-CM | POA: Insufficient documentation

## 2012-07-19 DIAGNOSIS — Z79899 Other long term (current) drug therapy: Secondary | ICD-10-CM | POA: Insufficient documentation

## 2012-07-19 DIAGNOSIS — Z7982 Long term (current) use of aspirin: Secondary | ICD-10-CM | POA: Insufficient documentation

## 2012-07-19 DIAGNOSIS — Z8669 Personal history of other diseases of the nervous system and sense organs: Secondary | ICD-10-CM | POA: Insufficient documentation

## 2012-07-19 DIAGNOSIS — K5900 Constipation, unspecified: Secondary | ICD-10-CM

## 2012-07-19 LAB — COMPREHENSIVE METABOLIC PANEL
ALT: 18 U/L (ref 0–35)
Albumin: 4.3 g/dL (ref 3.5–5.2)
Alkaline Phosphatase: 27 U/L — ABNORMAL LOW (ref 39–117)
Chloride: 99 mEq/L (ref 96–112)
GFR calc Af Amer: 49 mL/min — ABNORMAL LOW (ref >90)
Glucose, Bld: 102 mg/dL — ABNORMAL HIGH (ref 70–99)
Potassium: 4.2 mEq/L (ref 3.5–5.1)
Sodium: 135 mEq/L (ref 135–145)
Total Protein: 7.9 g/dL (ref 6.0–8.3)

## 2012-07-19 LAB — CBC WITH DIFFERENTIAL/PLATELET
Eosinophils Absolute: 0.2 10*3/uL (ref 0.0–0.7)
Lymphs Abs: 1.6 10*3/uL (ref 0.7–4.0)
MCH: 28.5 pg (ref 26.0–34.0)
Neutro Abs: 2.3 10*3/uL (ref 1.7–7.7)
Neutrophils Relative %: 51 % (ref 43–77)
Platelets: 284 10*3/uL (ref 150–400)
RBC: 3.93 MIL/uL (ref 3.87–5.11)
WBC: 4.6 10*3/uL (ref 4.0–10.5)

## 2012-07-19 LAB — URINALYSIS, ROUTINE W REFLEX MICROSCOPIC
Glucose, UA: NEGATIVE mg/dL
Hgb urine dipstick: NEGATIVE
Specific Gravity, Urine: 1.01 (ref 1.005–1.030)

## 2012-07-19 LAB — PRO B NATRIURETIC PEPTIDE: Pro B Natriuretic peptide (BNP): 45.2 pg/mL (ref 0–450)

## 2012-07-19 LAB — TROPONIN I: Troponin I: <0.3 ng/mL (ref <0.30)

## 2012-07-19 MED ORDER — ONDANSETRON HCL 4 MG/2ML IJ SOLN
4.0000 mg | Freq: Once | INTRAMUSCULAR | Status: AC
  Administered 2012-07-19: 4 mg via INTRAVENOUS
  Filled 2012-07-19: qty 2

## 2012-07-19 MED ORDER — SODIUM CHLORIDE 0.9 % IV SOLN
INTRAVENOUS | Status: DC
  Administered 2012-07-19: 08:00:00 via INTRAVENOUS

## 2012-07-19 MED ORDER — IOHEXOL 300 MG/ML  SOLN
50.0000 mL | Freq: Once | INTRAMUSCULAR | Status: AC | PRN
  Administered 2012-07-19: 50 mL via INTRAVENOUS

## 2012-07-19 MED ORDER — MAGNESIUM CITRATE PO SOLN: 296.0000 mL | Freq: Once | ORAL | Status: DC

## 2012-07-19 MED ORDER — MORPHINE SULFATE 4 MG/ML IJ SOLN
4.0000 mg | Freq: Once | INTRAMUSCULAR | Status: AC
  Administered 2012-07-19: 4 mg via INTRAVENOUS
  Filled 2012-07-19: qty 1

## 2012-07-19 MED ORDER — IOHEXOL 300 MG/ML  SOLN
100.0000 mL | Freq: Once | INTRAMUSCULAR | Status: AC | PRN
  Administered 2012-07-19: 100 mL via INTRAVENOUS

## 2012-07-19 NOTE — ED Notes (Signed)
Pt c/o abd pain. Pt sts she has not had a BM in month.

## 2012-07-19 NOTE — ED Provider Notes (Signed)
History     CSN: [id number]  Arrival date & time 07/19/12  0700   First MD Initiated Contact with Patient 07/19/12 [phone]      Chief Complaint  Patient presents with  . Abdominal Pain    (Consider location/radiation/quality/duration/timing/severity/associated sxs/prior treatment) HPI Comments: Patient complains of abdominal pain and constipation for more than a week. She says she has had problems with her bowels not moving before. She has acid reflux and also has trouble swallowing at times. She says sometimes her food doesn't go down correctly. This is been going on for about a year. She has not had a fever. She denies shortness of breath. There has not been any vomiting or diarrhea.  Patient is a 79 y.o. female presenting with abdominal pain.  Abdominal Pain The primary symptoms of the illness include abdominal pain.  Additional symptoms associated with the illness include constipation.    Past Medical History  Diagnosis Date  . Coronary artery disease   . Dementia   . Hypertension   . Hyperlipidemia   . Anxiety   . GERD (gastroesophageal reflux disease)   . CHF (congestive heart failure)   . Polyp, stomach 11/11/2006  . Aphakia of left eye   . Obese   . MI (myocardial infarction)   . Sleep apnea   . Chest pain at rest 12/20/2011  . CAD (coronary artery disease), cutting balloon athrectomy of her dominant AV groove of LCX.  2007 12/20/2011  . Bradycardia 12/20/2011  . OSA on CPAP 12/20/2011  . HTN (hypertension) 12/20/2011  . Dyslipidemia  12/20/2011  . Diabetes mellitus   . Cardiomyopathy   . DJD (degenerative joint disease)   . Osteoarthritis   . History of GI bleed   . Shortness of breath   . Atrial fibrillation     Past Surgical History  Procedure Date  . Immature cataract     left eye  . Back surgery   . Appendectomy   . Breast surgery   . Fracture surgery   . Dilation and curettage of uterus   . Abdominal hysterectomy   . Coronary angioplasty   .  Esophagogastroduodenoscopy   . Colonoscopy   . Eus   . Esophagogastroduodenoscopy 02/21/2012    Procedure: ESOPHAGOGASTRODUODENOSCOPY (EGD);  Surgeon: Iva Boop, MD;  Location: [facility];  Service: Endoscopy;  Laterality: N/A;  pt. being evaluated for a pacemaker  . Balloon dilation 02/21/2012    Procedure: BALLOON DILATION;  Surgeon: Iva Boop, MD;  Location: [facility];  Service: Endoscopy;  Laterality: N/A;    Family History  Problem Relation Age of Onset  . Colon cancer Mother   . Heart disease Mother   . Stroke Mother     History  Substance Use Topics  . Smoking status: Never Smoker   . Smokeless tobacco: Never Used  . Alcohol Use: No    OB History    Grav Para Term Preterm Abortions TAB SAB Ect Mult Living                  Review of Systems  Gastrointestinal: Positive for abdominal pain, constipation and abdominal distention.  All other systems reviewed and are negative.    Allergies  Chicken allergy and Fish allergy  Home Medications   Current Outpatient Rx  Name  Route  Sig  Dispense  Refill  . ACETAMINOPHEN 325 MG PO TABS   Oral   Take 650 mg by mouth 3 (three) times daily.         Marland Kitchen  AMLODIPINE BESYLATE 5 MG PO TABS   Oral   Take 10 mg by mouth daily.          . ASPIRIN 81 MG PO CHEW   Oral   Chew 324 mg by mouth daily.         Marland Kitchen CALCIUM CARBONATE-VITAMIN D 500-200 MG-UNIT PO TABS   Oral   Take 1 tablet by mouth daily.         Marland Kitchen CETIRIZINE HCL 10 MG PO TABS   Oral   Take 10 mg by mouth daily.         Marland Kitchen VITAMIN D 1000 UNITS PO TABS   Oral   Take 1,000 Units by mouth daily.         Marland Kitchen CITALOPRAM HYDROBROMIDE 20 MG PO TABS   Oral   Take 20 mg by mouth daily.         Marland Kitchen CLONAZEPAM 1 MG PO TABS   Oral   Take 0.5 mg by mouth daily as needed. For anxiety         . DICLOFENAC SODIUM 1 % TD GEL   Topical   Apply 1 application topically every 6 (six) hours as needed. Apply 4 grams to affected area. May keep in  room.         . DOCUSATE SODIUM 100 MG PO CAPS   Oral   Take 100 mg by mouth 2 (two) times daily.         Marland Kitchen ESOMEPRAZOLE MAGNESIUM 40 MG PO CPDR   Oral   Take 40 mg by mouth daily before breakfast.         . EZETIMIBE 10 MG PO TABS   Oral   Take 10 mg by mouth at bedtime.         . FENOFIBRATE 48 MG PO TABS   Oral   Take 48 mg by mouth at bedtime.         Marland Kitchen FLUTICASONE PROPIONATE 50 MCG/ACT NA SUSP   Nasal   Place 2 sprays into the nose daily.         Marland Kitchen HYDROCODONE-ACETAMINOPHEN 5-325 MG PO TABS   Oral   Take 1 tablet by mouth 2 (two) times daily as needed. For pain         . HYDROCORTISONE ACETATE 25 MG RE SUPP   Rectal   Place 25 mg rectally 2 (two) times daily.         . ISOSORBIDE MONONITRATE ER 30 MG PO TB24   Oral   Take 30 mg by mouth daily.         Marland Kitchen KETOROLAC TROMETHAMINE 0.4 % OP SOLN   Right Eye   Place 1 drop into the right eye 2 (two) times daily.         Marland Kitchen LOSARTAN POTASSIUM 50 MG PO TABS   Oral   Take 100 mg by mouth daily.          Marland Kitchen METOCLOPRAMIDE HCL 5 MG PO TABS   Oral   Take 5 mg by mouth 4 (four) times daily -  before meals and at bedtime.          Marland Kitchen NITROGLYCERIN 0.4 MG/SPRAY TL SOLN   Sublingual   Place 2 sprays under the tongue every 5 (five) minutes as needed. Use 2 sprays under tongue as needed for chest pain. Repeat 2 sprays if pain persists after 15 minutes if pain still persists         .  NYSTATIN 100000 UNIT/GM EX POWD   Topical   Apply topically 3 (three) times daily. Apply under breast bilaterally after applying Nystatin cream for yeast rash.  Once rash has resolved, continue powder twice daily.         . NYSTATIN 100000 UNIT/GM EX CREA   Topical   Apply 1 application topically 3 (three) times daily. Apply under both breasts.         . OLOPATADINE HCL 0.2 % OP SOLN   Both Eyes   Place 1 drop into both eyes daily.         . OXYBUTYNIN CHLORIDE 5 MG PO TABS   Oral   Take 5 mg by mouth  daily.         . OXYCODONE-ACETAMINOPHEN 5-325 MG PO TABS   Oral   Take 1 tablet by mouth every 4 (four) hours as needed. For pain         . POLYETHYLENE GLYCOL 3350 PO PACK   Oral   Take 17 g by mouth daily. Mix 1 capful in 8oz of juice/water and drink 3 times each week   14 each      . POTASSIUM CHLORIDE ER 10 MEQ PO TBCR   Oral   Take 10 mEq by mouth 2 (two) times daily. Do not crush         . ROSUVASTATIN CALCIUM 5 MG PO TABS   Oral   Take 5 mg by mouth at bedtime.          Marland Kitchen SITAGLIPTIN PHOSPHATE 50 MG PO TABS   Oral   Take 50 mg by mouth daily.         Marland Kitchen SPIRONOLACTONE 25 MG PO TABS   Oral   Take 25 mg by mouth daily.         . SUCRALFATE 1 G PO TABS   Oral   Take 1 g by mouth 4 (four) times daily.           BP 179/62  Pulse 64  Temp 98.8 F (37.1 C) (Oral)  Resp 18  SpO2 96%  Physical Exam  Constitutional: She is oriented to person, place, and time. She appears well-developed and well-nourished. No distress.  HENT:  Head: Normocephalic and atraumatic.  Right Ear: Hearing normal.  Nose: Nose normal.  Mouth/Throat: Oropharynx is clear and moist and mucous membranes are normal.  Eyes: Conjunctivae normal and EOM are normal. Pupils are equal, round, and reactive to light.  Neck: Normal range of motion. Neck supple.  Cardiovascular: Normal rate, regular rhythm, S1 normal and S2 normal.  Exam reveals no gallop and no friction rub.   No murmur heard. Pulmonary/Chest: Effort normal and breath sounds normal. No respiratory distress. She exhibits no tenderness.  Abdominal: Soft. Normal appearance and bowel sounds are normal. She exhibits distension. There is no hepatosplenomegaly. There is generalized tenderness. There is no rebound, no guarding, no tenderness at McBurney's point and negative Murphy's sign. No hernia.  Musculoskeletal: Normal range of motion.  Neurological: She is alert and oriented to person, place, and time. She has normal strength.  No cranial nerve deficit or sensory deficit. Coordination normal. GCS eye subscore is 4. GCS verbal subscore is 5. GCS motor subscore is 6.  Skin: Skin is warm, dry and intact. No rash noted. No cyanosis.  Psychiatric: She has a normal mood and affect. Her speech is normal and behavior is normal. Thought content normal.    ED Course  Procedures (including critical care  time)   Date: 07/19/2012  Rate: 62  Rhythm: normal sinus rhythm  QRS Axis: left  Intervals: normal  ST/T Wave abnormalities: normal  Conduction Disutrbances:none  Narrative Interpretation:   Old EKG Reviewed: unchanged    Labs Reviewed  CBC WITH DIFFERENTIAL - Abnormal; Notable for the following:    Hemoglobin 11.2 (*)     HCT 34.7 (*)     All other components within normal limits  COMPREHENSIVE METABOLIC PANEL - Abnormal; Notable for the following:    Glucose, Bld 102 (*)     Creatinine, Ser 1.17 (*)     Alkaline Phosphatase 27 (*)     GFR calc non Af Amer 42 (*)     GFR calc Af Amer 49 (*)     All other components within normal limits  LACTIC ACID, PLASMA  LIPASE, BLOOD  URINALYSIS, ROUTINE W REFLEX MICROSCOPIC  PRO B NATRIURETIC PEPTIDE  TROPONIN I        Diagnosis: Constipation    MDM  Patient presents to the ER for evaluation of abdominal pain, distention and constipation. Symptoms have been present for a week. Patient does have a previous history of constipation, has been on MiraLax and Dulcolax for this, but reports that it is not helping. Patient did have mild diffuse abdominal tenderness without guarding, rebound or acute surgical process identified on examination. A CAT scan was performed to further evaluate. CT scan does confirm constipation without any other acute features. Patient to be treated as an outpatient for chronic constipation.       Gilda Crease, MD 07/19/12 1210

## 2012-07-19 NOTE — ED Notes (Signed)
[id number]<ON> Expected date:<BR> Expected time:<BR> Means of arrival:<BR> Comments:<BR> Medic Fall

## 2012-07-26 ENCOUNTER — Encounter (HOSPITAL_COMMUNITY): Payer: Self-pay | Admitting: *Deleted

## 2012-07-26 ENCOUNTER — Emergency Department (HOSPITAL_COMMUNITY)
Admission: EM | Admit: 2012-07-26 | Discharge: 2012-07-26 | Disposition: A | Discharge status: Skilled Nursing Facility | Payer: Medicare Other | Attending: Emergency Medicine | Admitting: Emergency Medicine

## 2012-07-26 DIAGNOSIS — K219 Gastro-esophageal reflux disease without esophagitis: Secondary | ICD-10-CM | POA: Insufficient documentation

## 2012-07-26 DIAGNOSIS — H9209 Otalgia, unspecified ear: Secondary | ICD-10-CM | POA: Insufficient documentation

## 2012-07-26 DIAGNOSIS — I252 Old myocardial infarction: Secondary | ICD-10-CM | POA: Insufficient documentation

## 2012-07-26 DIAGNOSIS — Z7982 Long term (current) use of aspirin: Secondary | ICD-10-CM | POA: Insufficient documentation

## 2012-07-26 DIAGNOSIS — J3489 Other specified disorders of nose and nasal sinuses: Secondary | ICD-10-CM | POA: Insufficient documentation

## 2012-07-26 DIAGNOSIS — I509 Heart failure, unspecified: Secondary | ICD-10-CM | POA: Insufficient documentation

## 2012-07-26 DIAGNOSIS — R05 Cough: Secondary | ICD-10-CM | POA: Insufficient documentation

## 2012-07-26 DIAGNOSIS — I10 Essential (primary) hypertension: Secondary | ICD-10-CM | POA: Insufficient documentation

## 2012-07-26 DIAGNOSIS — F0390 Unspecified dementia without behavioral disturbance: Secondary | ICD-10-CM | POA: Insufficient documentation

## 2012-07-26 DIAGNOSIS — E669 Obesity, unspecified: Secondary | ICD-10-CM | POA: Insufficient documentation

## 2012-07-26 DIAGNOSIS — Z9981 Dependence on supplemental oxygen: Secondary | ICD-10-CM | POA: Insufficient documentation

## 2012-07-26 DIAGNOSIS — J069 Acute upper respiratory infection, unspecified: Secondary | ICD-10-CM | POA: Insufficient documentation

## 2012-07-26 DIAGNOSIS — Z8679 Personal history of other diseases of the circulatory system: Secondary | ICD-10-CM | POA: Insufficient documentation

## 2012-07-26 DIAGNOSIS — E785 Hyperlipidemia, unspecified: Secondary | ICD-10-CM | POA: Insufficient documentation

## 2012-07-26 DIAGNOSIS — R059 Cough, unspecified: Secondary | ICD-10-CM | POA: Insufficient documentation

## 2012-07-26 DIAGNOSIS — F411 Generalized anxiety disorder: Secondary | ICD-10-CM | POA: Insufficient documentation

## 2012-07-26 DIAGNOSIS — Z8739 Personal history of other diseases of the musculoskeletal system and connective tissue: Secondary | ICD-10-CM | POA: Insufficient documentation

## 2012-07-26 DIAGNOSIS — IMO0002 Reserved for concepts with insufficient information to code with codable children: Secondary | ICD-10-CM | POA: Insufficient documentation

## 2012-07-26 DIAGNOSIS — H5789 Other specified disorders of eye and adnexa: Secondary | ICD-10-CM | POA: Insufficient documentation

## 2012-07-26 DIAGNOSIS — Z8669 Personal history of other diseases of the nervous system and sense organs: Secondary | ICD-10-CM | POA: Insufficient documentation

## 2012-07-26 DIAGNOSIS — I2510 Atherosclerotic heart disease of native coronary artery without angina pectoris: Secondary | ICD-10-CM | POA: Insufficient documentation

## 2012-07-26 DIAGNOSIS — Z9861 Coronary angioplasty status: Secondary | ICD-10-CM | POA: Insufficient documentation

## 2012-07-26 DIAGNOSIS — R52 Pain, unspecified: Secondary | ICD-10-CM | POA: Insufficient documentation

## 2012-07-26 DIAGNOSIS — Z79899 Other long term (current) drug therapy: Secondary | ICD-10-CM | POA: Insufficient documentation

## 2012-07-26 DIAGNOSIS — E119 Type 2 diabetes mellitus without complications: Secondary | ICD-10-CM | POA: Insufficient documentation

## 2012-07-26 DIAGNOSIS — G4733 Obstructive sleep apnea (adult) (pediatric): Secondary | ICD-10-CM | POA: Insufficient documentation

## 2012-07-26 MED ORDER — IBUPROFEN 200 MG PO TABS
400.0000 mg | ORAL_TABLET | Freq: Once | ORAL | Status: AC
  Administered 2012-07-26: 400 mg via ORAL
  Filled 2012-07-26: qty 2

## 2012-07-26 MED ORDER — SALINE SPRAY 0.65 % NA SOLN
1.0000 | Freq: Once | NASAL | Status: AC
  Administered 2012-07-26: 1 via NASAL
  Filled 2012-07-26: qty 44

## 2012-07-26 MED ORDER — SALINE NASAL SPRAY 0.65 % NA SOLN: 1.0000 | NASAL | Status: DC | PRN

## 2012-07-26 MED ORDER — ACETAMINOPHEN 325 MG PO TABS
650.0000 mg | ORAL_TABLET | Freq: Once | ORAL | Status: DC
  Filled 2012-07-26: qty 2

## 2012-07-26 NOTE — ED Notes (Signed)
[id number]<AV> Expected date:<BR> Expected time:<BR> Means of arrival:<BR> Comments:<BR> Flu s/s

## 2012-07-26 NOTE — ED Notes (Signed)
Pt arrives by [facility] from [facility] assisted living for c/o HA, cough and nasal congestion starting this AM.

## 2012-07-26 NOTE — ED Provider Notes (Signed)
History     CSN: [id number]  Arrival date & time 07/26/12  1213   First MD Initiated Contact with Patient 07/26/12 1215      No chief complaint on file.   (Consider location/radiation/quality/duration/timing/severity/associated sxs/prior treatment) HPI  An 79 year old female with history of dementia, coronary disease, and MI presents complaining of flu symptoms.  Patient lives at a nursing facility, she was complaining of headache this morning and was brought to the ER for further evaluation. Patient reports for the past 3 days she has flu like symptoms with sharp shooting headache across her forehead, which has been waxing and waning, with associated itchy eyes, running nose, nasal congestions, ear pain, throat irritation, and mild body aches. The symptoms gradually onset, mild to moderate in severity. She denies double vision, chest pain, shortness of breath, abdominal pain, back pain, rash. She has had trouble with constipation but did have a bowel movement this morning. She is not sure she has history of high blood pressure and takes blood pressure medication 3 times daily. She did take her morning medication. She reports having similar headaches in the past. She denies productive cough, or hemoptysis. Otherwise patient has no other complaint.  Past Medical History  Diagnosis Date  . Coronary artery disease   . Dementia   . Hypertension   . Hyperlipidemia   . Anxiety   . GERD (gastroesophageal reflux disease)   . CHF (congestive heart failure)   . Polyp, stomach 11/11/2006  . Aphakia of left eye   . Obese   . MI (myocardial infarction)   . Sleep apnea   . Chest pain at rest 12/20/2011  . CAD (coronary artery disease), cutting balloon athrectomy of her dominant AV groove of LCX.  2007 12/20/2011  . Bradycardia 12/20/2011  . OSA on CPAP 12/20/2011  . HTN (hypertension) 12/20/2011  . Dyslipidemia  12/20/2011  . Diabetes mellitus   . Cardiomyopathy   . DJD (degenerative joint disease)    . Osteoarthritis   . History of GI bleed   . Shortness of breath   . Atrial fibrillation     Past Surgical History  Procedure Date  . Immature cataract     left eye  . Back surgery   . Appendectomy   . Breast surgery   . Fracture surgery   . Dilation and curettage of uterus   . Abdominal hysterectomy   . Coronary angioplasty   . Esophagogastroduodenoscopy   . Colonoscopy   . Eus   . Esophagogastroduodenoscopy 02/21/2012    Procedure: ESOPHAGOGASTRODUODENOSCOPY (EGD);  Surgeon: Iva Boop, MD;  Location: [facility];  Service: Endoscopy;  Laterality: N/A;  pt. being evaluated for a pacemaker  . Balloon dilation 02/21/2012    Procedure: BALLOON DILATION;  Surgeon: Iva Boop, MD;  Location: [facility];  Service: Endoscopy;  Laterality: N/A;    Family History  Problem Relation Age of Onset  . Colon cancer Mother   . Heart disease Mother   . Stroke Mother     History  Substance Use Topics  . Smoking status: Never Smoker   . Smokeless tobacco: Never Used  . Alcohol Use: No    OB History    Grav Para Term Preterm Abortions TAB SAB Ect Mult Living                  Review of Systems  Constitutional:       10 Systems reviewed and all are negative for acute change except as  noted in the HPI.     Allergies  Chicken allergy and Fish allergy  Home Medications   Current Outpatient Rx  Name  Route  Sig  Dispense  Refill  . ACETAMINOPHEN 325 MG PO TABS   Oral   Take 650 mg by mouth 3 (three) times daily.         Marland Kitchen AMLODIPINE BESYLATE 5 MG PO TABS   Oral   Take 10 mg by mouth daily.          . ASPIRIN 81 MG PO TABS   Oral   Take 81 mg by mouth daily.         Marland Kitchen CALCIUM CARBONATE-VITAMIN D 500-200 MG-UNIT PO TABS   Oral   Take 1 tablet by mouth daily.         Marland Kitchen CETIRIZINE HCL 10 MG PO TABS   Oral   Take 10 mg by mouth daily.         Marland Kitchen VITAMIN D 1000 UNITS PO TABS   Oral   Take 1,000 Units by mouth daily.         Marland Kitchen CITALOPRAM  HYDROBROMIDE 20 MG PO TABS   Oral   Take 20 mg by mouth daily.         Marland Kitchen CLONAZEPAM 1 MG PO TABS   Oral   Take 0.5 mg by mouth daily as needed. For anxiety         . DICLOFENAC SODIUM 1 % TD GEL   Topical   Apply 1 application topically every 6 (six) hours as needed. Apply 4 grams to affected area. May keep in room.         Marland Kitchen ESOMEPRAZOLE MAGNESIUM 40 MG PO CPDR   Oral   Take 40 mg by mouth daily before breakfast.         . EZETIMIBE 10 MG PO TABS   Oral   Take 10 mg by mouth at bedtime.         . FENOFIBRATE 48 MG PO TABS   Oral   Take 48 mg by mouth at bedtime.         Marland Kitchen FERROUS SULFATE 325 (65 FE) MG PO TABS   Oral   Take 325 mg by mouth daily with breakfast.         . FLUTICASONE PROPIONATE 50 MCG/ACT NA SUSP   Nasal   Place 2 sprays into the nose daily.         Marland Kitchen HYDRALAZINE HCL 25 MG PO TABS   Oral   Take 25 mg by mouth 3 (three) times daily.         Marland Kitchen HYDROCODONE-ACETAMINOPHEN 5-325 MG PO TABS   Oral   Take 1 tablet by mouth 2 (two) times daily as needed. For pain         . HYDROCORTISONE ACETATE 25 MG RE SUPP   Rectal   Place 25 mg rectally 2 (two) times daily.         . ISOSORBIDE MONONITRATE ER 30 MG PO TB24   Oral   Take 30 mg by mouth daily.         Marland Kitchen KETOROLAC TROMETHAMINE 0.4 % OP SOLN   Right Eye   Place 1 drop into the right eye 2 (two) times daily.         Marland Kitchen LOSARTAN POTASSIUM-HCTZ 100-25 MG PO TABS   Oral   Take 1 tablet by mouth daily.         Marland Kitchen  MAGNESIUM CITRATE PO SOLN   Oral   Take 296 mLs by mouth once.   300 mL   0   . METOCLOPRAMIDE HCL 5 MG PO TABS   Oral   Take 5 mg by mouth 4 (four) times daily -  before meals and at bedtime.          Marland Kitchen NITROGLYCERIN 0.4 MG/SPRAY TL SOLN   Sublingual   Place 2 sprays under the tongue every 5 (five) minutes as needed. Use 2 sprays under tongue as needed for chest pain. Repeat 2 sprays if pain persists after 15 minutes if pain still persists         .  NYSTATIN 100000 UNIT/GM EX POWD   Topical   Apply topically 3 (three) times daily. Apply under breast bilaterally after applying Nystatin cream for yeast rash.  Once rash has resolved, continue powder twice daily.         . NYSTATIN 100000 UNIT/GM EX CREA   Topical   Apply 1 application topically 3 (three) times daily. Apply under both breasts.         Marland Kitchen PATADAY OP   Ophthalmic   Apply 1 drop to eye daily.         . OLOPATADINE HCL 0.2 % OP SOLN   Both Eyes   Place 1 drop into both eyes daily.         . OXYCODONE-ACETAMINOPHEN 7.5-325 MG PO TABS   Oral   Take 1 tablet by mouth 3 (three) times daily.         Marland Kitchen POLYETHYLENE GLYCOL 3350 PO PACK   Oral   Take 17 g by mouth daily. Mix 1 capful in 8oz of juice/water and drink 3 times each week   14 each      . POTASSIUM CHLORIDE ER 10 MEQ PO TBCR   Oral   Take 10 mEq by mouth 2 (two) times daily. Do not crush         . ROSUVASTATIN CALCIUM 5 MG PO TABS   Oral   Take 5 mg by mouth at bedtime.          . SENNA 8.6 MG PO TABS   Oral   Take 1 tablet by mouth 2 (two) times daily.         Marland Kitchen SPIRONOLACTONE 25 MG PO TABS   Oral   Take 25 mg by mouth daily.         . SUCRALFATE 1 G PO TABS   Oral   Take 1 g by mouth 4 (four) times daily.           There were no vitals taken for this visit.  Physical Exam  Nursing note and vitals reviewed. Constitutional: She is oriented to person, place, and time. She appears well-developed and well-nourished. No distress.  HENT:  Head: Normocephalic and atraumatic.  Right Ear: External ear normal.  Left Ear: External ear normal.  Nose: Nose normal.  Mouth/Throat: No oropharyngeal exudate.  Eyes: Conjunctivae normal and EOM are normal. Pupils are equal, round, and reactive to light.  Neck: Normal range of motion. Neck supple. No JVD present.  Cardiovascular: Normal rate and regular rhythm.   Pulmonary/Chest: Effort normal. She has no wheezes. She exhibits no  tenderness.  Abdominal: Soft. Bowel sounds are normal. There is no tenderness.  Lymphadenopathy:    She has no cervical adenopathy.  Neurological: She is alert and oriented to person, place, and time. She has normal strength. No  cranial nerve deficit or sensory deficit. GCS eye subscore is 4. GCS verbal subscore is 5. GCS motor subscore is 6.    ED Course  Procedures (including critical care time)  Labs Reviewed - No data to display No results found.   No diagnosis found.  12:33 PM Pt was seen and evaluated.  Pt c/o of headache and URI sxs.  Examination unremarkable.  No focal neuro deficits concerning for acute stroke.  Denies cp or sob.  Lung's clear to auscultation.  Doubt active cardiac or angina pain.  No acute resp distress.  Pt does have elevated BP of 193/59, however upon reviewing prior visit pt has had persistent hypertension.    Pt was seen in ER a weeks ago for evaluation of abd pain and constipation.  CT scan confirms constipation but shows no acute abnormality.  Pt has 1 BM this AM, denies increase abd pain.  Does not have any significant pain today.  Will give ocean spray nasal spray for her URI sxs.  Care discussed with attending.  1:31 PM My attending has evaluated pt and felt sxs likely due to sinus congestion and URI and less likely BP related.  Repeat BP is 170 systolic. Since pt has an extensive list of medications that she is one, i recommend taking her at home pain meds as needed.  Will prescribe nasal spray only.  Pt to f/u with PCP for reevaluation of her HTN.  Return precaution discussed.  Pt stable for discharge.    1. URI  MDM  BP 175/51  Pulse 64  Temp 98.5 F (36.9 C) (Oral)  Resp 16  SpO2 97%  I have reviewed nursing notes and vital signs.   I reviewed available ER/hospitalization records thought the EMR          Fayrene Helper, [place] 07/26/12 1333

## 2012-07-26 NOTE — ED Notes (Signed)
PA at bedside evaluating pt

## 2012-07-26 NOTE — ED Notes (Signed)
[facility] contacted transported pt back to [facility].

## 2012-07-26 NOTE — ED Notes (Signed)
Pt c/o HA and ear pain. Also reports pain behind right nare. Reports HA x's 3 days.

## 2012-07-26 NOTE — ED Provider Notes (Signed)
Medical screening examination/treatment/procedure(s) were conducted as a shared visit with non-physician practitioner(s) and myself.  I personally evaluated the patient during the encounter Pt with sinus sx and c/o of HA.  Seems HA is caused by the sinus sx.  No hx to suggest bacterial sinusitis.  Pt is HTN but do not feel that is the cause of the HA.  Repeat BP is 175/51.  Will give nasal spray and return for worseningsx.  Gwyneth Sprout, MD 07/26/12 1435

## 2012-07-26 NOTE — Progress Notes (Signed)
CSW reviewed chart.  Pt is from [facility], ALF.  CSW met with pt at bedside.  Pt very pleasant and cooperative.  Pt is oriented to person, place and time.  Pt confirmed that she was from [facility], [facility].  CSW reviewed pt with EDP and RN.  EDP stated pt would be d/c today back to [facility].  CSW is available to assist as necessary.  Vickii Penna, LCSWA [phone]  Clinical Social Work

## 2012-08-09 ENCOUNTER — Ambulatory Visit: Payer: Medicare Other | Admitting: Internal Medicine

## 2012-08-22 ENCOUNTER — Other Ambulatory Visit (INDEPENDENT_AMBULATORY_CARE_PROVIDER_SITE_OTHER): Payer: Medicare Other

## 2012-08-22 ENCOUNTER — Ambulatory Visit (INDEPENDENT_AMBULATORY_CARE_PROVIDER_SITE_OTHER): Payer: Medicare Other | Admitting: Internal Medicine

## 2012-08-22 ENCOUNTER — Encounter: Payer: Self-pay | Admitting: Internal Medicine

## 2012-08-22 VITALS — BP 164/74 | HR 68 | Ht 63.0 in | Wt 179.0 lb

## 2012-08-22 DIAGNOSIS — D649 Anemia, unspecified: Secondary | ICD-10-CM

## 2012-08-22 DIAGNOSIS — K5900 Constipation, unspecified: Secondary | ICD-10-CM

## 2012-08-22 DIAGNOSIS — R198 Other specified symptoms and signs involving the digestive system and abdomen: Secondary | ICD-10-CM

## 2012-08-22 DIAGNOSIS — R194 Change in bowel habit: Secondary | ICD-10-CM

## 2012-08-22 DIAGNOSIS — K625 Hemorrhage of anus and rectum: Secondary | ICD-10-CM

## 2012-08-22 MED ORDER — POLYETHYLENE GLYCOL 3350 17 G PO PACK: PACK | ORAL | Status: DC

## 2012-08-22 MED ORDER — POLYETHYLENE GLYCOL 3350 17 G PO PACK: 17.0000 g | PACK | Freq: Every day | ORAL | Status: DC

## 2012-08-22 NOTE — Patient Instructions (Addendum)
Your physician has requested that you go to the basement for the following lab work before leaving today: Ferritin  Make sure and use Miralax every day.  I have faxed a Miralax rx to [facility] # [phone].  You have been scheduled for a colonoscopy with propofol. Please follow written instructions given to you at your visit today.  Please use the suprep kit you have been given today. If you use inhalers (even only as needed) or a CPAP machine, please bring them with you on the day of your procedure.  Thank you for choosing me and [facility].  Iva Boop, M.D., [facility]

## 2012-08-22 NOTE — Progress Notes (Signed)
  Subjective:    Patient ID: [name], female    DOB: 1927/03/23, 79 y.o.   MRN: [id number]  HPI Increasing constipation - weeks w/o bowel movement. Intermittent rectal bleeding also. Stools smaller in caliber at tmes also.   Review of Systems Uses a walker but can get to exam table with mild assistance    Objective:   Physical Exam General:  NAD, elderly Eyes:   anicteric Lungs:  clear Heart:  S1S2 no rubs, murmurs or gallops Abdomen:  soft and nontender, BS+ Rectal: Normal anoderm, no mass, reduced resting tone but decent voluntary squeeze and appropriate abdominal contraction and descent with Valsalva Anoscopy: Small tags, no active hemorrhoids  Femal staff present for anoscopy and rectal exam    Assessment & Plan:  Rectal bleeding - do not see hemorrhoids at anoscopy today so ? More proximal source Constipation Change in bowel habits Anemia   1. Evaluate with colonoscopy to exclude malignancy as a cause 2. MiraLax qd not every 3 days as now 3. Check ferritin - anemia is mild and chrnic and could be chronic disease - may not need ferrous sulfate that can constipate The risks and benefits as well as alternatives of endoscopic procedure(s) have been discussed and reviewed. All questions answered. The patient agrees to proceed.   UU:[id number], Sherilyn Cooter, MD

## 2012-08-23 NOTE — Progress Notes (Signed)
Quick Note:  Ferritin ok Please stop iron ______

## 2012-08-28 ENCOUNTER — Telehealth: Payer: Self-pay | Admitting: Internal Medicine

## 2012-08-28 NOTE — Telephone Encounter (Signed)
Patient's daughter will keep the appt as scheduled.  She is advised that there is no where to move her to that week and would need to reschedule for the first week in May.  She will work on finding a ride.

## 2012-08-29 ENCOUNTER — Telehealth: Payer: Self-pay | Admitting: Internal Medicine

## 2012-08-29 NOTE — Telephone Encounter (Signed)
Spoke to [facility] where she lives, their pharmacy didn't realized we gave her the suprep kit.  Kim confirmed they have it and Selena Batten is going to call the pharmacy and let them know not needed.

## 2012-09-05 ENCOUNTER — Telehealth: Payer: Self-pay | Admitting: Internal Medicine

## 2012-09-05 NOTE — Telephone Encounter (Signed)
Patient has had several day history of vomiting and diarrhea.  According to [facility] there is a stomach virus that has affected several residents.  She is rescheduled to 10/24/12 9:45 at [facility].  I have faxed a new set of instructions to attention Rose at [phone].

## 2012-09-23 ENCOUNTER — Encounter (HOSPITAL_COMMUNITY): Payer: Self-pay | Admitting: *Deleted

## 2012-09-23 ENCOUNTER — Emergency Department (HOSPITAL_COMMUNITY): Payer: Medicare Other

## 2012-09-23 ENCOUNTER — Emergency Department (HOSPITAL_COMMUNITY)
Admission: EM | Admit: 2012-09-23 | Discharge: 2012-09-23 | Disposition: A | Discharge status: Skilled Nursing Facility | Payer: Medicare Other | Attending: Emergency Medicine | Admitting: Emergency Medicine

## 2012-09-23 DIAGNOSIS — Z8739 Personal history of other diseases of the musculoskeletal system and connective tissue: Secondary | ICD-10-CM | POA: Insufficient documentation

## 2012-09-23 DIAGNOSIS — I10 Essential (primary) hypertension: Secondary | ICD-10-CM | POA: Insufficient documentation

## 2012-09-23 DIAGNOSIS — R1013 Epigastric pain: Secondary | ICD-10-CM | POA: Insufficient documentation

## 2012-09-23 DIAGNOSIS — Z9861 Coronary angioplasty status: Secondary | ICD-10-CM | POA: Insufficient documentation

## 2012-09-23 DIAGNOSIS — R109 Unspecified abdominal pain: Secondary | ICD-10-CM

## 2012-09-23 DIAGNOSIS — Z87442 Personal history of urinary calculi: Secondary | ICD-10-CM | POA: Insufficient documentation

## 2012-09-23 DIAGNOSIS — K4090 Unilateral inguinal hernia, without obstruction or gangrene, not specified as recurrent: Secondary | ICD-10-CM

## 2012-09-23 DIAGNOSIS — E785 Hyperlipidemia, unspecified: Secondary | ICD-10-CM | POA: Insufficient documentation

## 2012-09-23 DIAGNOSIS — Z8719 Personal history of other diseases of the digestive system: Secondary | ICD-10-CM | POA: Insufficient documentation

## 2012-09-23 DIAGNOSIS — F411 Generalized anxiety disorder: Secondary | ICD-10-CM | POA: Insufficient documentation

## 2012-09-23 DIAGNOSIS — Z860100 Personal history of colon polyps, unspecified: Secondary | ICD-10-CM | POA: Insufficient documentation

## 2012-09-23 DIAGNOSIS — N289 Disorder of kidney and ureter, unspecified: Secondary | ICD-10-CM | POA: Insufficient documentation

## 2012-09-23 DIAGNOSIS — K5900 Constipation, unspecified: Secondary | ICD-10-CM | POA: Insufficient documentation

## 2012-09-23 DIAGNOSIS — Z79899 Other long term (current) drug therapy: Secondary | ICD-10-CM | POA: Insufficient documentation

## 2012-09-23 DIAGNOSIS — G4733 Obstructive sleep apnea (adult) (pediatric): Secondary | ICD-10-CM | POA: Insufficient documentation

## 2012-09-23 DIAGNOSIS — K219 Gastro-esophageal reflux disease without esophagitis: Secondary | ICD-10-CM | POA: Insufficient documentation

## 2012-09-23 DIAGNOSIS — Z9981 Dependence on supplemental oxygen: Secondary | ICD-10-CM | POA: Insufficient documentation

## 2012-09-23 DIAGNOSIS — E669 Obesity, unspecified: Secondary | ICD-10-CM | POA: Insufficient documentation

## 2012-09-23 DIAGNOSIS — I509 Heart failure, unspecified: Secondary | ICD-10-CM | POA: Insufficient documentation

## 2012-09-23 DIAGNOSIS — I2510 Atherosclerotic heart disease of native coronary artery without angina pectoris: Secondary | ICD-10-CM | POA: Insufficient documentation

## 2012-09-23 DIAGNOSIS — Z7982 Long term (current) use of aspirin: Secondary | ICD-10-CM | POA: Insufficient documentation

## 2012-09-23 DIAGNOSIS — E119 Type 2 diabetes mellitus without complications: Secondary | ICD-10-CM | POA: Insufficient documentation

## 2012-09-23 DIAGNOSIS — F0390 Unspecified dementia without behavioral disturbance: Secondary | ICD-10-CM | POA: Insufficient documentation

## 2012-09-23 DIAGNOSIS — IMO0002 Reserved for concepts with insufficient information to code with codable children: Secondary | ICD-10-CM | POA: Insufficient documentation

## 2012-09-23 LAB — CBC WITH DIFFERENTIAL/PLATELET
Basophils Absolute: 0 10*3/uL (ref 0.0–0.1)
Basophils Relative: 0 % (ref 0–1)
Eosinophils Absolute: 0.3 10*3/uL (ref 0.0–0.7)
HCT: 31.5 % — ABNORMAL LOW (ref 36.0–46.0)
Hemoglobin: 10.4 g/dL — ABNORMAL LOW (ref 12.0–15.0)
MCH: 28.3 pg (ref 26.0–34.0)
MCV: 85.6 fL (ref 78.0–100.0)
Neutro Abs: 4 10*3/uL (ref 1.7–7.7)
Platelets: 288 10*3/uL (ref 150–400)
RBC: 3.68 MIL/uL — ABNORMAL LOW (ref 3.87–5.11)
WBC: 7.1 10*3/uL (ref 4.0–10.5)

## 2012-09-23 LAB — URINALYSIS, ROUTINE W REFLEX MICROSCOPIC
Glucose, UA: NEGATIVE mg/dL
Ketones, ur: NEGATIVE mg/dL
Leukocytes, UA: NEGATIVE
Nitrite: NEGATIVE
Specific Gravity, Urine: 1.012 (ref 1.005–1.030)
pH: 7 (ref 5.0–8.0)

## 2012-09-23 LAB — COMPREHENSIVE METABOLIC PANEL
AST: 17 U/L (ref 0–37)
Albumin: 3.7 g/dL (ref 3.5–5.2)
Alkaline Phosphatase: 27 U/L — ABNORMAL LOW (ref 39–117)
Chloride: 102 mEq/L (ref 96–112)
Creatinine, Ser: 1.39 mg/dL — ABNORMAL HIGH (ref 0.50–1.10)
Potassium: 3.9 mEq/L (ref 3.5–5.1)
Total Bilirubin: 0.1 mg/dL — ABNORMAL LOW (ref 0.3–1.2)
Total Protein: 7.4 g/dL (ref 6.0–8.3)

## 2012-09-23 MED ORDER — IOHEXOL 300 MG/ML  SOLN
50.0000 mL | Freq: Once | INTRAMUSCULAR | Status: AC | PRN
  Administered 2012-09-23: 50 mL via ORAL

## 2012-09-23 MED ORDER — IOHEXOL 300 MG/ML  SOLN: 100.0000 mL | Freq: Once | INTRAMUSCULAR | Status: DC | PRN

## 2012-09-23 MED ORDER — MAGNESIUM CITRATE PO SOLN
1.0000 | Freq: Once | ORAL | Status: AC
  Administered 2012-09-23: 1 via ORAL
  Filled 2012-09-23: qty 296

## 2012-09-23 MED ORDER — IOHEXOL 300 MG/ML  SOLN
80.0000 mL | Freq: Once | INTRAMUSCULAR | Status: AC | PRN
  Administered 2012-09-23: 80 mL via INTRAVENOUS

## 2012-09-23 NOTE — ED Notes (Signed)
EKG performed showed Dr. Fredderick Phenix, pt cleared to return back to [facility]

## 2012-09-23 NOTE — ED Notes (Signed)
ptar called for pt transportation home 

## 2012-09-23 NOTE — ED Notes (Signed)
Per EMS patient states her abdominal pain started a few days ago, has not had a BM in 4 days. Denies N/V/D

## 2012-09-23 NOTE — ED Notes (Signed)
Received report from previous [employer].

## 2012-09-23 NOTE — ED Provider Notes (Signed)
History     CSN: [id number]  Arrival date & time 09/23/12  1445   First MD Initiated Contact with Patient 09/23/12 1511      Chief Complaint  Patient presents with  . Abdominal Pain    (Consider location/radiation/quality/duration/timing/severity/associated sxs/prior treatment) HPI Comments: Patient with a history of dementia presents from an assisted living facility with complaints of constipation and abdominal pain. She states that she has not had any type of bowel movement in over one month. She has some intermittent nausea but she says this is normal for her. She states that she has intermittent abdominal pain that she describes as all over her abdomen. She also describes a burning pain in her epigastric region. She denies any known fevers. She denies any chest pain or shortness of breath. She denies any urinary symptoms. She uses Colace at home without improvement of symptoms. She is being followed by Dr. Leone Payor with GI for constipation and she did have a history of some rectal bleeding. She's currently awaiting colonoscopy which is scheduled for April.  Patient is a 79 y.o. female presenting with abdominal pain.  Abdominal Pain Associated symptoms: constipation and nausea   Associated symptoms: no chest pain, no chills, no cough, no diarrhea, no fatigue, no fever, no hematuria, no shortness of breath and no vomiting     Past Medical History  Diagnosis Date  . Coronary artery disease   . Dementia   . Hypertension   . Hyperlipidemia   . Anxiety   . GERD (gastroesophageal reflux disease)   . CHF (congestive heart failure)   . Polyp, stomach 11/11/2006  . Aphakia of left eye   . Obese   . MI (myocardial infarction)   . Sleep apnea   . Chest pain at rest 12/20/2011  . CAD (coronary artery disease), cutting balloon athrectomy of her dominant AV groove of LCX.  2007 12/20/2011  . Bradycardia 12/20/2011  . OSA on CPAP 12/20/2011  . HTN (hypertension) 12/20/2011  . Dyslipidemia   12/20/2011  . Diabetes mellitus   . Cardiomyopathy   . DJD (degenerative joint disease)   . Osteoarthritis   . History of GI bleed   . Shortness of breath   . Atrial fibrillation   . Sigmoid diverticulosis   . Nephrolithiasis     bilateral    Past Surgical History  Procedure Laterality Date  . Immature cataract      left eye  . Back surgery    . Appendectomy    . Breast surgery    . Fracture surgery    . Dilation and curettage of uterus    . Abdominal hysterectomy    . Coronary angioplasty    . Esophagogastroduodenoscopy    . Colonoscopy    . Eus    . Esophagogastroduodenoscopy  02/21/2012    Procedure: ESOPHAGOGASTRODUODENOSCOPY (EGD);  Surgeon: Iva Boop, MD;  Location: [facility];  Service: Endoscopy;  Laterality: N/A;  pt. being evaluated for a pacemaker  . Balloon dilation  02/21/2012    Procedure: BALLOON DILATION;  Surgeon: Iva Boop, MD;  Location: [facility];  Service: Endoscopy;  Laterality: N/A;    Family History  Problem Relation Age of Onset  . Colon cancer Mother   . Heart disease Mother   . Stroke Mother     History  Substance Use Topics  . Smoking status: Never Smoker   . Smokeless tobacco: Never Used  . Alcohol Use: No    OB History  Grav Para Term Preterm Abortions TAB SAB Ect Mult Living                  Review of Systems  Constitutional: Negative for fever, chills, diaphoresis and fatigue.  HENT: Negative for congestion, rhinorrhea and sneezing.   Eyes: Negative.   Respiratory: Negative for cough, chest tightness and shortness of breath.   Cardiovascular: Negative for chest pain and leg swelling.  Gastrointestinal: Positive for nausea, abdominal pain and constipation. Negative for vomiting, diarrhea and blood in stool.  Genitourinary: Negative for frequency, hematuria, flank pain and difficulty urinating.  Musculoskeletal: Negative for back pain and arthralgias.  Skin: Negative for rash.  Neurological: Negative for  dizziness, speech difficulty, weakness, numbness and headaches.    Allergies  Chicken allergy and Fish allergy  Home Medications   Current Outpatient Rx  Name  Route  Sig  Dispense  Refill  . amLODipine (NORVASC) 5 MG tablet   Oral   Take 10 mg by mouth daily before breakfast.          . aspirin EC 81 MG tablet   Oral   Take 81 mg by mouth daily.         . calcium-vitamin D (OSCAL WITH D) 500-200 MG-UNIT per tablet   Oral   Take 1 tablet by mouth daily.         . cetirizine (ZYRTEC) 10 MG tablet   Oral   Take 10 mg by mouth daily as needed.         . cholecalciferol (VITAMIN D) 1000 UNITS tablet   Oral   Take 1,000 Units by mouth daily.         . citalopram (CELEXA) 20 MG tablet   Oral   Take 20 mg by mouth daily before breakfast.          . docusate sodium (COLACE) 100 MG capsule   Oral   Take 100 mg by mouth 2 (two) times daily.         Marland Kitchen esomeprazole (NEXIUM) 40 MG capsule   Oral   Take 40 mg by mouth daily before breakfast.         . ezetimibe (ZETIA) 10 MG tablet   Oral   Take 10 mg by mouth at bedtime.         . fenofibrate (TRICOR) 48 MG tablet   Oral   Take 48 mg by mouth at bedtime.         . fluticasone (FLONASE) 50 MCG/ACT nasal spray   Nasal   Place 2 sprays into the nose daily.         Marland Kitchen guaifenesin (DIABETIC TUSSIN EX) 100 MG/5ML syrup   Oral   Take 200 mg by mouth every 4 (four) hours while awake.         . hydrALAZINE (APRESOLINE) 25 MG tablet   Oral   Take 25 mg by mouth 3 (three) times daily.         . isosorbide mononitrate (IMDUR) 30 MG 24 hr tablet   Oral   Take 30 mg by mouth daily before breakfast.          . ketorolac (ACULAR) 0.4 % SOLN   Right Eye   Place 1 drop into the right eye 2 (two) times daily.         Marland Kitchen levofloxacin (LEVAQUIN) 500 MG tablet   Oral   Take 500 mg by mouth daily.         Marland Kitchen  losartan-hydrochlorothiazide (HYZAAR) 100-25 MG per tablet   Oral   Take 1 tablet by mouth  daily before breakfast.          . metoCLOPramide (REGLAN) 5 MG tablet   Oral   Take 5 mg by mouth 4 (four) times daily -  before meals and at bedtime.          . Olopatadine HCl (PATADAY) 0.2 % SOLN   Both Eyes   Place 1 drop into both eyes daily.         Marland Kitchen oxyCODONE-acetaminophen (PERCOCET) 7.5-325 MG per tablet   Oral   Take 1 tablet by mouth 3 (three) times daily.         . polyethylene glycol (MIRALAX / GLYCOLAX) packet      Mix 1 capful in 8oz of juice/water and drink every day   14 each   3   . potassium chloride (K-DUR) 10 MEQ tablet   Oral   Take 10 mEq by mouth 2 (two) times daily. Do not crush         . rosuvastatin (CRESTOR) 5 MG tablet   Oral   Take 5 mg by mouth at bedtime.          . senna (SENOKOT) 8.6 MG TABS   Oral   Take 1 tablet by mouth 2 (two) times daily.         Marland Kitchen spironolactone (ALDACTONE) 25 MG tablet   Oral   Take 25 mg by mouth daily before breakfast.          . sucralfate (CARAFATE) 1 G tablet   Oral   Take 1 g by mouth 4 (four) times daily.         . clonazePAM (KLONOPIN) 1 MG tablet   Oral   Take 0.5 mg by mouth daily as needed. For anxiety         . hydrocortisone (ANUSOL-HC) 25 MG suppository   Rectal   Place 25 mg rectally 2 (two) times daily as needed.         . nitroGLYCERIN (NITROLINGUAL) 0.4 MG/SPRAY spray   Sublingual   Place 2 sprays under the tongue every 5 (five) minutes as needed. Use 2 sprays under tongue as needed for chest pain. Repeat 2 sprays if pain persists after 15 minutes if pain still persists         . nystatin (MYCOSTATIN/NYSTOP) 100000 UNIT/GM POWD   Topical   Apply topically 3 (three) times daily. Apply under breast bilaterally after applying Nystatin cream for yeast rash.  Once rash has resolved, continue powder twice daily.         Marland Kitchen nystatin cream (MYCOSTATIN)   Topical   Apply 1 application topically 3 (three) times daily. Apply under both breasts.           BP 187/66   Pulse 63  Temp(Src) 98.6 F (37 C) (Oral)  Resp 18  SpO2 96%  Physical Exam  Constitutional: She is oriented to person, place, and time. She appears well-developed and well-nourished.  HENT:  Head: Normocephalic and atraumatic.  Mouth/Throat: Oropharynx is clear and moist.  Eyes: Pupils are equal, round, and reactive to light.  Neck: Normal range of motion. Neck supple.  Cardiovascular: Normal rate, regular rhythm and normal heart sounds.   Pulmonary/Chest: Effort normal and breath sounds normal. No respiratory distress. She has no wheezes. She has no rales. She exhibits no tenderness.  Abdominal: Soft. Bowel sounds are normal. There is tenderness (mild diffuse  tenderness). There is no rebound and no guarding.  Genitourinary:  No hemorrhoids are noted. No impaction  Musculoskeletal: Normal range of motion. She exhibits no edema.  Lymphadenopathy:    She has no cervical adenopathy.  Neurological: She is alert and oriented to person, place, and time.  Skin: Skin is warm and dry. No rash noted.  Psychiatric: She has a normal mood and affect.    ED Course  Procedures (including critical care time)  Results for orders placed during the hospital encounter of 09/23/12  CBC WITH DIFFERENTIAL      Result Value Range   WBC 7.1  4.0 - 10.5 K/uL   RBC 3.68 (*) 3.87 - 5.11 MIL/uL   Hemoglobin 10.4 (*) 12.0 - 15.0 g/dL   HCT 40.3 (*) 47.4 - 25.9 %   MCV 85.6  78.0 - 100.0 fL   MCH 28.3  26.0 - 34.0 pg   MCHC 33.0  30.0 - 36.0 g/dL   RDW 56.3  87.5 - 64.3 %   Platelets 288  150 - 400 K/uL   Neutrophils Relative 56  43 - 77 %   Neutro Abs 4.0  1.7 - 7.7 K/uL   Lymphocytes Relative 27  12 - 46 %   Lymphs Abs 1.9  0.7 - 4.0 K/uL   Monocytes Relative 13 (*) 3 - 12 %   Monocytes Absolute 0.9  0.1 - 1.0 K/uL   Eosinophils Relative 4  0 - 5 %   Eosinophils Absolute 0.3  0.0 - 0.7 K/uL   Basophils Relative 0  0 - 1 %   Basophils Absolute 0.0  0.0 - 0.1 K/uL  COMPREHENSIVE METABOLIC  PANEL      Result Value Range   Sodium 135  135 - 145 mEq/L   Potassium 3.9  3.5 - 5.1 mEq/L   Chloride 102  96 - 112 mEq/L   CO2 24  19 - 32 mEq/L   Glucose, Bld 140 (*) 70 - 99 mg/dL   BUN 19  6 - 23 mg/dL   Creatinine, Ser 3.29 (*) 0.50 - 1.10 mg/dL   Calcium 9.8  8.4 - 51.8 mg/dL   Total Protein 7.4  6.0 - 8.3 g/dL   Albumin 3.7  3.5 - 5.2 g/dL   AST 17  0 - 37 U/L   ALT 12  0 - 35 U/L   Alkaline Phosphatase 27 (*) 39 - 117 U/L   Total Bilirubin 0.1 (*) 0.3 - 1.2 mg/dL   GFR calc non Af Amer 34 (*) >90 mL/min   GFR calc Af Amer 39 (*) >90 mL/min  LIPASE, BLOOD      Result Value Range   Lipase 36  11 - 59 U/L  URINALYSIS, ROUTINE W REFLEX MICROSCOPIC      Result Value Range   Color, Urine YELLOW  YELLOW   APPearance CLEAR  CLEAR   Specific Gravity, Urine 1.012  1.005 - 1.030   pH 7.0  5.0 - 8.0   Glucose, UA NEGATIVE  NEGATIVE mg/dL   Hgb urine dipstick NEGATIVE  NEGATIVE   Bilirubin Urine NEGATIVE  NEGATIVE   Ketones, ur NEGATIVE  NEGATIVE mg/dL   Protein, ur NEGATIVE  NEGATIVE mg/dL   Urobilinogen, UA 0.2  0.0 - 1.0 mg/dL   Nitrite NEGATIVE  NEGATIVE   Leukocytes, UA NEGATIVE  NEGATIVE   Ct Abdomen Pelvis W Contrast  09/23/2012  *RADIOLOGY REPORT*  Clinical Data: Abdominal pain for a few days, no bowel movement for  4 days, past history of hysterectomy, diabetes, GI bleeding, sigmoid diverticulosis, coronary artery disease post MI, CHF  CT ABDOMEN AND PELVIS WITH CONTRAST  Technique:  Multidetector CT imaging of the abdomen and pelvis was performed following the standard protocol during bolus administration of intravenous contrast. Sagittal and coronal MPR images reconstructed from axial data set.  Contrast: 80mL OMNIPAQUE IOHEXOL 300 MG/ML  SOLN, 50mL OMNIPAQUE IOHEXOL 300 MG/ML  SOLN  Comparison: 07/19/2012  Findings: Bibasilar atelectasis. Bilateral tiny nonobstructing renal calculi. Liver, spleen, pancreas, kidneys, and adrenal glands otherwise normal appearance.  Extensive atherosclerotic calcifications of the aorta and iliac systems. Contracted gallbladder. Distal descending and sigmoid colonic diverticulosis without diverticulitis changes. Right inguinal hernia containing nonobstructed bowel loop. Bowel loops otherwise normal appearance.  Stomach unremarkable. No mass, adenopathy, free fluid, or free air. Motion artifacts at pelvis. Uterus surgically absent with normal sized ovaries, small cyst right ovary. Unremarkable bladder and ureters. Degenerative disc disease changes at multiple levels of the thoracolumbar spine with scattered listhesis and significant spinal stenosis at L4-L5 and L5-S1  IMPRESSION: Distal colonic diverticulosis without definite evidence of diverticulitis. Extensive atherosclerotic disease. Nonobstructing renal calculi. Right inguinal hernia containing a nonobstructed small bowel loop. No definite acute abnormalities.   Original Report Authenticated By: Ulyses Southward, M.D.    Dg Abd Acute W/chest  09/23/2012  *RADIOLOGY REPORT*  Clinical Data: Abdominal pain.  Constipation.  ACUTE ABDOMEN SERIES (ABDOMEN 2 VIEW & CHEST 1 VIEW)  Comparison: Acute abdominal series and CT 07/19/2012.  Findings: The heart size and mediastinal contours are stable. Right paratracheal density is attributed to vascular structures. There are lower lung volumes with mildly increased basilar atelectasis.  There is no focal airspace disease or pleural effusion.  The bowel gas pattern is normal.  There is no free intraperitoneal air or suspicious abdominal calcification.  Aorto iliac atherosclerosis and mild lumbar spondylosis are noted.  There are mild degenerative changes of both hips.  IMPRESSION:  1.  Lower lung volumes with mild bibasilar atelectasis. 2.  No acute abdominal findings.   Original Report Authenticated By: Carey Bullocks, M.D.      Ct Abdomen Pelvis W Contrast  09/23/2012  *RADIOLOGY REPORT*  Clinical Data: Abdominal pain for a few days, no bowel movement  for 4 days, past history of hysterectomy, diabetes, GI bleeding, sigmoid diverticulosis, coronary artery disease post MI, CHF  CT ABDOMEN AND PELVIS WITH CONTRAST  Technique:  Multidetector CT imaging of the abdomen and pelvis was performed following the standard protocol during bolus administration of intravenous contrast. Sagittal and coronal MPR images reconstructed from axial data set.  Contrast: 80mL OMNIPAQUE IOHEXOL 300 MG/ML  SOLN, 50mL OMNIPAQUE IOHEXOL 300 MG/ML  SOLN  Comparison: 07/19/2012  Findings: Bibasilar atelectasis. Bilateral tiny nonobstructing renal calculi. Liver, spleen, pancreas, kidneys, and adrenal glands otherwise normal appearance. Extensive atherosclerotic calcifications of the aorta and iliac systems. Contracted gallbladder. Distal descending and sigmoid colonic diverticulosis without diverticulitis changes. Right inguinal hernia containing nonobstructed bowel loop. Bowel loops otherwise normal appearance.  Stomach unremarkable. No mass, adenopathy, free fluid, or free air. Motion artifacts at pelvis. Uterus surgically absent with normal sized ovaries, small cyst right ovary. Unremarkable bladder and ureters. Degenerative disc disease changes at multiple levels of the thoracolumbar spine with scattered listhesis and significant spinal stenosis at L4-L5 and L5-S1  IMPRESSION: Distal colonic diverticulosis without definite evidence of diverticulitis. Extensive atherosclerotic disease. Nonobstructing renal calculi. Right inguinal hernia containing a nonobstructed small bowel loop. No definite acute abnormalities.   Original Report Authenticated  By: Ulyses Southward, M.D.    Dg Abd Acute W/chest  09/23/2012  *RADIOLOGY REPORT*  Clinical Data: Abdominal pain.  Constipation.  ACUTE ABDOMEN SERIES (ABDOMEN 2 VIEW & CHEST 1 VIEW)  Comparison: Acute abdominal series and CT 07/19/2012.  Findings: The heart size and mediastinal contours are stable. Right paratracheal density is attributed to vascular  structures. There are lower lung volumes with mildly increased basilar atelectasis.  There is no focal airspace disease or pleural effusion.  The bowel gas pattern is normal.  There is no free intraperitoneal air or suspicious abdominal calcification.  Aorto iliac atherosclerosis and mild lumbar spondylosis are noted.  There are mild degenerative changes of both hips.  IMPRESSION:  1.  Lower lung volumes with mild bibasilar atelectasis. 2.  No acute abdominal findings.   Original Report Authenticated By: Carey Bullocks, M.D.     Date: 09/23/2012  Rate: 64  Rhythm: normal sinus rhythm  QRS Axis: left  Intervals: normal  ST/T Wave abnormalities: normal  Conduction Disutrbances:none  Narrative Interpretation:   Old EKG Reviewed: unchanged    1. Abdominal pain   2. Constipation   3. Inguinal hernia   4. Renal insufficiency       MDM  Patient is well-appearing. Her abdominal pain is mostly centered around her lower abdomen and right lower quadrant where she does have a review supple right inguinal hernia. She has no vomiting no fevers. Her CT scan was otherwise unremarkable. As she is being discharge she describes the abdominal pain is radiating into her chest. We did do an EKG which did not show any evidence of ischemic disease. She has no other symptoms consistent with acute coronary syndrome. She was discharged back to nursing home.        Rolan Bucco, MD 09/23/12 1949

## 2012-10-24 ENCOUNTER — Encounter (HOSPITAL_COMMUNITY)
Admission: RE | Disposition: A | Discharge status: Skilled Nursing Facility | Payer: Self-pay | Source: Ambulatory Visit | Attending: Internal Medicine

## 2012-10-24 ENCOUNTER — Encounter (HOSPITAL_COMMUNITY): Payer: Self-pay | Admitting: *Deleted

## 2012-10-24 ENCOUNTER — Ambulatory Visit (HOSPITAL_COMMUNITY)
Admission: RE | Admit: 2012-10-24 | Discharge: 2012-10-24 | Disposition: A | Discharge status: Skilled Nursing Facility | Payer: Medicare Other | Source: Ambulatory Visit | Attending: Internal Medicine | Admitting: Internal Medicine

## 2012-10-24 DIAGNOSIS — K5900 Constipation, unspecified: Secondary | ICD-10-CM

## 2012-10-24 DIAGNOSIS — I509 Heart failure, unspecified: Secondary | ICD-10-CM | POA: Insufficient documentation

## 2012-10-24 DIAGNOSIS — Z79899 Other long term (current) drug therapy: Secondary | ICD-10-CM | POA: Insufficient documentation

## 2012-10-24 DIAGNOSIS — K219 Gastro-esophageal reflux disease without esophagitis: Secondary | ICD-10-CM | POA: Insufficient documentation

## 2012-10-24 DIAGNOSIS — I428 Other cardiomyopathies: Secondary | ICD-10-CM | POA: Insufficient documentation

## 2012-10-24 DIAGNOSIS — D509 Iron deficiency anemia, unspecified: Secondary | ICD-10-CM

## 2012-10-24 DIAGNOSIS — I252 Old myocardial infarction: Secondary | ICD-10-CM | POA: Insufficient documentation

## 2012-10-24 DIAGNOSIS — K5730 Diverticulosis of large intestine without perforation or abscess without bleeding: Secondary | ICD-10-CM | POA: Insufficient documentation

## 2012-10-24 DIAGNOSIS — E669 Obesity, unspecified: Secondary | ICD-10-CM | POA: Insufficient documentation

## 2012-10-24 DIAGNOSIS — Z6829 Body mass index (BMI) 29.0-29.9, adult: Secondary | ICD-10-CM | POA: Insufficient documentation

## 2012-10-24 DIAGNOSIS — E785 Hyperlipidemia, unspecified: Secondary | ICD-10-CM | POA: Insufficient documentation

## 2012-10-24 DIAGNOSIS — G4733 Obstructive sleep apnea (adult) (pediatric): Secondary | ICD-10-CM | POA: Insufficient documentation

## 2012-10-24 DIAGNOSIS — K5909 Other constipation: Secondary | ICD-10-CM | POA: Insufficient documentation

## 2012-10-24 DIAGNOSIS — R194 Change in bowel habit: Secondary | ICD-10-CM

## 2012-10-24 DIAGNOSIS — Z7982 Long term (current) use of aspirin: Secondary | ICD-10-CM | POA: Insufficient documentation

## 2012-10-24 DIAGNOSIS — M1990 Unspecified osteoarthritis, unspecified site: Secondary | ICD-10-CM | POA: Insufficient documentation

## 2012-10-24 DIAGNOSIS — Z860100 Personal history of colon polyps, unspecified: Secondary | ICD-10-CM | POA: Diagnosis present

## 2012-10-24 DIAGNOSIS — K6389 Other specified diseases of intestine: Secondary | ICD-10-CM | POA: Insufficient documentation

## 2012-10-24 DIAGNOSIS — K5903 Drug induced constipation: Secondary | ICD-10-CM

## 2012-10-24 DIAGNOSIS — Z91018 Allergy to other foods: Secondary | ICD-10-CM | POA: Insufficient documentation

## 2012-10-24 DIAGNOSIS — K625 Hemorrhage of anus and rectum: Secondary | ICD-10-CM

## 2012-10-24 DIAGNOSIS — K644 Residual hemorrhoidal skin tags: Secondary | ICD-10-CM | POA: Insufficient documentation

## 2012-10-24 DIAGNOSIS — I10 Essential (primary) hypertension: Secondary | ICD-10-CM | POA: Insufficient documentation

## 2012-10-24 DIAGNOSIS — Z800 Family history of malignant neoplasm of digestive organs: Secondary | ICD-10-CM | POA: Insufficient documentation

## 2012-10-24 DIAGNOSIS — D126 Benign neoplasm of colon, unspecified: Secondary | ICD-10-CM

## 2012-10-24 DIAGNOSIS — E119 Type 2 diabetes mellitus without complications: Secondary | ICD-10-CM | POA: Insufficient documentation

## 2012-10-24 DIAGNOSIS — I2510 Atherosclerotic heart disease of native coronary artery without angina pectoris: Secondary | ICD-10-CM | POA: Insufficient documentation

## 2012-10-24 DIAGNOSIS — Z91013 Allergy to seafood: Secondary | ICD-10-CM | POA: Insufficient documentation

## 2012-10-24 DIAGNOSIS — T40605A Adverse effect of unspecified narcotics, initial encounter: Secondary | ICD-10-CM | POA: Insufficient documentation

## 2012-10-24 HISTORY — DX: Drug induced constipation: K59.03

## 2012-10-24 HISTORY — PX: COLONOSCOPY: SHX5424

## 2012-10-24 HISTORY — DX: Iron deficiency anemia, unspecified: D50.9

## 2012-10-24 LAB — GLUCOSE, CAPILLARY: Glucose-Capillary: 99 mg/dL (ref 70–99)

## 2012-10-24 SURGERY — COLONOSCOPY
Anesthesia: Moderate Sedation

## 2012-10-24 MED ORDER — DIPHENHYDRAMINE HCL 50 MG/ML IJ SOLN
INTRAMUSCULAR | Status: AC
  Filled 2012-10-24: qty 1

## 2012-10-24 MED ORDER — METHYLENE BLUE 1 % INJ SOLN
INTRAMUSCULAR | Status: AC
  Filled 2012-10-24: qty 10

## 2012-10-24 MED ORDER — FENTANYL CITRATE 0.05 MG/ML IJ SOLN
INTRAMUSCULAR | Status: AC
  Filled 2012-10-24: qty 2

## 2012-10-24 MED ORDER — FENTANYL CITRATE 0.05 MG/ML IJ SOLN
INTRAMUSCULAR | Status: DC | PRN
  Administered 2012-10-24 (×3): 25 ug via INTRAVENOUS

## 2012-10-24 MED ORDER — MIDAZOLAM HCL 5 MG/5ML IJ SOLN
INTRAMUSCULAR | Status: DC | PRN
  Administered 2012-10-24 (×5): 1 mg via INTRAVENOUS
  Administered 2012-10-24: 2 mg via INTRAVENOUS

## 2012-10-24 MED ORDER — MIDAZOLAM HCL 10 MG/2ML IJ SOLN
INTRAMUSCULAR | Status: AC
  Filled 2012-10-24: qty 2

## 2012-10-24 MED ORDER — LUBIPROSTONE 24 MCG PO CAPS: 24.0000 ug | ORAL_CAPSULE | Freq: Two times a day (BID) | ORAL | Status: DC

## 2012-10-24 MED ORDER — SODIUM CHLORIDE 0.9 % IV SOLN
INTRAVENOUS | Status: DC
  Administered 2012-10-24: 10:00:00 via INTRAVENOUS

## 2012-10-24 NOTE — H&P (Signed)
[facility] Consultation  Primary Care Physician:  Florentina Jenny, MD   Reason for Procedure:  Rectal bleeding, constipation/change in bowels, iron-deficiency anemia  Assessment:  same     Plan:   colonoscopy     HPI: [name] is a 79 y.o. female seen in the office in past 2 months or so because of constipation problems that have not responded well to laxatives. Worsening problems (change in bowels) and also low ferritin. She says things are about the same.   Past Medical History  Diagnosis Date  . Coronary artery disease   . Dementia   . Hypertension   . Hyperlipidemia   . Anxiety   . GERD (gastroesophageal reflux disease)   . CHF (congestive heart failure)   . Polyp, stomach 11/11/2006  . Aphakia of left eye   . Obese   . MI (myocardial infarction)   . Sleep apnea   . Chest pain at rest 12/20/2011  . CAD (coronary artery disease), cutting balloon athrectomy of her dominant AV groove of LCX.  2007 12/20/2011  . Bradycardia 12/20/2011  . OSA on CPAP 12/20/2011  . HTN (hypertension) 12/20/2011  . Dyslipidemia  12/20/2011  . Diabetes mellitus   . Cardiomyopathy   . DJD (degenerative joint disease)   . Osteoarthritis   . History of GI bleed   . Shortness of breath   . Atrial fibrillation   . Sigmoid diverticulosis   . Nephrolithiasis     bilateral    Past Surgical History  Procedure Laterality Date  . Immature cataract      left eye  . Back surgery    . Appendectomy    . Breast surgery    . Fracture surgery    . Dilation and curettage of uterus    . Abdominal hysterectomy    . Coronary angioplasty    . Esophagogastroduodenoscopy    . Colonoscopy    . Eus    . Esophagogastroduodenoscopy  02/21/2012    Procedure: ESOPHAGOGASTRODUODENOSCOPY (EGD);  Surgeon: Iva Boop, MD;  Location: [facility];  Service: Endoscopy;  Laterality: N/A;  pt. being evaluated for a pacemaker  . Balloon dilation  02/21/2012    Procedure: BALLOON DILATION;   Surgeon: Iva Boop, MD;  Location: [facility];  Service: Endoscopy;  Laterality: N/A;    Prior to Admission medications   Medication Sig Start Date End Date Taking? Authorizing Provider  amLODipine (NORVASC) 5 MG tablet Take 10 mg by mouth daily before breakfast.    Yes Historical Provider, MD  aspirin EC 81 MG tablet Take 81 mg by mouth daily.   Yes Historical Provider, MD  calcium-vitamin D (OSCAL WITH D) 500-200 MG-UNIT per tablet Take 1 tablet by mouth daily.   Yes Historical Provider, MD  cholecalciferol (VITAMIN D) 1000 UNITS tablet Take 1,000 Units by mouth daily.   Yes Historical Provider, MD  citalopram (CELEXA) 20 MG tablet Take 20 mg by mouth daily before breakfast.    Yes Historical Provider, MD  docusate sodium (COLACE) 100 MG capsule Take 100 mg by mouth 2 (two) times daily.   Yes Historical Provider, MD  esomeprazole (NEXIUM) 40 MG capsule Take 40 mg by mouth daily before breakfast.   Yes Historical Provider, MD  ezetimibe (ZETIA) 10 MG tablet Take 10 mg by mouth at bedtime.   Yes Historical Provider, MD  fenofibrate (TRICOR) 48 MG tablet Take 48 mg by mouth at bedtime.   Yes Historical Provider, MD  fluticasone (FLONASE) 50 MCG/ACT  nasal spray Place 2 sprays into the nose daily.   Yes Historical Provider, MD  hydrALAZINE (APRESOLINE) 25 MG tablet Take 25 mg by mouth 3 (three) times daily.   Yes Historical Provider, MD  isosorbide mononitrate (IMDUR) 30 MG 24 hr tablet Take 30 mg by mouth daily before breakfast.    Yes Historical Provider, MD  ketorolac (ACULAR) 0.4 % SOLN Place 1 drop into the right eye 2 (two) times daily.   Yes Historical Provider, MD  losartan-hydrochlorothiazide (HYZAAR) 100-25 MG per tablet Take 1 tablet by mouth daily before breakfast.    Yes Historical Provider, MD  metoCLOPramide (REGLAN) 5 MG tablet Take 5 mg by mouth 4 (four) times daily -  before meals and at bedtime.    Yes Historical Provider, MD  Olopatadine HCl (PATADAY) 0.2 % SOLN Place 1  drop into both eyes daily.   Yes Historical Provider, MD  oxyCODONE-acetaminophen (PERCOCET) 7.5-325 MG per tablet Take 1 tablet by mouth 3 (three) times daily.   Yes Historical Provider, MD  polyethylene glycol (MIRALAX / GLYCOLAX) packet Mix 1 capful in 8oz of juice/water and drink every day 08/22/12  Yes Iva Boop, MD  potassium chloride (K-DUR) 10 MEQ tablet Take 10 mEq by mouth 2 (two) times daily. Do not crush   Yes Historical Provider, MD  rosuvastatin (CRESTOR) 5 MG tablet Take 5 mg by mouth at bedtime.    Yes Historical Provider, MD  senna (SENOKOT) 8.6 MG TABS Take 1 tablet by mouth 2 (two) times daily.   Yes Historical Provider, MD  spironolactone (ALDACTONE) 25 MG tablet Take 25 mg by mouth daily before breakfast.    Yes Historical Provider, MD  sucralfate (CARAFATE) 1 G tablet Take 1 g by mouth 4 (four) times daily.   Yes Historical Provider, MD  cetirizine (ZYRTEC) 10 MG tablet Take 10 mg by mouth daily as needed.    Historical Provider, MD  clonazePAM (KLONOPIN) 1 MG tablet Take 0.5 mg by mouth daily as needed. For anxiety    Historical Provider, MD  guaifenesin (DIABETIC TUSSIN EX) 100 MG/5ML syrup Take 200 mg by mouth every 4 (four) hours while awake.    Historical Provider, MD  hydrocortisone (ANUSOL-HC) 25 MG suppository Place 25 mg rectally 2 (two) times daily as needed.    Historical Provider, MD  nitroGLYCERIN (NITROLINGUAL) 0.4 MG/SPRAY spray Place 2 sprays under the tongue every 5 (five) minutes as needed. Use 2 sprays under tongue as needed for chest pain. Repeat 2 sprays if pain persists after 15 minutes if pain still persists    Historical Provider, MD  nystatin (MYCOSTATIN/NYSTOP) 100000 UNIT/GM POWD Apply topically 3 (three) times daily. Apply under breast bilaterally after applying Nystatin cream for yeast rash.  Once rash has resolved, continue powder twice daily.    Historical Provider, MD  nystatin cream (MYCOSTATIN) Apply 1 application topically 3 (three) times  daily. Apply under both breasts.    Historical Provider, MD    Current Facility-Administered Medications  Medication Dose Route Frequency Provider Last Rate Last Dose  . 0.9 %  sodium chloride infusion   Intravenous Continuous Iva Boop, MD        Allergies as of 08/22/2012 - Review Complete 08/22/2012  Allergen Reaction Noted  . Chicken allergy  12/20/2011  . Fish allergy  12/20/2011    Family History  Problem Relation Age of Onset  . Colon cancer Mother   . Heart disease Mother   . Stroke Mother  History   Social History  . Marital Status: Widowed    Spouse Name: N/A    Number of Children: 0  . Years of Education: N/A   Occupational History  . retired   Social History Main Topics  . Smoking status: Never Smoker   . Smokeless tobacco: Never Used  . Alcohol Use: No  . Drug Use: No  . 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 She raised many of her nieces and nephews, she also has a sister in [place]. Never smoker never alcohol.    Review of Systems: Positive for decreased mobility   Physical Exam: Vital signs in last 24 hours: Temp:  [98.6 F (37 C)] 98.6 F (37 C) (04/29 0849) Pulse Rate:  [68] 68 (04/29 0849) Resp:  [12] 12 (04/29 0849) BP: (187)/(72) 187/72 mmHg (04/29 0849) SpO2:  [96 %] 96 % (04/29 0849) Weight:  [165 lb (74.844 kg)] 165 lb (74.844 kg) (04/29 0849)   General:   Alert,  Well-developed, well-nourished, pleasant and cooperative in NAD Mouth:  No deformity or lesions.  Oropharynx pink & moist. Lungs:  Clear throughout to auscultation.   Heart:  Regular rate and rhythm; no murmurs, clicks, rubs,  or gallops. Abdomen:  Soft, nontender and nondistended.Normal bowel sounds, without guarding, and without rebound.  + surgical scars Extremities:  Without clubbing or edema. Neurologic:  Alert and  oriented x 3 Skin:  Intact without significant lesions or  rashes. Psych:  Alert and cooperative. Normal mood and affect.       LOS: 0 days      @Carl  Sena Slate, MD, [facility] [phone] (pager) 10/24/2012 10:03 AM@

## 2012-10-24 NOTE — Op Note (Signed)
[facility] [address] [place], [address]   COLONOSCOPY PROCEDURE REPORT  PATIENT: [name], [name]  MR#: [id number] BIRTHDATE: September 29, 1926 , 86  yrs. old GENDER: Female ENDOSCOPIST: Iva Boop, MD, [facility] PROCEDURE DATE:  10/24/2012 PROCEDURE:   Colonoscopy with biopsy ASA CLASS:   Class III INDICATIONS:Rectal Bleeding, Iron Deficiency Anemia, and Constipation. MEDICATIONS: Fentanyl 75 mcg IV and Versed 7 mg IV  DESCRIPTION OF PROCEDURE:   After the risks benefits and alternatives of the procedure were thoroughly explained, informed consent was obtained.  A digital rectal exam revealed no abnormalities of the rectum.   The Pentax Ped Colon [id number] endoscope was introduced through the anus and advanced to the cecum, which was identified by both the appendix and ileocecal valve. No adverse events experienced.   The quality of the prep was Suprep good  The instrument was then slowly withdrawn as the colon was fully examined.      COLON FINDINGS: Three diminutive polypoid shaped sessile polyps were found in the ascending colon and transverse colon.  A polypectomy was performed with cold forceps.  The resection was complete and the polyp tissue was completely retrieved.   Moderate diverticulosis was noted.   Mild melanosis was found throughout the entire examined colon.   Small external hemorrhoids were found. The colon mucosa was otherwise normal.  Retroflexed views revealed external hemorrhoids. The time to cecum=6 minutes 0 seconds. Withdrawal time=9 minutes 0 seconds.  The scope was withdrawn and the procedure completed. COMPLICATIONS: There were no complications.  ENDOSCOPIC IMPRESSION: 1.   Three diminutive sessile polyps were found in the ascending colon and transverse colon; polypectomy was performed with cold forceps 2.   Moderate diverticulosis was noted 3.   Mild melanosis was found throughout the entire examined colon 4.   Small external  hemorrhoids 5.   The colon mucosa was otherwise normal - good prep  RECOMMENDATIONS: Amitiza 24 mcg bid for opioid-induced constipation also consider dc sucralfate, Tricor - per PCP F/U me prn - will not need routine colonoscopy at her age  eSigned:  Iva Boop, MD, [facility] 10/24/2012 11:18 AM   cc: Florentina Jenny, MD and The Patient

## 2012-10-25 ENCOUNTER — Encounter (HOSPITAL_COMMUNITY): Payer: Self-pay | Admitting: Internal Medicine

## 2012-10-26 ENCOUNTER — Encounter: Payer: Self-pay | Admitting: Internal Medicine

## 2012-10-26 NOTE — Progress Notes (Signed)
Quick Note:  3 adenomas - no recall due to age ______

## 2012-11-07 ENCOUNTER — Encounter: Payer: Self-pay | Admitting: Cardiovascular Disease

## 2012-11-07 ENCOUNTER — Encounter: Payer: Self-pay | Admitting: Internal Medicine

## 2012-11-09 ENCOUNTER — Encounter: Payer: Self-pay | Admitting: Internal Medicine

## 2012-11-09 NOTE — Progress Notes (Signed)
Patient ID: [name], female   DOB: 1927-03-15, 79 y.o.   MRN: [id number] Faxed verification of Medicaid Transportation to # [phone] for the ride she received on 10/24/12 to [facility].

## 2012-11-11 ENCOUNTER — Encounter: Payer: Self-pay | Admitting: Cardiovascular Disease

## 2012-11-16 ENCOUNTER — Ambulatory Visit (INDEPENDENT_AMBULATORY_CARE_PROVIDER_SITE_OTHER): Payer: Medicare Other | Admitting: Cardiology

## 2012-11-16 ENCOUNTER — Encounter: Payer: Self-pay | Admitting: Cardiology

## 2012-11-16 ENCOUNTER — Encounter: Payer: Self-pay | Admitting: Cardiovascular Disease

## 2012-11-16 VITALS — BP 144/64 | HR 60 | Ht 64.0 in | Wt 175.4 lb

## 2012-11-16 DIAGNOSIS — R079 Chest pain, unspecified: Secondary | ICD-10-CM | POA: Insufficient documentation

## 2012-11-16 DIAGNOSIS — E785 Hyperlipidemia, unspecified: Secondary | ICD-10-CM

## 2012-11-16 DIAGNOSIS — E119 Type 2 diabetes mellitus without complications: Secondary | ICD-10-CM

## 2012-11-16 DIAGNOSIS — I10 Essential (primary) hypertension: Secondary | ICD-10-CM

## 2012-11-16 DIAGNOSIS — I2510 Atherosclerotic heart disease of native coronary artery without angina pectoris: Secondary | ICD-10-CM

## 2012-11-16 DIAGNOSIS — G4733 Obstructive sleep apnea (adult) (pediatric): Secondary | ICD-10-CM

## 2012-11-16 MED ORDER — ISOSORBIDE MONONITRATE ER 60 MG PO TB24: 60.0000 mg | ORAL_TABLET | Freq: Every day | ORAL | Status: AC

## 2012-11-16 NOTE — Patient Instructions (Addendum)
Lexiscan Myoview will be scheduled. (Stress Test)  Increase Imdur to 60mg  daily

## 2012-11-16 NOTE — Assessment & Plan Note (Signed)
stable °

## 2012-11-16 NOTE — Assessment & Plan Note (Signed)
Last lipids 09/2012 with LDL on 29

## 2012-11-16 NOTE — Assessment & Plan Note (Signed)
Stable

## 2012-11-16 NOTE — Progress Notes (Signed)
[facility]  11/16/2012   PCP: Florentina Jenny, MD   Chief Complaint  Patient presents with  . 2 month    states chest hurts all the time and SOB    Primary Cardiologist: Dr. Allyson Sabal  HPI:79 year old lady with a history of CAD with cutting balloon atherectomy of her dominant AV groove LCX, 02/2006. Last  Cardiac cath was done 11/2011 which revealed noncritical CAD and normal LV function.  She lives in a assisted NH- [address]  At times she has been bradycardic but this resolved with medication adjustments.    Now presenting with intermittent chest pain relieved with NTG. Associated symptoms are nausea.  No SOB.  Occurs a couple of times a week.  Currently no pain.   Allergies  Allergen Reactions  . Chicken Allergy     unknown  . Fish Allergy     unknown    Current Outpatient Prescriptions  Medication Sig Dispense Refill  . amLODipine (NORVASC) 5 MG tablet Take 10 mg by mouth daily before breakfast.       . aspirin EC 81 MG tablet Take 81 mg by mouth daily.      . calcium-vitamin D (OSCAL WITH D) 500-200 MG-UNIT per tablet Take 1 tablet by mouth daily.      . cephALEXin (KEFLEX) 500 MG capsule Take 500 mg by mouth 2 (two) times daily.      . cetirizine (ZYRTEC) 10 MG tablet Take 10 mg by mouth daily as needed.      . cholecalciferol (VITAMIN D) 1000 UNITS tablet Take 1,000 Units by mouth daily.      . citalopram (CELEXA) 20 MG tablet Take 20 mg by mouth daily before breakfast.       . clonazePAM (KLONOPIN) 1 MG tablet Take 0.5 mg by mouth daily as needed. For anxiety      . docusate sodium (COLACE) 100 MG capsule Take 100 mg by mouth 2 (two) times daily.      Marland Kitchen esomeprazole (NEXIUM) 40 MG capsule Take 40 mg by mouth daily before breakfast.      . ezetimibe (ZETIA) 10 MG tablet Take 10 mg by mouth at bedtime.      . fluticasone (FLONASE) 50 MCG/ACT nasal spray Place 2 sprays into the nose daily.      . fluticasone (VERAMYST) 27.5 MCG/SPRAY nasal  spray Place 2 sprays into the nose daily.      Marland Kitchen guaifenesin (DIABETIC TUSSIN EX) 100 MG/5ML syrup Take 200 mg by mouth every 4 (four) hours while awake.      . hydrALAZINE (APRESOLINE) 25 MG tablet Take 25 mg by mouth 3 (three) times daily.      . hydrocortisone (ANUSOL-HC) 25 MG suppository Place 25 mg rectally 2 (two) times daily as needed.      . isosorbide mononitrate (IMDUR) 60 MG 24 hr tablet Take 1 tablet (60 mg total) by mouth daily before breakfast.  30 tablet  6  . ketorolac (ACULAR) 0.4 % SOLN Place 1 drop into the right eye 2 (two) times daily.      Marland Kitchen losartan-hydrochlorothiazide (HYZAAR) 100-25 MG per tablet Take 1 tablet by mouth daily before breakfast.       . lubiprostone (AMITIZA) 24 MCG capsule Take 1 capsule (24 mcg total) by mouth 2 (two) times daily with a meal.  60 capsule  11  . metoCLOPramide (REGLAN) 5 MG tablet Take 5 mg by mouth 4 (four) times daily -  before meals and at bedtime.       . nitroGLYCERIN (NITROLINGUAL) 0.4 MG/SPRAY spray Place 2 sprays under the tongue every 5 (five) minutes as needed. Use 2 sprays under tongue as needed for chest pain. Repeat 2 sprays if pain persists after 15 minutes if pain still persists      . nystatin (MYCOSTATIN/NYSTOP) 100000 UNIT/GM POWD Apply topically 3 (three) times daily. Apply under breast bilaterally after applying Nystatin cream for yeast rash.  Once rash has resolved, continue powder twice daily.      Marland Kitchen nystatin cream (MYCOSTATIN) Apply 1 application topically 3 (three) times daily. Apply under both breasts.      . Olopatadine HCl (PATADAY) 0.2 % SOLN Place 1 drop into both eyes daily.      Marland Kitchen oxyCODONE-acetaminophen (PERCOCET) 7.5-325 MG per tablet Take 1 tablet by mouth 3 (three) times daily.      . polyethylene glycol (MIRALAX / GLYCOLAX) packet Mix 1 capful in 8oz of juice/water and drink every day  14 each  3  . potassium chloride (K-DUR) 10 MEQ tablet Take 10 mEq by mouth 2 (two) times daily. Do not crush      .  rosuvastatin (CRESTOR) 5 MG tablet Take 5 mg by mouth at bedtime.       Marland Kitchen spironolactone (ALDACTONE) 25 MG tablet Take 25 mg by mouth daily before breakfast.       . fenofibrate (TRICOR) 48 MG tablet Take 48 mg by mouth at bedtime.      . senna (SENOKOT) 8.6 MG TABS Take 1 tablet by mouth 2 (two) times daily.      . sucralfate (CARAFATE) 1 G tablet Take 1 g by mouth 4 (four) times daily.       No current facility-administered medications for this visit.    Past Medical History  Diagnosis Date  . Coronary artery disease   . Dementia   . Hypertension   . Hyperlipidemia   . Anxiety   . GERD (gastroesophageal reflux disease)   . CHF (congestive heart failure)   . Polyp, stomach 11/11/2006  . Aphakia of left eye   . Obese   . MI (myocardial infarction)   . Sleep apnea   . Chest pain at rest 12/20/2011  . CAD (coronary artery disease), cutting balloon athrectomy of her dominant AV groove of LCX.  2007 12/20/2011  . Bradycardia 12/20/2011  . OSA on CPAP 12/20/2011  . HTN (hypertension) 12/20/2011  . Dyslipidemia  12/20/2011  . Diabetes mellitus   . Cardiomyopathy   . DJD (degenerative joint disease)   . Osteoarthritis   . History of GI bleed   . Shortness of breath   . Atrial fibrillation   . Sigmoid diverticulosis   . Nephrolithiasis     bilateral  . Anemia, iron deficiency 10/24/2012  . Constipation due to pain medication 10/24/2012    Past Surgical History  Procedure Laterality Date  . Immature cataract      left eye  . Back surgery    . Appendectomy    . Breast surgery    . Fracture surgery    . Dilation and curettage of uterus    . Abdominal hysterectomy    . Coronary angioplasty    . Esophagogastroduodenoscopy    . Colonoscopy    . Eus    . Esophagogastroduodenoscopy  02/21/2012    Procedure: ESOPHAGOGASTRODUODENOSCOPY (EGD);  Surgeon: Iva Boop, MD;  Location: [facility];  Service: Endoscopy;  Laterality: N/A;  pt. being evaluated for a pacemaker  . Balloon  dilation  02/21/2012    Procedure: BALLOON DILATION;  Surgeon: Iva Boop, MD;  Location: [facility];  Service: Endoscopy;  Laterality: N/A;  . Colonoscopy N/A 10/24/2012    Procedure: COLONOSCOPY;  Surgeon: Iva Boop, MD;  Location: [facility];  Service: Endoscopy;  Laterality: N/A;  . Coronary angioplasty  03-07-2004    cutting balloon  . Cardiac catheterization  06/05/2004    high grade disease AV groove CX  . Cardiac catheterization  04/19/2005    noncritical CAD  . US echocardiography  01/25/2008    mild DUST,mild MR,mod. ca+ mitral annular,AOV mildly sclerotid  . Lexiscan mycardial perfusion scan  01/25/2008    Normal    WUJ:WJXBJYN:+ allergy symptoms, no  fevers, no weight changes Skin:no rashes or ulcers HEENT:no blurred vision, no congestion CV:see HPI PUL:see HPI GI:no diarrhea always with constipation no melena, no indigestion GU:no hematuria, no dysuria MS:no joint pain, no claudication, walked in with walker today Neuro:no syncope, no lightheadedness Endo:+ diabetes, no thyroid disease  PHYSICAL EXAM BP 144/64  Pulse 60  Ht 5\' 4"  (1.626 m)  Wt 175 lb 6.4 oz (79.561 kg)  BMI 30.09 kg/m2 General:Pleasant affect, NAD Skin:Warm and dry, brisk capillary refill HEENT:normocephalic, sclera clear, mucus membranes moist Neck:supple, no JVD, no bruits  Heart:S1S2 RRR without murmur, gallup, rub or click Lungs:clear without rales, rhonchi, or wheezes WGN:FAOZ, non tender, + BS, do not palpate liver spleen or masses Ext:no lower ext edema, 2+ pedal pulses, 2+ radial pulses Neuro:alert and oriented, MAE, follows commands, + facial symmetry  EKG:SR no acute changes from previous EKGs.  ASSESSMENT AND PLAN Chest pain New episodes of chest pain, relief with NTG spray, with hx of prior arthrectomy we will do lexiscan  myoview to evaluate for ischemia.  Will increase Imdur to 60 mg daily.  CAD (coronary artery disease), cutting balloon athrectomy of her dominant AV  groove of LCX.  2007 Last cath 11/2011 was with patent atherectomy site and non obstructive CAD.  No acute EKG changes.   DM (diabetes mellitus) stable  OSA on CPAP Stable   HTN (hypertension) Stable, but with room to increase Imdur  Dyslipidemia  Last lipids 09/2012 with LDL on 29

## 2012-11-16 NOTE — Assessment & Plan Note (Signed)
Stable, but with room to increase Imdur

## 2012-11-16 NOTE — Assessment & Plan Note (Signed)
Last cath 11/2011 was with patent atherectomy site and non obstructive CAD.  No acute EKG changes.

## 2012-11-16 NOTE — Assessment & Plan Note (Signed)
New episodes of chest pain, relief with NTG spray, with hx of prior arthrectomy we will do lexiscan  myoview to evaluate for ischemia.  Will increase Imdur to 60 mg daily.

## 2012-11-17 ENCOUNTER — Encounter: Payer: Self-pay | Admitting: Cardiovascular Disease

## 2012-11-30 ENCOUNTER — Ambulatory Visit (HOSPITAL_COMMUNITY)
Admission: RE | Admit: 2012-11-30 | Discharge: 2012-11-30 | Disposition: A | Discharge status: Home / Self Care | Payer: Medicare Other | Source: Ambulatory Visit | Attending: Cardiovascular Disease | Admitting: Cardiovascular Disease

## 2012-11-30 DIAGNOSIS — R002 Palpitations: Secondary | ICD-10-CM | POA: Insufficient documentation

## 2012-11-30 DIAGNOSIS — R42 Dizziness and giddiness: Secondary | ICD-10-CM | POA: Insufficient documentation

## 2012-11-30 DIAGNOSIS — R9431 Abnormal electrocardiogram [ECG] [EKG]: Secondary | ICD-10-CM | POA: Insufficient documentation

## 2012-11-30 DIAGNOSIS — R0609 Other forms of dyspnea: Secondary | ICD-10-CM | POA: Insufficient documentation

## 2012-11-30 DIAGNOSIS — R5381 Other malaise: Secondary | ICD-10-CM | POA: Insufficient documentation

## 2012-11-30 DIAGNOSIS — R0602 Shortness of breath: Secondary | ICD-10-CM | POA: Insufficient documentation

## 2012-11-30 DIAGNOSIS — I252 Old myocardial infarction: Secondary | ICD-10-CM | POA: Insufficient documentation

## 2012-11-30 DIAGNOSIS — R0989 Other specified symptoms and signs involving the circulatory and respiratory systems: Secondary | ICD-10-CM | POA: Insufficient documentation

## 2012-11-30 DIAGNOSIS — R079 Chest pain, unspecified: Secondary | ICD-10-CM

## 2012-11-30 MED ORDER — TECHNETIUM TC 99M SESTAMIBI GENERIC - CARDIOLITE
10.3000 | Freq: Once | INTRAVENOUS | Status: AC | PRN
  Administered 2012-11-30: 10 via INTRAVENOUS

## 2012-11-30 MED ORDER — AMINOPHYLLINE 25 MG/ML IV SOLN
75.0000 mg | Freq: Once | INTRAVENOUS | Status: AC
  Administered 2012-11-30: 75 mg via INTRAVENOUS

## 2012-11-30 MED ORDER — TECHNETIUM TC 99M SESTAMIBI GENERIC - CARDIOLITE
29.0000 | Freq: Once | INTRAVENOUS | Status: AC | PRN
  Administered 2012-11-30: 29 via INTRAVENOUS

## 2012-11-30 MED ORDER — REGADENOSON 0.4 MG/5ML IV SOLN
0.4000 mg | Freq: Once | INTRAVENOUS | Status: AC
  Administered 2012-11-30: 0.4 mg via INTRAVENOUS

## 2012-11-30 NOTE — Procedures (Addendum)
[facility] [address] [place] [address] [place] [address] [phone]  Cardiology Nuclear Med Study  [name] is a 79 y.o. female     MRN : [id number]     DOB: 06/22/1927  Procedure Date: 11/30/2012  Nuclear Med Background Indication for Stress Test:  PTCA Patency and Abnormal EKG History:  CAD;MI;PTCA--12/20/2011;CHF;CARDIOMYOPATHY Cardiac Risk Factors: Family History - CAD, Hypertension, Lipids, NIDDM and Obesity  Symptoms:  Chest Pain, Dizziness, DOE, Fatigue, Palpitations and SOB   Nuclear Pre-Procedure Caffeine/Decaff Intake:  10:00pm NPO After: 8:00am   IV Site: R Antecubital  IV 0.9% NS with Angio Cath:  22g  Chest Size (in):  N/A IV Started by: Emmit Pomfret, RN  Height: 5\' 6"  (1.676 m)  Cup Size: D  BMI:  Body mass index is 28.42 kg/(m^2). Weight:  176 lb (79.833 kg)   Tech Comments:  N/A    Nuclear Med Study 1 or 2 day study: 1 day  Stress Test Type:  Lexiscan  Order Authorizing Provider:  Nanetta Batty, MD   Resting Radionuclide: Technetium 89m Sestamibi  Resting Radionuclide Dose: 10.3 mCi   Stress Radionuclide:  Technetium 105m Sestamibi  Stress Radionuclide Dose: 29.0 mCi           Stress Protocol Rest HR: 56 Stress HR: 84  Rest BP: 167/64 Stress BP: 192/57  Exercise Time (min): n/a METS: n/a   Predicted Max HR: 136 bpm % Max HR: 61.76 bpm Rate Pressure Product: 13244  Dose of Adenosine (mg):  n/a Dose of Lexiscan: 0.4 mg  Dose of Atropine (mg): n/a Dose of Dobutamine: n/a mcg/kg/min (at max HR)  Stress Test Technologist: Esperanza Sheets, CCT Nuclear Technologist: Gonzella Lex, CNMT   Rest Procedure:  Myocardial perfusion imaging was performed at rest 45 minutes following the intravenous administration of Technetium 87m Sestamibi. Stress Procedure:  The patient received IV Lexiscan 0.4 mg over 15-seconds.  Technetium 6m Sestamibi injected at 30-seconds.  The patient developed a headache at 3:30 min in  recovery and was given 75 mg of IV Aminophylline with resolution of symptom(s).  There were no significant changes with Lexiscan.  Quantitative spect images were obtained after a 45 minute delay.  Transient Ischemic Dilatation (Normal <1.22):  1.03 Lung/Heart Ratio (Normal <0.45):  0.23 QGS EDV:  77 ml QGS ESV:  15 ml LV Ejection Fraction: 80%  Signed by .      Rest ECG: NSR - Normal EKG  Stress ECG: No significant change from baseline ECG  QPS Raw Data Images:  Normal; no motion artifact; normal heart/lung ratio. Stress Images:  Normal homogeneous uptake in all areas of the myocardium. Rest Images:  Normal homogeneous uptake in all areas of the myocardium. Subtraction (SDS):  No evidence of ischemia.  Impression Exercise Capacity:  Lexiscan with no exercise. BP Response:  Normal blood pressure response. Clinical Symptoms:  No significant symptoms noted. ECG Impression:  No significant ST segment change suggestive of ischemia. Comparison with Prior Nuclear Study: No significant change from previous study  Overall Impression:  Normal stress nuclear study.  LV Wall Motion:  NL LV Function; NL Wall Motion   Runell Gess, MD  11/30/2012 6:02 PM

## 2012-12-31 ENCOUNTER — Emergency Department (HOSPITAL_COMMUNITY)
Admission: EM | Admit: 2012-12-31 | Discharge: 2013-01-01 | Disposition: A | Discharge status: Home / Self Care | Payer: Medicare Other | Attending: Emergency Medicine | Admitting: Emergency Medicine

## 2012-12-31 ENCOUNTER — Emergency Department (HOSPITAL_COMMUNITY): Payer: Medicare Other

## 2012-12-31 ENCOUNTER — Encounter (HOSPITAL_COMMUNITY): Payer: Self-pay | Admitting: Emergency Medicine

## 2012-12-31 DIAGNOSIS — Z87442 Personal history of urinary calculi: Secondary | ICD-10-CM | POA: Insufficient documentation

## 2012-12-31 DIAGNOSIS — IMO0002 Reserved for concepts with insufficient information to code with codable children: Secondary | ICD-10-CM | POA: Insufficient documentation

## 2012-12-31 DIAGNOSIS — K5900 Constipation, unspecified: Secondary | ICD-10-CM | POA: Insufficient documentation

## 2012-12-31 DIAGNOSIS — I252 Old myocardial infarction: Secondary | ICD-10-CM | POA: Insufficient documentation

## 2012-12-31 DIAGNOSIS — Z79899 Other long term (current) drug therapy: Secondary | ICD-10-CM | POA: Insufficient documentation

## 2012-12-31 DIAGNOSIS — I10 Essential (primary) hypertension: Secondary | ICD-10-CM | POA: Insufficient documentation

## 2012-12-31 DIAGNOSIS — E785 Hyperlipidemia, unspecified: Secondary | ICD-10-CM | POA: Insufficient documentation

## 2012-12-31 DIAGNOSIS — Z7982 Long term (current) use of aspirin: Secondary | ICD-10-CM | POA: Insufficient documentation

## 2012-12-31 DIAGNOSIS — F0390 Unspecified dementia without behavioral disturbance: Secondary | ICD-10-CM | POA: Insufficient documentation

## 2012-12-31 DIAGNOSIS — Z9861 Coronary angioplasty status: Secondary | ICD-10-CM | POA: Insufficient documentation

## 2012-12-31 DIAGNOSIS — Z8719 Personal history of other diseases of the digestive system: Secondary | ICD-10-CM | POA: Insufficient documentation

## 2012-12-31 DIAGNOSIS — K219 Gastro-esophageal reflux disease without esophagitis: Secondary | ICD-10-CM | POA: Insufficient documentation

## 2012-12-31 DIAGNOSIS — Z8679 Personal history of other diseases of the circulatory system: Secondary | ICD-10-CM | POA: Insufficient documentation

## 2012-12-31 DIAGNOSIS — F411 Generalized anxiety disorder: Secondary | ICD-10-CM | POA: Insufficient documentation

## 2012-12-31 DIAGNOSIS — G4733 Obstructive sleep apnea (adult) (pediatric): Secondary | ICD-10-CM | POA: Insufficient documentation

## 2012-12-31 DIAGNOSIS — E119 Type 2 diabetes mellitus without complications: Secondary | ICD-10-CM | POA: Insufficient documentation

## 2012-12-31 DIAGNOSIS — Z8669 Personal history of other diseases of the nervous system and sense organs: Secondary | ICD-10-CM | POA: Insufficient documentation

## 2012-12-31 DIAGNOSIS — I2510 Atherosclerotic heart disease of native coronary artery without angina pectoris: Secondary | ICD-10-CM | POA: Insufficient documentation

## 2012-12-31 DIAGNOSIS — D509 Iron deficiency anemia, unspecified: Secondary | ICD-10-CM | POA: Insufficient documentation

## 2012-12-31 DIAGNOSIS — M1990 Unspecified osteoarthritis, unspecified site: Secondary | ICD-10-CM | POA: Insufficient documentation

## 2012-12-31 DIAGNOSIS — E669 Obesity, unspecified: Secondary | ICD-10-CM | POA: Insufficient documentation

## 2012-12-31 DIAGNOSIS — I509 Heart failure, unspecified: Secondary | ICD-10-CM | POA: Insufficient documentation

## 2012-12-31 MED ORDER — MAGNESIUM CITRATE PO SOLN
1.0000 | Freq: Once | ORAL | Status: DC
  Filled 2012-12-31: qty 296

## 2012-12-31 MED ORDER — BISACODYL 10 MG RE SUPP
10.0000 mg | Freq: Once | RECTAL | Status: AC
Start: 2012-12-31 — End: 2012-12-31
  Administered 2012-12-31: 10 mg via RECTAL
  Filled 2012-12-31: qty 1

## 2012-12-31 NOTE — ED Notes (Signed)
Bed:WA21<BR> Expected date:<BR> Expected time:<BR> Means of arrival:<BR> Comments:<BR> EMS

## 2012-12-31 NOTE — ED Notes (Signed)
Pt transported from [facility] with c/o constipation x 1 month. A & O, PWD

## 2012-12-31 NOTE — ED Notes (Signed)
Pt states she has not had a BM x 1 month. Pt states she feels as if she needs to go but cannot. Pt c/o rectal pain. Pt A & O. Pt reports small amount of bright red rectal bleeding when attempting to have BM. Pt states the liquid she drinks from the bottle has helped constipation in past.

## 2012-12-31 NOTE — ED Provider Notes (Signed)
History    CSN: [id number] Arrival date & time 12/31/12  1951  First MD Initiated Contact with Patient 12/31/12 1954     Chief Complaint  Patient presents with  . Constipation   (Consider location/radiation/quality/duration/timing/severity/associated sxs/prior Treatment) HPI Comments: Patient states she has not had a good BM in about a month reports a small hard single stool ball several weeks ago. Lives in a nursing home and takes Miralex regularly, was give 1 dose of MOM a week ago Now states she feels less hungry but no nausea or vomiting   Patient is a 79 y.o. female presenting with constipation. The history is provided by the patient.  Constipation Timing:  Constant Progression:  Worsening Chronicity:  Recurrent Context: not dehydration and not dietary changes   Stool description:  Hard and small Relieved by:  Miralax and laxatives Ineffective treatments:  Miralax, stool softeners and laxatives Associated symptoms: no abdominal pain, no diarrhea, no dysuria, no fever, no nausea, no urinary retention and no vomiting   Risk factors: hx of abdominal surgery    Past Medical History  Diagnosis Date  . Coronary artery disease   . Dementia   . Hypertension   . Hyperlipidemia   . Anxiety   . GERD (gastroesophageal reflux disease)   . CHF (congestive heart failure)   . Polyp, stomach 11/11/2006  . Aphakia of left eye   . Obese   . MI (myocardial infarction)   . Sleep apnea   . Chest pain at rest 12/20/2011  . CAD (coronary artery disease), cutting balloon athrectomy of her dominant AV groove of LCX.  2007 12/20/2011  . Bradycardia 12/20/2011  . OSA on CPAP 12/20/2011  . HTN (hypertension) 12/20/2011  . Dyslipidemia  12/20/2011  . Diabetes mellitus   . Cardiomyopathy   . DJD (degenerative joint disease)   . Osteoarthritis   . History of GI bleed   . Shortness of breath   . Atrial fibrillation   . Sigmoid diverticulosis   . Nephrolithiasis     bilateral  . Anemia, iron  deficiency 10/24/2012  . Constipation due to pain medication 10/24/2012   Past Surgical History  Procedure Laterality Date  . Immature cataract      left eye  . Back surgery    . Appendectomy    . Breast surgery    . Fracture surgery    . Dilation and curettage of uterus    . Abdominal hysterectomy    . Coronary angioplasty    . Esophagogastroduodenoscopy    . Colonoscopy    . Eus    . Esophagogastroduodenoscopy  02/21/2012    Procedure: ESOPHAGOGASTRODUODENOSCOPY (EGD);  Surgeon: Iva Boop, MD;  Location: [facility];  Service: Endoscopy;  Laterality: N/A;  pt. being evaluated for a pacemaker  . Balloon dilation  02/21/2012    Procedure: BALLOON DILATION;  Surgeon: Iva Boop, MD;  Location: [facility];  Service: Endoscopy;  Laterality: N/A;  . Colonoscopy N/A 10/24/2012    Procedure: COLONOSCOPY;  Surgeon: Iva Boop, MD;  Location: [facility];  Service: Endoscopy;  Laterality: N/A;  . Coronary angioplasty  03-07-2004    cutting balloon  . Cardiac catheterization  06/05/2004    high grade disease AV groove CX  . Cardiac catheterization  04/19/2005    noncritical CAD  . US echocardiography  01/25/2008    mild DUST,mild MR,mod. ca+ mitral annular,AOV mildly sclerotid  . Lexiscan mycardial perfusion scan  01/25/2008    Normal  Family History  Problem Relation Age of Onset  . Colon cancer Mother   . Heart disease Mother   . Stroke Mother    History  Substance Use Topics  . Smoking status: Never Smoker   . Smokeless tobacco: Never Used  . Alcohol Use: No   OB History   Grav Para Term Preterm Abortions TAB SAB Ect Mult Living                 Review of Systems  Constitutional: Positive for appetite change. Negative for fever and chills.  Gastrointestinal: Positive for constipation. Negative for nausea, vomiting, abdominal pain and diarrhea.  Genitourinary: Negative for dysuria.  All other systems reviewed and are negative.    Allergies  Chicken allergy  and Fish allergy  Home Medications   Current Outpatient Rx  Name  Route  Sig  Dispense  Refill  . amLODipine (NORVASC) 5 MG tablet   Oral   Take 10 mg by mouth daily before breakfast.          . aspirin EC 81 MG tablet   Oral   Take 81 mg by mouth daily.         . calcium-vitamin D (OSCAL WITH D) 500-200 MG-UNIT per tablet   Oral   Take 1 tablet by mouth daily.         . cetirizine (ZYRTEC) 10 MG tablet   Oral   Take 10 mg by mouth daily as needed.         . cholecalciferol (VITAMIN D) 1000 UNITS tablet   Oral   Take 1,000 Units by mouth daily.         . citalopram (CELEXA) 20 MG tablet   Oral   Take 20 mg by mouth daily before breakfast.          . clonazePAM (KLONOPIN) 1 MG tablet   Oral   Take 0.5 mg by mouth daily as needed. For anxiety         . docusate sodium (COLACE) 100 MG capsule   Oral   Take 100 mg by mouth 2 (two) times daily.         Marland Kitchen esomeprazole (NEXIUM) 40 MG capsule   Oral   Take 40 mg by mouth daily before breakfast.         . ezetimibe (ZETIA) 10 MG tablet   Oral   Take 10 mg by mouth at bedtime.         . fluticasone (FLONASE) 50 MCG/ACT nasal spray   Nasal   Place 2 sprays into the nose daily.         . hydrALAZINE (APRESOLINE) 25 MG tablet   Oral   Take 25 mg by mouth 3 (three) times daily.         . hydrocortisone (ANUSOL-HC) 25 MG suppository   Rectal   Place 25 mg rectally 2 (two) times daily as needed.         . isosorbide mononitrate (IMDUR) 60 MG 24 hr tablet   Oral   Take 1 tablet (60 mg total) by mouth daily before breakfast.   30 tablet   6   . ketorolac (ACULAR) 0.4 % SOLN   Right Eye   Place 1 drop into the right eye 2 (two) times daily.         Marland Kitchen losartan-hydrochlorothiazide (HYZAAR) 100-25 MG per tablet   Oral   Take 1 tablet by mouth daily before breakfast.          .  lubiprostone (AMITIZA) 24 MCG capsule   Oral   Take 1 capsule (24 mcg total) by mouth 2 (two) times daily with  a meal.   60 capsule   11   . metoCLOPramide (REGLAN) 5 MG tablet   Oral   Take 5 mg by mouth 4 (four) times daily -  before meals and at bedtime.          . nitroGLYCERIN (NITROLINGUAL) 0.4 MG/SPRAY spray   Sublingual   Place 2 sprays under the tongue every 5 (five) minutes as needed. Use 2 sprays under tongue as needed for chest pain. Repeat 2 sprays if pain persists after 15 minutes if pain still persists         . nystatin (MYCOSTATIN/NYSTOP) 100000 UNIT/GM POWD   Topical   Apply topically 3 (three) times daily. Apply under breast bilaterally after applying Nystatin cream for yeast rash.  Once rash has resolved, continue powder twice daily.         Marland Kitchen nystatin cream (MYCOSTATIN)   Topical   Apply 1 application topically 3 (three) times daily. Apply under both breasts.         . Olopatadine HCl (PATADAY) 0.2 % SOLN   Both Eyes   Place 1 drop into both eyes daily.         Marland Kitchen oxyCODONE-acetaminophen (PERCOCET) 7.5-325 MG per tablet   Oral   Take 1 tablet by mouth 3 (three) times daily.         . polyethylene glycol (MIRALAX / GLYCOLAX) packet      Mix 1 capful in 8oz of juice/water and drink every day   14 each   3   . potassium chloride (K-DUR) 10 MEQ tablet   Oral   Take 10 mEq by mouth 2 (two) times daily. Do not crush         . rosuvastatin (CRESTOR) 5 MG tablet   Oral   Take 5 mg by mouth at bedtime.          . senna (SENOKOT) 8.6 MG TABS   Oral   Take 1 tablet by mouth 2 (two) times daily.         Marland Kitchen spironolactone (ALDACTONE) 25 MG tablet   Oral   Take 25 mg by mouth daily before breakfast.          . magnesium citrate SOLN   Oral   Take 296 mLs (1 Bottle total) by mouth once.   195 mL   0     PRN if no BM in 5 days   . sucralfate (CARAFATE) 1 G tablet   Oral   Take 1 g by mouth 4 (four) times daily.          BP 167/57  Pulse 64  Temp(Src) 99 F (37.2 C) (Oral)  Resp 18  SpO2 98% Physical Exam  Nursing note and vitals  reviewed. Constitutional: She is oriented to person, place, and time. She appears well-developed and well-nourished.  HENT:  Head: Normocephalic and atraumatic.  Eyes: Pupils are equal, round, and reactive to light.  Neck: Normal range of motion.  Cardiovascular: Normal rate and regular rhythm.   Pulmonary/Chest: Effort normal and breath sounds normal.  Abdominal: Soft. Bowel sounds are normal. She exhibits no distension. There is no tenderness.  Neurological: She is alert and oriented to person, place, and time.  Skin: Skin is warm and dry.    ED Course  Procedures (including critical care time) Labs Reviewed - No data  to display Dg Abd Acute W/chest  12/31/2012   *RADIOLOGY REPORT*  Clinical Data: Constipation, abdominal pain  ACUTE ABDOMEN SERIES (ABDOMEN 2 VIEW & CHEST 1 VIEW)  Comparison: CT abdomen pelvis dated 09/23/2012  Findings: Chronic interstitial markings.  No focal consolidation. No pleural effusion or pneumothorax.  Mild cardiomegaly.  Nonobstructive bowel gas pattern.  No evidence of free air on the lateral decubitus view.  Moderate stool in the left colon.  Degenerative changes of the visualized thoracolumbar spine.  IMPRESSION: No evidence of acute cardiopulmonary disease.  No evidence of small bowel obstruction or free air.  Moderate stool in the left colon.   Original Report Authenticated By: Charline Bills, M.D.   1. Constipation     MDM  Patient had large formed BM after suppisitory   Arman Filter, NP 01/01/13 0038

## 2013-01-01 MED ORDER — MAGNESIUM CITRATE PO SOLN: 1.0000 | Freq: Once | ORAL | Status: DC

## 2013-01-01 NOTE — ED Notes (Signed)
Report called to [facility], [facility] called for transport

## 2013-01-02 NOTE — ED Provider Notes (Signed)
Medical screening examination/treatment/procedure(s) were performed by non-physician practitioner and as supervising physician I was immediately available for consultation/collaboration.  Lada Fulbright T Mykai Wendorf, MD 01/02/13 1513 

## 2013-01-20 ENCOUNTER — Encounter (HOSPITAL_COMMUNITY): Payer: Self-pay | Admitting: *Deleted

## 2013-01-20 ENCOUNTER — Emergency Department (HOSPITAL_COMMUNITY): Payer: Medicare Other

## 2013-01-20 ENCOUNTER — Emergency Department (HOSPITAL_COMMUNITY)
Admission: EM | Admit: 2013-01-20 | Discharge: 2013-01-20 | Disposition: A | Discharge status: Home / Self Care | Payer: Medicare Other | Attending: Emergency Medicine | Admitting: Emergency Medicine

## 2013-01-20 DIAGNOSIS — Z8669 Personal history of other diseases of the nervous system and sense organs: Secondary | ICD-10-CM | POA: Insufficient documentation

## 2013-01-20 DIAGNOSIS — Z9889 Other specified postprocedural states: Secondary | ICD-10-CM | POA: Insufficient documentation

## 2013-01-20 DIAGNOSIS — Z79899 Other long term (current) drug therapy: Secondary | ICD-10-CM | POA: Insufficient documentation

## 2013-01-20 DIAGNOSIS — M6281 Muscle weakness (generalized): Secondary | ICD-10-CM | POA: Insufficient documentation

## 2013-01-20 DIAGNOSIS — Z90710 Acquired absence of both cervix and uterus: Secondary | ICD-10-CM | POA: Insufficient documentation

## 2013-01-20 DIAGNOSIS — K5909 Other constipation: Secondary | ICD-10-CM | POA: Insufficient documentation

## 2013-01-20 DIAGNOSIS — R6883 Chills (without fever): Secondary | ICD-10-CM | POA: Insufficient documentation

## 2013-01-20 DIAGNOSIS — E669 Obesity, unspecified: Secondary | ICD-10-CM | POA: Insufficient documentation

## 2013-01-20 DIAGNOSIS — F411 Generalized anxiety disorder: Secondary | ICD-10-CM | POA: Insufficient documentation

## 2013-01-20 DIAGNOSIS — IMO0002 Reserved for concepts with insufficient information to code with codable children: Secondary | ICD-10-CM | POA: Insufficient documentation

## 2013-01-20 DIAGNOSIS — Z8679 Personal history of other diseases of the circulatory system: Secondary | ICD-10-CM | POA: Insufficient documentation

## 2013-01-20 DIAGNOSIS — Z8719 Personal history of other diseases of the digestive system: Secondary | ICD-10-CM | POA: Insufficient documentation

## 2013-01-20 DIAGNOSIS — E785 Hyperlipidemia, unspecified: Secondary | ICD-10-CM | POA: Insufficient documentation

## 2013-01-20 DIAGNOSIS — M1990 Unspecified osteoarthritis, unspecified site: Secondary | ICD-10-CM | POA: Insufficient documentation

## 2013-01-20 DIAGNOSIS — I252 Old myocardial infarction: Secondary | ICD-10-CM | POA: Insufficient documentation

## 2013-01-20 DIAGNOSIS — R109 Unspecified abdominal pain: Secondary | ICD-10-CM | POA: Insufficient documentation

## 2013-01-20 DIAGNOSIS — I2510 Atherosclerotic heart disease of native coronary artery without angina pectoris: Secondary | ICD-10-CM | POA: Insufficient documentation

## 2013-01-20 DIAGNOSIS — Z7982 Long term (current) use of aspirin: Secondary | ICD-10-CM | POA: Insufficient documentation

## 2013-01-20 DIAGNOSIS — N390 Urinary tract infection, site not specified: Secondary | ICD-10-CM | POA: Insufficient documentation

## 2013-01-20 DIAGNOSIS — K219 Gastro-esophageal reflux disease without esophagitis: Secondary | ICD-10-CM | POA: Insufficient documentation

## 2013-01-20 DIAGNOSIS — E119 Type 2 diabetes mellitus without complications: Secondary | ICD-10-CM | POA: Insufficient documentation

## 2013-01-20 DIAGNOSIS — G4733 Obstructive sleep apnea (adult) (pediatric): Secondary | ICD-10-CM | POA: Insufficient documentation

## 2013-01-20 DIAGNOSIS — Z9861 Coronary angioplasty status: Secondary | ICD-10-CM | POA: Insufficient documentation

## 2013-01-20 DIAGNOSIS — Z862 Personal history of diseases of the blood and blood-forming organs and certain disorders involving the immune mechanism: Secondary | ICD-10-CM | POA: Insufficient documentation

## 2013-01-20 DIAGNOSIS — I10 Essential (primary) hypertension: Secondary | ICD-10-CM | POA: Insufficient documentation

## 2013-01-20 DIAGNOSIS — F0390 Unspecified dementia without behavioral disturbance: Secondary | ICD-10-CM | POA: Insufficient documentation

## 2013-01-20 DIAGNOSIS — Z87442 Personal history of urinary calculi: Secondary | ICD-10-CM | POA: Insufficient documentation

## 2013-01-20 DIAGNOSIS — Z8739 Personal history of other diseases of the musculoskeletal system and connective tissue: Secondary | ICD-10-CM | POA: Insufficient documentation

## 2013-01-20 DIAGNOSIS — I509 Heart failure, unspecified: Secondary | ICD-10-CM | POA: Insufficient documentation

## 2013-01-20 LAB — BASIC METABOLIC PANEL
BUN: 20 mg/dL (ref 6–23)
CO2: 23 mEq/L (ref 19–32)
Calcium: 10.1 mg/dL (ref 8.4–10.5)
Chloride: 102 mEq/L (ref 96–112)
Creatinine, Ser: 1.43 mg/dL — ABNORMAL HIGH (ref 0.50–1.10)
GFR calc Af Amer: 38 mL/min — ABNORMAL LOW (ref >90)
GFR calc non Af Amer: 33 mL/min — ABNORMAL LOW (ref >90)
Glucose, Bld: 98 mg/dL (ref 70–99)
Potassium: 4.2 mEq/L (ref 3.5–5.1)
Sodium: 135 mEq/L (ref 135–145)

## 2013-01-20 LAB — URINALYSIS, ROUTINE W REFLEX MICROSCOPIC
Bilirubin Urine: NEGATIVE
Ketones, ur: NEGATIVE mg/dL
Nitrite: POSITIVE — AB
Specific Gravity, Urine: 1.01 (ref 1.005–1.030)
Urobilinogen, UA: 0.2 mg/dL (ref 0.0–1.0)

## 2013-01-20 LAB — CBC
Hemoglobin: 11.4 g/dL — ABNORMAL LOW (ref 12.0–15.0)
MCH: 27.6 pg (ref 26.0–34.0)
MCHC: 32.7 g/dL (ref 30.0–36.0)
Platelets: 268 10*3/uL (ref 150–400)
RDW: 14.2 % (ref 11.5–15.5)

## 2013-01-20 LAB — LACTIC ACID, PLASMA: Lactic Acid, Venous: 0.8 mmol/L (ref 0.5–2.2)

## 2013-01-20 LAB — URINE MICROSCOPIC-ADD ON: Urine-Other: NONE SEEN

## 2013-01-20 MED ORDER — ONDANSETRON HCL 4 MG/2ML IJ SOLN
4.0000 mg | Freq: Once | INTRAMUSCULAR | Status: AC
  Administered 2013-01-20: 4 mg via INTRAVENOUS

## 2013-01-20 MED ORDER — PHENAZOPYRIDINE HCL 200 MG PO TABS: 200.0000 mg | ORAL_TABLET | Freq: Three times a day (TID) | ORAL | Status: DC

## 2013-01-20 MED ORDER — CEPHALEXIN 500 MG PO CAPS: 500.0000 mg | ORAL_CAPSULE | Freq: Four times a day (QID) | ORAL | Status: DC

## 2013-01-20 MED ORDER — PHENAZOPYRIDINE HCL 200 MG PO TABS
200.0000 mg | ORAL_TABLET | Freq: Three times a day (TID) | ORAL | Status: DC
  Administered 2013-01-20: 200 mg via ORAL
  Filled 2013-01-20: qty 1

## 2013-01-20 MED ORDER — DEXTROSE 5 % IV SOLN
1.0000 g | Freq: Once | INTRAVENOUS | Status: AC
  Administered 2013-01-20: 1 g via INTRAVENOUS
  Filled 2013-01-20: qty 10

## 2013-01-20 MED ORDER — ONDANSETRON HCL 4 MG/2ML IJ SOLN
INTRAMUSCULAR | Status: AC
  Filled 2013-01-20: qty 2

## 2013-01-20 NOTE — ED Notes (Signed)
Per ems: pt from [facility], was recently diagnosed with UTI and began taking Cipro Wednesday. Pt c/o lower abd pain today, is distended above bladder. Nursing home staff reports pt is not acting her norm, cannot tell staff her last name or birthday. Pt may be febrile, pt's roommate told ems pt has been c/o feeling "hot", staff had to turn on [facility] for pt. bp 178 palpated, pulse 68, respirations 16 even/regular

## 2013-01-20 NOTE — ED Notes (Signed)
Pt transported to Xray. 

## 2013-01-20 NOTE — ED Notes (Signed)
OZH:[id number]<HQ> Expected date:01/20/13<BR> Expected time: 8:48 AM<BR> Means of arrival:Ambulance<BR> Comments:<BR> Weakness  UTI

## 2013-01-20 NOTE — ED Provider Notes (Signed)
CSN: [id number]     Arrival date & time 01/20/13  7829 History     First MD Initiated Contact with Patient 01/20/13 [phone]     Chief Complaint  Patient presents with  . Abdominal Pain  . Urinary Tract Infection  . Weakness   HPI Patient presents from the assisted living facility with complaints of ongoing pain with urination and questionable confusion earlier today.  Patient states she's had chills at home.  She's noted by EMS to have a blood pressure 178, pulse rate 68 and respirations of 16.  The patient denies nausea and vomiting.  She denies chest pain shortness of breath.  No flank pain.  She is alert and at times 2.  She does carry history of dementia.  She was recently started on ciprofloxacin 3 days ago for urinary tract infection.  Review of prior urine cultures demonstrated history of pansensitive Escherichia coli urinary tract infections.  She denies diarrhea.  She denies abdominal pain.  Past Medical History  Diagnosis Date  . Coronary artery disease   . Dementia   . Hypertension   . Hyperlipidemia   . Anxiety   . GERD (gastroesophageal reflux disease)   . CHF (congestive heart failure)   . Polyp, stomach 11/11/2006  . Aphakia of left eye   . Obese   . MI (myocardial infarction)   . Sleep apnea   . Chest pain at rest 12/20/2011  . CAD (coronary artery disease), cutting balloon athrectomy of her dominant AV groove of LCX.  2007 12/20/2011  . Bradycardia 12/20/2011  . OSA on CPAP 12/20/2011  . HTN (hypertension) 12/20/2011  . Dyslipidemia  12/20/2011  . Diabetes mellitus   . Cardiomyopathy   . DJD (degenerative joint disease)   . Osteoarthritis   . History of GI bleed   . Shortness of breath   . Atrial fibrillation   . Sigmoid diverticulosis   . Nephrolithiasis     bilateral  . Anemia, iron deficiency 10/24/2012  . Constipation due to pain medication 10/24/2012   Past Surgical History  Procedure Laterality Date  . Immature cataract      left eye  . Back surgery    .  Appendectomy    . Breast surgery    . Fracture surgery    . Dilation and curettage of uterus    . Abdominal hysterectomy    . Coronary angioplasty    . Esophagogastroduodenoscopy    . Colonoscopy    . Eus    . Esophagogastroduodenoscopy  02/21/2012    Procedure: ESOPHAGOGASTRODUODENOSCOPY (EGD);  Surgeon: Iva Boop, MD;  Location: [facility];  Service: Endoscopy;  Laterality: N/A;  pt. being evaluated for a pacemaker  . Balloon dilation  02/21/2012    Procedure: BALLOON DILATION;  Surgeon: Iva Boop, MD;  Location: [facility];  Service: Endoscopy;  Laterality: N/A;  . Colonoscopy N/A 10/24/2012    Procedure: COLONOSCOPY;  Surgeon: Iva Boop, MD;  Location: [facility];  Service: Endoscopy;  Laterality: N/A;  . Coronary angioplasty  03-07-2004    cutting balloon  . Cardiac catheterization  06/05/2004    high grade disease AV groove CX  . Cardiac catheterization  04/19/2005    noncritical CAD  . US echocardiography  01/25/2008    mild DUST,mild MR,mod. ca+ mitral annular,AOV mildly sclerotid  . Lexiscan mycardial perfusion scan  01/25/2008    Normal   Family History  Problem Relation Age of Onset  . Colon cancer Mother   .  Heart disease Mother   . Stroke Mother    History  Substance Use Topics  . Smoking status: Never Smoker   . Smokeless tobacco: Never Used  . Alcohol Use: No   OB History   Grav Para Term Preterm Abortions TAB SAB Ect Mult Living                 Review of Systems  Unable to perform ROS: Dementia    Allergies  Chicken allergy and Fish allergy  Home Medications   Current Outpatient Rx  Name  Route  Sig  Dispense  Refill  . amLODipine (NORVASC) 5 MG tablet   Oral   Take 10 mg by mouth daily before breakfast.          . aspirin EC 81 MG tablet   Oral   Take 81 mg by mouth daily.         . calcium-vitamin D (OSCAL WITH D) 500-200 MG-UNIT per tablet   Oral   Take 1 tablet by mouth daily.         . cetirizine (ZYRTEC) 10 MG  tablet   Oral   Take 10 mg by mouth daily as needed.         . cholecalciferol (VITAMIN D) 1000 UNITS tablet   Oral   Take 1,000 Units by mouth daily.         . ciprofloxacin (CIPRO) 500 MG tablet   Oral   Take 500 mg by mouth 2 (two) times daily. For 7 days. Started on started on 01-17-13         . citalopram (CELEXA) 20 MG tablet   Oral   Take 20 mg by mouth daily before breakfast.          . clonazePAM (KLONOPIN) 1 MG tablet   Oral   Take 0.5 mg by mouth daily as needed. For anxiety         . docusate sodium (COLACE) 100 MG capsule   Oral   Take 100 mg by mouth 2 (two) times daily.         Marland Kitchen esomeprazole (NEXIUM) 40 MG capsule   Oral   Take 40 mg by mouth daily before breakfast.         . ezetimibe (ZETIA) 10 MG tablet   Oral   Take 10 mg by mouth at bedtime.         . fluticasone (FLONASE) 50 MCG/ACT nasal spray   Nasal   Place 2 sprays into the nose daily.         . hydrALAZINE (APRESOLINE) 25 MG tablet   Oral   Take 25 mg by mouth 3 (three) times daily.         . hydrocortisone (ANUSOL-HC) 25 MG suppository   Rectal   Place 25 mg rectally 2 (two) times daily as needed.         . isosorbide mononitrate (IMDUR) 60 MG 24 hr tablet   Oral   Take 1 tablet (60 mg total) by mouth daily before breakfast.   30 tablet   6   . ketorolac (ACULAR) 0.4 % SOLN   Right Eye   Place 1 drop into the right eye 2 (two) times daily.         Marland Kitchen losartan-hydrochlorothiazide (HYZAAR) 100-25 MG per tablet   Oral   Take 1 tablet by mouth daily before breakfast.          . lubiprostone (AMITIZA) 24 MCG capsule  Oral   Take 1 capsule (24 mcg total) by mouth 2 (two) times daily with a meal.   60 capsule   11   . magnesium citrate SOLN   Oral   Take 296 mLs (1 Bottle total) by mouth once.   195 mL   0     PRN if no BM in 5 days   . metoCLOPramide (REGLAN) 5 MG tablet   Oral   Take 5 mg by mouth 4 (four) times daily -  before meals and at  bedtime.          . nitroGLYCERIN (NITROLINGUAL) 0.4 MG/SPRAY spray   Sublingual   Place 2 sprays under the tongue every 5 (five) minutes as needed. Use 2 sprays under tongue as needed for chest pain. Repeat 2 sprays if pain persists after 15 minutes if pain still persists         . nystatin (MYCOSTATIN/NYSTOP) 100000 UNIT/GM POWD   Topical   Apply topically 3 (three) times daily. Apply under breast bilaterally after applying Nystatin cream for yeast rash.  Once rash has resolved, continue powder twice daily.         Marland Kitchen nystatin cream (MYCOSTATIN)   Topical   Apply 1 application topically 3 (three) times daily. Apply under both breasts.         . Olopatadine HCl (PATADAY) 0.2 % SOLN   Both Eyes   Place 1 drop into both eyes daily.         Marland Kitchen oxyCODONE-acetaminophen (PERCOCET) 7.5-325 MG per tablet   Oral   Take 1 tablet by mouth 3 (three) times daily.         . phenazopyridine (PYRIDIUM) 100 MG tablet   Oral   Take 100 mg by mouth 3 (three) times daily as needed for pain (pain).         . polyethylene glycol (MIRALAX / GLYCOLAX) packet      Mix 1 capful in 8oz of juice/water and drink every day   14 each   3   . potassium chloride (K-DUR) 10 MEQ tablet   Oral   Take 10 mEq by mouth 2 (two) times daily. Do not crush         . rosuvastatin (CRESTOR) 5 MG tablet   Oral   Take 5 mg by mouth at bedtime.          . senna (SENOKOT) 8.6 MG TABS   Oral   Take 1 tablet by mouth 2 (two) times daily.         Marland Kitchen spironolactone (ALDACTONE) 25 MG tablet   Oral   Take 25 mg by mouth daily before breakfast.          . sucralfate (CARAFATE) 1 G tablet   Oral   Take 1 g by mouth 4 (four) times daily.         . cephALEXin (KEFLEX) 500 MG capsule   Oral   Take 1 capsule (500 mg total) by mouth 4 (four) times daily.   28 capsule   0   . phenazopyridine (PYRIDIUM) 200 MG tablet   Oral   Take 1 tablet (200 mg total) by mouth 3 (three) times daily.   6  tablet   0    BP 187/60  Pulse 66  Temp(Src) 99 F (37.2 C) (Oral)  Resp 18  SpO2 99% Physical Exam  Nursing note and vitals reviewed. Constitutional: She appears well-developed and well-nourished. No distress.  HENT:  Head: Normocephalic  and atraumatic.  Eyes: EOM are normal.  Neck: Normal range of motion.  Cardiovascular: Normal rate, regular rhythm and normal heart sounds.   Pulmonary/Chest: Effort normal and breath sounds normal.  Abdominal: Soft. She exhibits no distension. There is no tenderness.  Genitourinary:  Normal external genitalia.  No irritation around her urethra  Musculoskeletal: Normal range of motion.  Neurological: She is alert.  Skin: Skin is warm and dry.  Psychiatric: She has a normal mood and affect. Judgment normal.    ED Course   Procedures (including critical care time)  Labs Reviewed  CBC - Abnormal; Notable for the following:    Hemoglobin 11.4 (*)    HCT 34.9 (*)    All other components within normal limits  BASIC METABOLIC PANEL - Abnormal; Notable for the following:    Creatinine, Ser 1.43 (*)    GFR calc non Af Amer 33 (*)    GFR calc Af Amer 38 (*)    All other components within normal limits  URINALYSIS, ROUTINE W REFLEX MICROSCOPIC - Abnormal; Notable for the following:    Color, Urine AMBER (*)    Nitrite POSITIVE (*)    All other components within normal limits  CULTURE, BLOOD (ROUTINE X 2)  CULTURE, BLOOD (ROUTINE X 2)  URINE CULTURE  LACTIC ACID, PLASMA  URINE MICROSCOPIC-ADD ON   Dg Chest 2 View  01/20/2013   *RADIOLOGY REPORT*  Clinical Data: Fever.  Left lower chest pain.  Current history of hypertension.  CHEST - 2 VIEW  Comparison: One-view chest x-ray 12/31/2012, 09/23/2012, 07/19/2012.  Two-view chest x-ray 12/29/2011.  Findings: Cardiac silhouette upper normal in size to slightly enlarged for the AP semi-erect technique.  Thoracic aorta mildly tortuous and atherosclerotic, unchanged.  Hilar and mediastinal contours  otherwise unremarkable.  Suboptimal inspiration on the frontal image with better inspiration on the lateral.  Lateral image less than optimal as the patient was unable to raise the arms due to arthritis involving the shoulders.  Taking this into account, lungs clear.  Pulmonary vascularity normal. Bronchovascular markings normal.  No pleural effusions.  No pneumothorax.  Severe degenerative changes involving the shoulder joints.  IMPRESSION: Suboptimal inspiration.  No acute cardiopulmonary disease.   Original Report Authenticated By: Hulan Saas, M.D.   I personally reviewed the imaging tests through PACS system I reviewed available ER/hospitalization records through the EMR   1. Urinary tract infection     MDM  Patient is overall very well appearing.  Her rectal temperature is normal.  Her lactate is normal.  Her vital signs are normal.  She does have nitrite positive urine.  We'll discontinue her ciprofloxacin and begin her on Keflex.  She received a dose of Rocephin in the emergency department.  She overall looks good I think she is stable for discharge back to the assisted living facility.  I've asked that she be returned to the ER for new or worsening symptoms.  Lyanne Co, MD 01/20/13 1159

## 2013-01-20 NOTE — ED Notes (Signed)
ptar called for pt transportation back to [facility]

## 2013-01-21 LAB — URINE CULTURE

## 2013-01-26 LAB — CULTURE, BLOOD (ROUTINE X 2): Culture: NO GROWTH

## 2013-03-01 ENCOUNTER — Ambulatory Visit (INDEPENDENT_AMBULATORY_CARE_PROVIDER_SITE_OTHER): Payer: Medicare Other | Admitting: Cardiovascular Disease

## 2013-03-01 ENCOUNTER — Encounter: Payer: Self-pay | Admitting: Cardiovascular Disease

## 2013-03-01 VITALS — BP 152/92 | HR 57 | Ht 66.0 in | Wt 167.0 lb

## 2013-03-01 DIAGNOSIS — I2510 Atherosclerotic heart disease of native coronary artery without angina pectoris: Secondary | ICD-10-CM

## 2013-03-01 DIAGNOSIS — I10 Essential (primary) hypertension: Secondary | ICD-10-CM

## 2013-03-01 DIAGNOSIS — E785 Hyperlipidemia, unspecified: Secondary | ICD-10-CM

## 2013-03-01 NOTE — Assessment & Plan Note (Signed)
Status post AV groove circumflex Cutting Balloon atherectomy by myself in September of 07 noncritical disease otherwise. I recast her 12/21/11 revealing widely patent coronary arteries in the setting of ongoing chest pain she saw Nada Boozer registered nurse practitioner several months ago with continued complaints of chest pain and a Myoview stress test was done which was normal. She does take supplemental glycerin spray which she says helps her pain however, she also says her pain occurs after eating vegetables suggesting that this is a gastrointestinal cause.

## 2013-03-01 NOTE — Assessment & Plan Note (Signed)
On statin therapy with excellent fasting lipid profile for secondary prevention

## 2013-03-01 NOTE — Progress Notes (Signed)
03/01/2013 [name]   January 09, 1927  [id number]  Primary Physician [name] Jenny, MD Primary Cardiologist: [name] Gess MD [name]   HPI:  The patient is an 79 year old moderately overweight widowed [place] American female with no children who was last seen by me 6 months ago and by [name] 4 months ago. She has a history of hypertension and bradycardia. She also has known CAD, status post cutting balloon atherectomy of an AV groove circumflex in 2007 by me with normal LV function. She does have some dementia as well as GERD but otherwise is asymptomatic.she saw [name] registered nurse practitioner in the office several months ago complaining of chest pain which was nitrate responsive. A subsequent Myoview stress test was normal. On further questioning her pains seems to occur after eating vegetables suggesting a gastrointestinal etiology vascular profile performed 09/27/12 revealed the left of 86, LDL 29 and HDL of 39.    Current Outpatient Prescriptions  Medication Sig Dispense Refill  . amLODipine (NORVASC) 5 MG tablet Take 10 mg by mouth daily before breakfast.       . aspirin EC 81 MG tablet Take 81 mg by mouth daily.      . calcium-vitamin D (OSCAL WITH D) 500-200 MG-UNIT per tablet Take 1 tablet by mouth daily.      . cetirizine (ZYRTEC) 10 MG tablet Take 10 mg by mouth daily as needed.      . cholecalciferol (VITAMIN D) 1000 UNITS tablet Take by mouth daily. 800units      . citalopram (CELEXA) 20 MG tablet Take 20 mg by mouth daily before breakfast.       . clonazePAM (KLONOPIN) 1 MG tablet Take 0.5 mg by mouth daily as needed. For anxiety      . diclofenac sodium (VOLTAREN) 1 % GEL Apply 4 g topically 4 (four) times daily.      Marland Kitchen docusate sodium (COLACE) 100 MG capsule Take 100 mg by mouth 2 (two) times daily.      Marland Kitchen esomeprazole (NEXIUM) 40 MG capsule Take 40 mg by mouth daily before breakfast.      . fluticasone (FLONASE) 50 MCG/ACT nasal spray Place 2  sprays into the nose daily.      . hydrALAZINE (APRESOLINE) 25 MG tablet Take 25 mg by mouth 3 (three) times daily.      . hydrocortisone (ANUSOL-HC) 25 MG suppository Place 25 mg rectally 2 (two) times daily as needed.      . isosorbide mononitrate (IMDUR) 60 MG 24 hr tablet Take 1 tablet (60 mg total) by mouth daily before breakfast.  30 tablet  6  . ketorolac (ACULAR) 0.4 % SOLN Place 1 drop into the right eye 2 (two) times daily.      Marland Kitchen lisinopril (PRINIVIL,ZESTRIL) 5 MG tablet Take 5 mg by mouth daily.      Marland Kitchen losartan-hydrochlorothiazide (HYZAAR) 100-25 MG per tablet Take 1 tablet by mouth daily before breakfast.       . lubiprostone (AMITIZA) 24 MCG capsule Take 1 capsule (24 mcg total) by mouth 2 (two) times daily with a meal.  60 capsule  11  . magnesium citrate SOLN Take 296 mLs (1 Bottle total) by mouth once.  195 mL  0  . metoCLOPramide (REGLAN) 5 MG tablet Take 5 mg by mouth 4 (four) times daily -  before meals and at bedtime.       . mirabegron ER (MYRBETRIQ) 25 MG TB24 tablet Take 25 mg by mouth daily.      Marland Kitchen  nitroGLYCERIN (NITROLINGUAL) 0.4 MG/SPRAY spray Place 2 sprays under the tongue every 5 (five) minutes as needed. Use 2 sprays under tongue as needed for chest pain. Repeat 2 sprays if pain persists after 15 minutes if pain still persists      . Olopatadine HCl (PATADAY) 0.2 % SOLN Place 1 drop into both eyes daily.      Marland Kitchen oxyCODONE-acetaminophen (PERCOCET) 7.5-325 MG per tablet Take 1 tablet by mouth 3 (three) times daily.      . polyethylene glycol (MIRALAX / GLYCOLAX) packet Mix 1 capful in 8oz of juice/water and drink every day  14 each  3  . potassium chloride (K-DUR) 10 MEQ tablet Take 10 mEq by mouth 2 (two) times daily. Do not crush      . rosuvastatin (CRESTOR) 5 MG tablet Take 5 mg by mouth at bedtime.       . senna (SENOKOT) 8.6 MG TABS Take 1 tablet by mouth 2 (two) times daily.      Marland Kitchen spironolactone (ALDACTONE) 25 MG tablet Take 25 mg by mouth daily before breakfast.        . phenazopyridine (PYRIDIUM) 100 MG tablet Take 100 mg by mouth 3 (three) times daily as needed for pain (pain).      . phenazopyridine (PYRIDIUM) 200 MG tablet Take 1 tablet (200 mg total) by mouth 3 (three) times daily.  6 tablet  0  . sucralfate (CARAFATE) 1 G tablet Take 1 g by mouth 4 (four) times daily.       No current facility-administered medications for this visit.    Allergies  Allergen Reactions  . Chicken Allergy     unknown  . Fish Allergy     unknown    History   Social History  . Marital Status: Widowed    Spouse Name: N/A    Number of Children: 0  . Years of Education: N/A   Occupational History  . Not on file.   Social History Main Topics  . Smoking status: Never Smoker   . Smokeless tobacco: Never Used  . Alcohol Use: No  . Drug Use: No  . Sexual Activity: No   Other Topics Concern  . 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 She raised many of her nieces and nephews, she also has a sister in [place]. Never smoker never alcohol.     Review of Systems: General: negative for chills, fever, night sweats or weight changes.  Cardiovascular: negative for chest pain, dyspnea on exertion, edema, orthopnea, palpitations, paroxysmal nocturnal dyspnea or shortness of breath Dermatological: negative for rash Respiratory: negative for cough or wheezing Urologic: negative for hematuria Abdominal: negative for nausea, vomiting, diarrhea, bright red blood per rectum, melena, or hematemesis Neurologic: negative for visual changes, syncope, or dizziness All other systems reviewed and are otherwise negative except as noted above.    Blood pressure 152/92, pulse 57, height 5\' 6"  (1.676 m), weight 167 lb (75.751 kg).  General appearance: alert and no distress Neck: no adenopathy, no carotid bruit, no JVD, supple, symmetrical, trachea midline and thyroid not enlarged, symmetric, no  tenderness/mass/nodules Lungs: clear to auscultation bilaterally Heart: regular rate and rhythm, S1, S2 normal, no murmur, click, rub or gallop Extremities: extremities normal, atraumatic, no cyanosis or edema  EKG normal sinus rhythm at 61 without ST or T wave changes  ASSESSMENT AND PLAN:   CAD (coronary artery disease), cutting balloon athrectomy of her dominant AV groove  of LCX.  2007 Status post AV groove circumflex Cutting Balloon atherectomy by myself in September of 07 noncritical disease otherwise. I recast her 12/21/11 revealing widely patent coronary arteries in the setting of ongoing chest pain she saw [name] registered nurse practitioner several months ago with continued complaints of chest pain and a Myoview stress test was done which was normal. She does take supplemental glycerin spray which she says helps her pain however, she also says her pain occurs after eating vegetables suggesting that this is a gastrointestinal cause.  Dyslipidemia  On statin therapy with excellent fasting lipid profile for secondary prevention      [name] Gess MD [facility], [facility] 03/01/2013 10:14 AM

## 2013-03-01 NOTE — Patient Instructions (Addendum)
Your physician wants you to follow-up in: 6 months with Nada Boozer and 1 year with Dr Allyson Sabal.  You will receive a reminder letter in the mail two months in advance. If you don't receive a letter, please call our office to schedule the follow-up appointment.

## 2013-04-16 ENCOUNTER — Ambulatory Visit (INDEPENDENT_AMBULATORY_CARE_PROVIDER_SITE_OTHER): Payer: Self-pay | Admitting: Surgery

## 2013-04-20 ENCOUNTER — Ambulatory Visit (INDEPENDENT_AMBULATORY_CARE_PROVIDER_SITE_OTHER): Payer: Self-pay | Admitting: General Surgery

## 2013-05-15 ENCOUNTER — Ambulatory Visit (INDEPENDENT_AMBULATORY_CARE_PROVIDER_SITE_OTHER): Payer: Self-pay | Admitting: General Surgery

## 2013-06-05 ENCOUNTER — Ambulatory Visit (INDEPENDENT_AMBULATORY_CARE_PROVIDER_SITE_OTHER): Payer: Medicare Other | Admitting: General Surgery

## 2013-06-05 ENCOUNTER — Encounter (INDEPENDENT_AMBULATORY_CARE_PROVIDER_SITE_OTHER): Payer: Self-pay | Admitting: General Surgery

## 2013-06-05 ENCOUNTER — Encounter (INDEPENDENT_AMBULATORY_CARE_PROVIDER_SITE_OTHER): Payer: Self-pay

## 2013-06-05 VITALS — BP 134/84 | HR 76 | Temp 98.0°F | Resp 18 | Ht 60.0 in | Wt 161.2 lb

## 2013-06-05 DIAGNOSIS — K4090 Unilateral inguinal hernia, without obstruction or gangrene, not specified as recurrent: Secondary | ICD-10-CM | POA: Insufficient documentation

## 2013-06-05 NOTE — Progress Notes (Signed)
The patient's office visit has been documented as a preoperative history and physical.

## 2013-06-05 NOTE — H&P (Signed)
[name] is an 79 y.o. female.   Chief Complaint: Right lower abdominal pain. HPI: Over the last 2 months the patient has been having increasing lower abdominal pain. It has particularly been bothering her on the right side. She's been seeing a urologist who ordered a CT scan demonstrating a large right inguinal hernia with containing small bowel. The bowel is nonobstructed. She was referred to us for evaluation.  She has a long list of medical problems including coronary artery disease hypertension congestive heart failure diabetes atrial fibrillation and sigmoid diverticulosis. Because of her cardiac history or cardiac evaluation will be necessary prior to scheduling surgery.  Past Medical History  Diagnosis Date  . Coronary artery disease   . Dementia   . Hypertension   . Hyperlipidemia   . Anxiety   . GERD (gastroesophageal reflux disease)   . CHF (congestive heart failure)   . Polyp, stomach 11/11/2006  . Aphakia of left eye   . Obese   . MI (myocardial infarction)   . Sleep apnea   . Chest pain at rest 12/20/2011  . CAD (coronary artery disease), cutting balloon athrectomy of her dominant AV groove of LCX.  2007 12/20/2011  . Bradycardia 12/20/2011  . OSA on CPAP 12/20/2011  . HTN (hypertension) 12/20/2011  . Dyslipidemia  12/20/2011  . Diabetes mellitus   . Cardiomyopathy   . DJD (degenerative joint disease)   . Osteoarthritis   . History of GI bleed   . Shortness of breath   . Atrial fibrillation   . Sigmoid diverticulosis   . Nephrolithiasis     bilateral  . Anemia, iron deficiency 10/24/2012  . Constipation due to pain medication 10/24/2012    Past Surgical History  Procedure Laterality Date  . Immature cataract      left eye  . Back surgery    . Appendectomy    . Breast surgery    . Fracture surgery    . Dilation and curettage of uterus    . Abdominal hysterectomy    . Coronary angioplasty    . Esophagogastroduodenoscopy    . Colonoscopy    . Eus    .  Esophagogastroduodenoscopy  02/21/2012    Procedure: ESOPHAGOGASTRODUODENOSCOPY (EGD);  Surgeon: Carl E Gessner, MD;  Location: [facility];  Service: Endoscopy;  Laterality: N/A;  pt. being evaluated for a pacemaker  . Balloon dilation  02/21/2012    Procedure: BALLOON DILATION;  Surgeon: Carl E Gessner, MD;  Location: [facility];  Service: Endoscopy;  Laterality: N/A;  . Colonoscopy N/A 10/24/2012    Procedure: COLONOSCOPY;  Surgeon: Carl E Gessner, MD;  Location: [facility];  Service: Endoscopy;  Laterality: N/A;  . Coronary angioplasty  03-07-2004    cutting balloon  . Cardiac catheterization  06/05/2004    high grade disease AV groove CX  . Cardiac catheterization  04/19/2005    noncritical CAD  . Us echocardiography  01/25/2008    mild DUST,mild MR,mod. ca+ mitral annular,AOV mildly sclerotid  . Lexiscan mycardial perfusion scan  01/25/2008    Normal    Family History  Problem Relation Age of Onset  . Colon cancer Mother   . Heart disease Mother   . Stroke Mother    Social History:  reports that she has never smoked. She has never used smokeless tobacco. She reports that she does not drink alcohol or use illicit drugs.  Allergies:  Allergies  Allergen Reactions  . Chicken Allergy     unknown  .   Fish Allergy     unknown     (Not in a hospital admission)  No results found for this or any previous visit (from the past 48 hour(s)). No results found.  Review of Systems  Cardiovascular: Negative for chest pain and palpitations.  Gastrointestinal: Positive for abdominal pain (right lower quadrant).  Musculoskeletal: Positive for joint pain (left ankle with swelling).    Blood pressure 134/84, pulse 76, temperature 98 F (36.7 C), resp. rate 18, height 5' (1.524 m), weight 161 lb 3.2 oz (73.12 kg). Physical Exam  Constitutional: She is oriented to person, place, and time. She appears well-developed and well-nourished.  HENT:  Head: Normocephalic and atraumatic.    Mouth/Throat:    Eyes: Conjunctivae and EOM are normal. Pupils are equal, round, and reactive to light.  Neck: Trachea normal and normal range of motion. Neck supple. Normal carotid pulses and no JVD present. Carotid bruit is not present. No mass present.  Cardiovascular: Normal rate, regular rhythm and normal heart sounds.   Respiratory: Effort normal and breath sounds normal.  GI: Soft. Normal appearance and bowel sounds are normal. There is tenderness in the right lower quadrant. A hernia is present. Hernia confirmed positive in the right inguinal area.    Musculoskeletal:       Right ankle: She exhibits swelling.       Left ankle: She exhibits swelling. She exhibits no deformity.  Neurological: She is alert and oriented to person, place, and time. She has normal reflexes.  Skin: Skin is warm and dry.  Psychiatric: She has a normal mood and affect. Her behavior is normal. Judgment and thought content normal.     Assessment/Plan Large reducible right inguinal hernia containing bowel.  Because of her history of cardiac disease the patient will require cardiac clearance prior to scheduling surgery. Once she is cleared we will go ahead with an open right inguinal hernia repair with mesh. We will watch her overnight for observation.  [name] O 06/05/2013, 11:11 AM    

## 2013-06-11 ENCOUNTER — Telehealth: Payer: Self-pay | Admitting: *Deleted

## 2013-06-11 NOTE — Telephone Encounter (Signed)
Fax received requesting surgical clearance for hernia repair.  Dr Allyson Sabal reviewed the chart and gave cardiac clearance.  Letter drafted and faxed

## 2013-06-12 ENCOUNTER — Encounter (HOSPITAL_COMMUNITY): Payer: Self-pay | Admitting: Pharmacy Technician

## 2013-06-13 NOTE — Pre-Procedure Instructions (Signed)
[name]  06/13/2013   Your procedure is scheduled on: Monday, December 22nd.               ( Free Valet  Parking is available)   Report to [facility]  2 * 3 at  7:00 AM.  Call this number if you have problems the morning of surgery: [phone]   Remember:   Do not eat food or drink liquids after midnight Sunday.   Take these medicines the morning of surgery with A SIP OF WATER: Norvasc, Zyrtec, Celexa, Nexium, Flonase, Isosorbide   Do not wear jewelry, make-up or nail polish.  Do not wear lotions, powders, or perfumes. You may NOT wear deodorant.  Do not shave underarms & legs 48 hours prior to surgery.    Do not bring valuables to the hospital.  [facility] is not responsible for any belongings or valuables.               Contacts, dentures or bridgework may not be worn into surgery.  Leave suitcase in the car. After surgery it may be brought to your room.  For patients admitted to the hospital, discharge time is determined by your treatment team.    Name and phone number of your driver:  [facility] SALES -- CAREGIVER, CNA, TRANSPORTATION  [phone]   Special Instructions: Shower using CHG 2 nights before surgery and the night before surgery.  If you shower the day of surgery use CHG.  Use special wash - you have one bottle of CHG for all showers.  You should use approximately 1/3 of the bottle for each shower. N/A   Please read over the following fact sheets that you were given: Pain Booklet and Surgical Site Infection Prevention

## 2013-06-14 ENCOUNTER — Encounter (HOSPITAL_COMMUNITY)
Admission: RE | Admit: 2013-06-14 | Discharge: 2013-06-14 | Disposition: A | Discharge status: Home / Self Care | Payer: Medicare Other | Source: Ambulatory Visit | Attending: General Surgery | Admitting: General Surgery

## 2013-06-14 ENCOUNTER — Encounter (HOSPITAL_COMMUNITY): Payer: Self-pay

## 2013-06-14 DIAGNOSIS — Z01818 Encounter for other preprocedural examination: Secondary | ICD-10-CM | POA: Insufficient documentation

## 2013-06-14 DIAGNOSIS — Z01812 Encounter for preprocedural laboratory examination: Secondary | ICD-10-CM | POA: Insufficient documentation

## 2013-06-14 DIAGNOSIS — I2510 Atherosclerotic heart disease of native coronary artery without angina pectoris: Secondary | ICD-10-CM | POA: Insufficient documentation

## 2013-06-14 LAB — COMPREHENSIVE METABOLIC PANEL
ALT: 11 U/L (ref 0–35)
BUN: 12 mg/dL (ref 6–23)
CO2: 24 mEq/L (ref 19–32)
Calcium: 10 mg/dL (ref 8.4–10.5)
Chloride: 102 mEq/L (ref 96–112)
Creatinine, Ser: 0.88 mg/dL (ref 0.50–1.10)
GFR calc Af Amer: 68 mL/min — ABNORMAL LOW (ref >90)
GFR calc non Af Amer: 59 mL/min — ABNORMAL LOW (ref >90)
Glucose, Bld: 123 mg/dL — ABNORMAL HIGH (ref 70–99)
Sodium: 136 mEq/L (ref 135–145)

## 2013-06-14 LAB — PROTIME-INR
INR: 0.85 (ref 0.00–1.49)
Prothrombin Time: 11.5 seconds — ABNORMAL LOW (ref 11.6–15.2)

## 2013-06-14 LAB — CBC WITH DIFFERENTIAL/PLATELET
Eosinophils Absolute: 0.1 10*3/uL (ref 0.0–0.7)
Eosinophils Relative: 2 % (ref 0–5)
HCT: 38.5 % (ref 36.0–46.0)
Lymphocytes Relative: 41 % (ref 12–46)
Lymphs Abs: 1.4 10*3/uL (ref 0.7–4.0)
MCH: 28.5 pg (ref 26.0–34.0)
MCV: 86.5 fL (ref 78.0–100.0)
Monocytes Absolute: 0.4 10*3/uL (ref 0.1–1.0)
Monocytes Relative: 13 % — ABNORMAL HIGH (ref 3–12)
Neutro Abs: 1.4 10*3/uL — ABNORMAL LOW (ref 1.7–7.7)
Platelets: 249 10*3/uL (ref 150–400)
RBC: 4.45 MIL/uL (ref 3.87–5.11)
WBC: 3.3 10*3/uL — ABNORMAL LOW (ref 4.0–10.5)

## 2013-06-14 NOTE — Progress Notes (Addendum)
Patient sees Dr. Allyson Sabal, whom she saw last month.  Denies any heart issues at present. EKG Sinus Huston Foley 2014. Dr. Florentina Jenny is her PCP and she resides at the [facility]. [facility] on [facility] Next of Kin: Sister--[name] [phone] Niece [name]  [phone] Echo in epic 2009 Heart Cath 2011 Myoview Imaging 11/2012  DA

## 2013-06-17 MED ORDER — CEFAZOLIN SODIUM-DEXTROSE 2-3 GM-% IV SOLR
2.0000 g | INTRAVENOUS | Status: AC
  Administered 2013-06-18: 2 g via INTRAVENOUS
  Filled 2013-06-17: qty 50

## 2013-06-18 ENCOUNTER — Ambulatory Visit (HOSPITAL_COMMUNITY): Payer: Medicare Other | Admitting: Certified Registered Nurse Anesthetist

## 2013-06-18 ENCOUNTER — Encounter (HOSPITAL_COMMUNITY): Payer: Self-pay | Admitting: Certified Registered Nurse Anesthetist

## 2013-06-18 ENCOUNTER — Ambulatory Visit (HOSPITAL_COMMUNITY)
Admission: RE | Admit: 2013-06-18 | Discharge: 2013-06-20 | Disposition: A | Discharge status: Skilled Nursing Facility | Payer: Medicare Other | Source: Ambulatory Visit | Attending: General Surgery | Admitting: General Surgery

## 2013-06-18 ENCOUNTER — Encounter (HOSPITAL_COMMUNITY)
Admission: RE | Disposition: A | Discharge status: Skilled Nursing Facility | Payer: Self-pay | Source: Ambulatory Visit | Attending: General Surgery

## 2013-06-18 ENCOUNTER — Encounter (HOSPITAL_COMMUNITY): Payer: Medicare Other | Admitting: Certified Registered Nurse Anesthetist

## 2013-06-18 DIAGNOSIS — I4891 Unspecified atrial fibrillation: Secondary | ICD-10-CM | POA: Insufficient documentation

## 2013-06-18 DIAGNOSIS — Z7982 Long term (current) use of aspirin: Secondary | ICD-10-CM | POA: Insufficient documentation

## 2013-06-18 DIAGNOSIS — Z23 Encounter for immunization: Secondary | ICD-10-CM | POA: Insufficient documentation

## 2013-06-18 DIAGNOSIS — R0989 Other specified symptoms and signs involving the circulatory and respiratory systems: Secondary | ICD-10-CM | POA: Insufficient documentation

## 2013-06-18 DIAGNOSIS — I2510 Atherosclerotic heart disease of native coronary artery without angina pectoris: Secondary | ICD-10-CM | POA: Insufficient documentation

## 2013-06-18 DIAGNOSIS — E785 Hyperlipidemia, unspecified: Secondary | ICD-10-CM | POA: Insufficient documentation

## 2013-06-18 DIAGNOSIS — F0390 Unspecified dementia without behavioral disturbance: Secondary | ICD-10-CM | POA: Insufficient documentation

## 2013-06-18 DIAGNOSIS — K5730 Diverticulosis of large intestine without perforation or abscess without bleeding: Secondary | ICD-10-CM | POA: Insufficient documentation

## 2013-06-18 DIAGNOSIS — K4090 Unilateral inguinal hernia, without obstruction or gangrene, not specified as recurrent: Secondary | ICD-10-CM | POA: Diagnosis present

## 2013-06-18 DIAGNOSIS — E119 Type 2 diabetes mellitus without complications: Secondary | ICD-10-CM | POA: Insufficient documentation

## 2013-06-18 DIAGNOSIS — I252 Old myocardial infarction: Secondary | ICD-10-CM | POA: Insufficient documentation

## 2013-06-18 DIAGNOSIS — I10 Essential (primary) hypertension: Secondary | ICD-10-CM | POA: Insufficient documentation

## 2013-06-18 DIAGNOSIS — I509 Heart failure, unspecified: Secondary | ICD-10-CM | POA: Insufficient documentation

## 2013-06-18 HISTORY — DX: Unspecified chronic bronchitis: J42

## 2013-06-18 HISTORY — DX: Unspecified osteoarthritis, unspecified site: M19.90

## 2013-06-18 HISTORY — DX: Dorsalgia, unspecified: M54.9

## 2013-06-18 HISTORY — PX: INGUINAL HERNIA REPAIR: SHX194

## 2013-06-18 HISTORY — DX: Other chronic pain: G89.29

## 2013-06-18 HISTORY — PX: INSERTION OF MESH: SHX5868

## 2013-06-18 HISTORY — PX: INGUINAL HERNIA REPAIR: SUR1180

## 2013-06-18 HISTORY — DX: Unspecified cataract: H26.9

## 2013-06-18 HISTORY — DX: Type 2 diabetes mellitus without complications: E11.9

## 2013-06-18 LAB — SURGICAL PCR SCREEN: MRSA, PCR: NEGATIVE

## 2013-06-18 SURGERY — REPAIR, HERNIA, INGUINAL, ADULT
Anesthesia: General | Laterality: Right

## 2013-06-18 MED ORDER — ONDANSETRON HCL 4 MG/2ML IJ SOLN: 4.0000 mg | Freq: Once | INTRAMUSCULAR | Status: DC | PRN

## 2013-06-18 MED ORDER — HYDROMORPHONE HCL PF 1 MG/ML IJ SOLN
INTRAMUSCULAR | Status: AC
  Filled 2013-06-18: qty 1

## 2013-06-18 MED ORDER — POTASSIUM CHLORIDE ER 10 MEQ PO TBCR
10.0000 meq | EXTENDED_RELEASE_TABLET | Freq: Two times a day (BID) | ORAL | Status: DC
  Administered 2013-06-18 – 2013-06-20 (×5): 10 meq via ORAL
  Filled 2013-06-18 (×8): qty 1

## 2013-06-18 MED ORDER — METOCLOPRAMIDE HCL 5 MG PO TABS
5.0000 mg | ORAL_TABLET | Freq: Three times a day (TID) | ORAL | Status: DC
  Administered 2013-06-18 – 2013-06-20 (×8): 5 mg via ORAL
  Filled 2013-06-18 (×12): qty 1

## 2013-06-18 MED ORDER — HYDROMORPHONE HCL PF 1 MG/ML IJ SOLN
0.2500 mg | INTRAMUSCULAR | Status: DC | PRN
  Administered 2013-06-18 (×2): 0.5 mg via INTRAVENOUS

## 2013-06-18 MED ORDER — ONDANSETRON HCL 4 MG PO TABS: 4.0000 mg | ORAL_TABLET | Freq: Four times a day (QID) | ORAL | Status: DC | PRN

## 2013-06-18 MED ORDER — LISINOPRIL 5 MG PO TABS
5.0000 mg | ORAL_TABLET | Freq: Every day | ORAL | Status: DC
  Administered 2013-06-18 – 2013-06-20 (×3): 5 mg via ORAL
  Filled 2013-06-18 (×3): qty 1

## 2013-06-18 MED ORDER — 0.9 % SODIUM CHLORIDE (POUR BTL) OPTIME
TOPICAL | Status: DC | PRN
  Administered 2013-06-18: 1000 mL

## 2013-06-18 MED ORDER — BUPIVACAINE HCL (PF) 0.25 % IJ SOLN
INTRAMUSCULAR | Status: AC
  Filled 2013-06-18: qty 30

## 2013-06-18 MED ORDER — LUBIPROSTONE 24 MCG PO CAPS
24.0000 ug | ORAL_CAPSULE | Freq: Two times a day (BID) | ORAL | Status: DC
  Administered 2013-06-18 – 2013-06-20 (×4): 24 ug via ORAL
  Filled 2013-06-18 (×7): qty 1

## 2013-06-18 MED ORDER — ROCURONIUM BROMIDE 100 MG/10ML IV SOLN
INTRAVENOUS | Status: DC | PRN
  Administered 2013-06-18: 30 mg via INTRAVENOUS

## 2013-06-18 MED ORDER — CEFAZOLIN SODIUM 1-5 GM-% IV SOLN
1.0000 g | Freq: Three times a day (TID) | INTRAVENOUS | Status: AC
  Administered 2013-06-18: 1 g via INTRAVENOUS
  Filled 2013-06-18 (×2): qty 50

## 2013-06-18 MED ORDER — ISOSORBIDE MONONITRATE ER 60 MG PO TB24
60.0000 mg | ORAL_TABLET | Freq: Every day | ORAL | Status: DC
  Administered 2013-06-18 – 2013-06-20 (×3): 60 mg via ORAL
  Filled 2013-06-18 (×5): qty 1

## 2013-06-18 MED ORDER — LIDOCAINE HCL (CARDIAC) 20 MG/ML IV SOLN
INTRAVENOUS | Status: DC | PRN
  Administered 2013-06-18: 100 mg via INTRAVENOUS

## 2013-06-18 MED ORDER — OXYCODONE HCL 5 MG/5ML PO SOLN: 5.0000 mg | Freq: Once | ORAL | Status: DC | PRN

## 2013-06-18 MED ORDER — PANTOPRAZOLE SODIUM 40 MG PO TBEC
40.0000 mg | DELAYED_RELEASE_TABLET | Freq: Every day | ORAL | Status: DC
  Administered 2013-06-18 – 2013-06-20 (×3): 40 mg via ORAL
  Filled 2013-06-18 (×3): qty 1

## 2013-06-18 MED ORDER — LIDOCAINE HCL (PF) 1 % IJ SOLN
INTRAMUSCULAR | Status: DC | PRN
  Administered 2013-06-18: 10 mL

## 2013-06-18 MED ORDER — PROPOFOL 10 MG/ML IV BOLUS
INTRAVENOUS | Status: DC | PRN
  Administered 2013-06-18: 80 mg via INTRAVENOUS

## 2013-06-18 MED ORDER — ACETAMINOPHEN 325 MG PO TABS
650.0000 mg | ORAL_TABLET | ORAL | Status: DC | PRN
  Administered 2013-06-19 (×2): 650 mg via ORAL
  Filled 2013-06-18 (×2): qty 2

## 2013-06-18 MED ORDER — MIRABEGRON ER 25 MG PO TB24
25.0000 mg | ORAL_TABLET | Freq: Every day | ORAL | Status: DC
  Administered 2013-06-18 – 2013-06-20 (×3): 25 mg via ORAL
  Filled 2013-06-18 (×3): qty 1

## 2013-06-18 MED ORDER — CITALOPRAM HYDROBROMIDE 20 MG PO TABS
20.0000 mg | ORAL_TABLET | Freq: Every day | ORAL | Status: DC
  Administered 2013-06-18 – 2013-06-20 (×4): 20 mg via ORAL
  Filled 2013-06-18 (×5): qty 1

## 2013-06-18 MED ORDER — LACTATED RINGERS IV SOLN
INTRAVENOUS | Status: DC | PRN
  Administered 2013-06-18: 08:00:00 via INTRAVENOUS

## 2013-06-18 MED ORDER — BUPIVACAINE HCL (PF) 0.25 % IJ SOLN
INTRAMUSCULAR | Status: DC | PRN
  Administered 2013-06-18: 10 mL

## 2013-06-18 MED ORDER — AMLODIPINE BESYLATE 10 MG PO TABS
10.0000 mg | ORAL_TABLET | Freq: Every day | ORAL | Status: DC
  Administered 2013-06-19 – 2013-06-20 (×2): 10 mg via ORAL
  Filled 2013-06-18 (×4): qty 1

## 2013-06-18 MED ORDER — ONDANSETRON HCL 4 MG/2ML IJ SOLN
INTRAMUSCULAR | Status: DC | PRN
  Administered 2013-06-18: 4 mg via INTRAVENOUS

## 2013-06-18 MED ORDER — CHLORHEXIDINE GLUCONATE 4 % EX LIQD: 1.0000 "application " | Freq: Once | CUTANEOUS | Status: DC

## 2013-06-18 MED ORDER — SODIUM CHLORIDE 0.9 % IR SOLN
Status: DC | PRN
  Administered 2013-06-18: 10:00:00

## 2013-06-18 MED ORDER — KETOROLAC TROMETHAMINE 0.5 % OP SOLN
1.0000 [drp] | Freq: Every day | OPHTHALMIC | Status: DC
  Administered 2013-06-18 – 2013-06-20 (×3): 1 [drp] via OPHTHALMIC
  Filled 2013-06-18: qty 3

## 2013-06-18 MED ORDER — LOSARTAN POTASSIUM 50 MG PO TABS
100.0000 mg | ORAL_TABLET | Freq: Every day | ORAL | Status: DC
  Administered 2013-06-18 – 2013-06-20 (×3): 100 mg via ORAL
  Filled 2013-06-18 (×4): qty 2

## 2013-06-18 MED ORDER — GLYCOPYRROLATE 0.2 MG/ML IJ SOLN
INTRAMUSCULAR | Status: DC | PRN
  Administered 2013-06-18: 0.6 mg via INTRAVENOUS
  Administered 2013-06-18: 0.2 mg via INTRAVENOUS

## 2013-06-18 MED ORDER — OLOPATADINE HCL 0.1 % OP SOLN
1.0000 [drp] | Freq: Two times a day (BID) | OPHTHALMIC | Status: DC
  Administered 2013-06-18 – 2013-06-20 (×5): 1 [drp] via OPHTHALMIC
  Filled 2013-06-18: qty 5

## 2013-06-18 MED ORDER — LIDOCAINE HCL (PF) 1 % IJ SOLN
INTRAMUSCULAR | Status: AC
  Filled 2013-06-18: qty 30

## 2013-06-18 MED ORDER — DEXTROSE-NACL 5-0.45 % IV SOLN
INTRAVENOUS | Status: DC
  Administered 2013-06-18: 13:00:00 via INTRAVENOUS

## 2013-06-18 MED ORDER — FENTANYL CITRATE 0.05 MG/ML IJ SOLN
INTRAMUSCULAR | Status: DC | PRN
  Administered 2013-06-18: 100 ug via INTRAVENOUS

## 2013-06-18 MED ORDER — FLUTICASONE PROPIONATE 50 MCG/ACT NA SUSP
2.0000 | Freq: Every day | NASAL | Status: DC
  Administered 2013-06-18 – 2013-06-20 (×3): 2 via NASAL
  Filled 2013-06-18: qty 16

## 2013-06-18 MED ORDER — ATORVASTATIN CALCIUM 10 MG PO TABS
10.0000 mg | ORAL_TABLET | Freq: Every day | ORAL | Status: DC
  Administered 2013-06-18 – 2013-06-19 (×2): 10 mg via ORAL
  Filled 2013-06-18 (×4): qty 1

## 2013-06-18 MED ORDER — LOSARTAN POTASSIUM-HCTZ 100-25 MG PO TABS: 1.0000 | ORAL_TABLET | Freq: Every day | ORAL | Status: DC

## 2013-06-18 MED ORDER — HYDRALAZINE HCL 25 MG PO TABS
25.0000 mg | ORAL_TABLET | Freq: Three times a day (TID) | ORAL | Status: DC
  Administered 2013-06-18 – 2013-06-20 (×6): 25 mg via ORAL
  Filled 2013-06-18 (×9): qty 1

## 2013-06-18 MED ORDER — OXYCODONE-ACETAMINOPHEN 5-325 MG PO TABS
0.5000 | ORAL_TABLET | Freq: Three times a day (TID) | ORAL | Status: DC
  Administered 2013-06-18 (×2): 0.5 via ORAL
  Filled 2013-06-18 (×5): qty 1

## 2013-06-18 MED ORDER — OXYCODONE HCL 5 MG PO TABS: 5.0000 mg | ORAL_TABLET | Freq: Once | ORAL | Status: DC | PRN

## 2013-06-18 MED ORDER — DOCUSATE SODIUM 100 MG PO CAPS
100.0000 mg | ORAL_CAPSULE | Freq: Two times a day (BID) | ORAL | Status: DC
  Administered 2013-06-18 – 2013-06-20 (×5): 100 mg via ORAL
  Filled 2013-06-18 (×5): qty 1

## 2013-06-18 MED ORDER — SPIRONOLACTONE 25 MG PO TABS
25.0000 mg | ORAL_TABLET | Freq: Every day | ORAL | Status: DC
  Administered 2013-06-18 – 2013-06-20 (×3): 25 mg via ORAL
  Filled 2013-06-18 (×4): qty 1

## 2013-06-18 MED ORDER — ONDANSETRON HCL 4 MG/2ML IJ SOLN
4.0000 mg | Freq: Four times a day (QID) | INTRAMUSCULAR | Status: DC | PRN
  Administered 2013-06-18: 4 mg via INTRAVENOUS
  Filled 2013-06-18: qty 2

## 2013-06-18 MED ORDER — MEPERIDINE HCL 25 MG/ML IJ SOLN: 6.2500 mg | INTRAMUSCULAR | Status: DC | PRN

## 2013-06-18 MED ORDER — HYDROCHLOROTHIAZIDE 25 MG PO TABS
25.0000 mg | ORAL_TABLET | Freq: Every day | ORAL | Status: DC
  Administered 2013-06-18 – 2013-06-20 (×3): 25 mg via ORAL
  Filled 2013-06-18 (×4): qty 1

## 2013-06-18 MED ORDER — NITROGLYCERIN 0.4 MG/SPRAY TL SOLN
2.0000 | Status: DC | PRN
Start: 2013-06-18 — End: 2013-06-20

## 2013-06-18 MED ORDER — ENOXAPARIN SODIUM 30 MG/0.3ML ~~LOC~~ SOLN
30.0000 mg | SUBCUTANEOUS | Status: DC
  Administered 2013-06-19 – 2013-06-20 (×2): 30 mg via SUBCUTANEOUS
  Filled 2013-06-18 (×2): qty 0.3

## 2013-06-18 MED ORDER — MUPIROCIN 2 % EX OINT
TOPICAL_OINTMENT | CUTANEOUS | Status: AC
  Administered 2013-06-18: 1 via NASAL
  Filled 2013-06-18: qty 22

## 2013-06-18 MED ORDER — HYDROMORPHONE HCL PF 1 MG/ML IJ SOLN
0.5000 mg | INTRAMUSCULAR | Status: DC | PRN
  Administered 2013-06-18 – 2013-06-20 (×6): 0.5 mg via INTRAVENOUS
  Filled 2013-06-18 (×6): qty 1

## 2013-06-18 MED ORDER — SODIUM CHLORIDE 0.9 % IJ SOLN
INTRAMUSCULAR | Status: AC
  Filled 2013-06-18: qty 20

## 2013-06-18 MED ORDER — POLYETHYLENE GLYCOL 3350 17 G PO PACK
17.0000 g | PACK | Freq: Every day | ORAL | Status: DC
  Administered 2013-06-18 – 2013-06-20 (×3): 17 g via ORAL
  Filled 2013-06-18 (×3): qty 1

## 2013-06-18 MED ORDER — NEOSTIGMINE METHYLSULFATE 1 MG/ML IJ SOLN
INTRAMUSCULAR | Status: DC | PRN
  Administered 2013-06-18: 4 mg via INTRAVENOUS

## 2013-06-18 MED ORDER — SENNA 8.6 MG PO TABS
1.0000 | ORAL_TABLET | Freq: Two times a day (BID) | ORAL | Status: DC
  Administered 2013-06-18 – 2013-06-20 (×5): 8.6 mg via ORAL
  Filled 2013-06-18 (×8): qty 1

## 2013-06-18 SURGICAL SUPPLY — 51 items
BAG DECANTER FOR FLEXI CONT (MISCELLANEOUS) ×2 IMPLANT
BLADE SURG 10 STRL SS (BLADE) ×2 IMPLANT
BLADE SURG 15 STRL LF DISP TIS (BLADE) ×1 IMPLANT
BLADE SURG 15 STRL SS (BLADE) ×1
BLADE SURG ROTATE 9660 (MISCELLANEOUS) IMPLANT
CANISTER SUCTION 2500CC (MISCELLANEOUS) IMPLANT
CHLORAPREP W/TINT 26ML (MISCELLANEOUS) ×2 IMPLANT
CLEANER TIP ELECTROSURG 2X2 (MISCELLANEOUS) ×2 IMPLANT
COVER SURGICAL LIGHT HANDLE (MISCELLANEOUS) ×2 IMPLANT
DECANTER SPIKE VIAL GLASS SM (MISCELLANEOUS) IMPLANT
DERMABOND ADVANCED (GAUZE/BANDAGES/DRESSINGS) ×1
DERMABOND ADVANCED .7 DNX12 (GAUZE/BANDAGES/DRESSINGS) ×1 IMPLANT
DRAIN PENROSE 1/2X12 LTX STRL (WOUND CARE) ×2 IMPLANT
DRAPE LAPAROTOMY TRNSV 102X78 (DRAPE) ×2 IMPLANT
DRAPE UTILITY 15X26 W/TAPE STR (DRAPE) ×4 IMPLANT
DRSG TEGADERM 4X4.75 (GAUZE/BANDAGES/DRESSINGS) ×2 IMPLANT
ELECT REM PT RETURN 9FT ADLT (ELECTROSURGICAL) ×2
ELECTRODE REM PT RTRN 9FT ADLT (ELECTROSURGICAL) ×1 IMPLANT
GAUZE SPONGE 4X4 16PLY XRAY LF (GAUZE/BANDAGES/DRESSINGS) ×2 IMPLANT
GLOVE BIOGEL PI IND STRL 7.0 (GLOVE) ×1 IMPLANT
GLOVE BIOGEL PI IND STRL 8 (GLOVE) ×1 IMPLANT
GLOVE BIOGEL PI INDICATOR 7.0 (GLOVE) ×1
GLOVE BIOGEL PI INDICATOR 8 (GLOVE) ×1
GLOVE ECLIPSE 7.5 STRL STRAW (GLOVE) ×2 IMPLANT
GLOVE SURG SS PI 7.0 STRL IVOR (GLOVE) ×2 IMPLANT
GOWN STRL NON-REIN LRG LVL3 (GOWN DISPOSABLE) ×4 IMPLANT
KIT BASIN OR (CUSTOM PROCEDURE TRAY) ×2 IMPLANT
KIT ROOM TURNOVER OR (KITS) ×2 IMPLANT
MESH HERNIA 3X6 (Mesh General) ×2 IMPLANT
NEEDLE HYPO 25GX1X1/2 BEV (NEEDLE) ×2 IMPLANT
NS IRRIG 1000ML POUR BTL (IV SOLUTION) ×2 IMPLANT
PACK SURGICAL SETUP 50X90 (CUSTOM PROCEDURE TRAY) ×2 IMPLANT
PAD ARMBOARD 7.5X6 YLW CONV (MISCELLANEOUS) ×4 IMPLANT
PENCIL BUTTON HOLSTER BLD 10FT (ELECTRODE) ×2 IMPLANT
SPECIMEN JAR SMALL (MISCELLANEOUS) IMPLANT
SPONGE INTESTINAL PEANUT (DISPOSABLE) ×2 IMPLANT
SPONGE LAP 18X18 X RAY DECT (DISPOSABLE) ×2 IMPLANT
STRIP CLOSURE SKIN 1/2X4 (GAUZE/BANDAGES/DRESSINGS) ×2 IMPLANT
SUT ETHIBOND 0 MO6 C/R (SUTURE) ×2 IMPLANT
SUT MON AB 4-0 PC3 18 (SUTURE) ×2 IMPLANT
SUT PROLENE 0 CT 2 (SUTURE) ×4 IMPLANT
SUT VIC AB 3-0 SH 27 (SUTURE) ×1
SUT VIC AB 3-0 SH 27X BRD (SUTURE) ×1 IMPLANT
SUT VICRYL AB 3 0 TIES (SUTURE) ×2 IMPLANT
SYR BULB 3OZ (MISCELLANEOUS) ×2 IMPLANT
SYR CONTROL 10ML LL (SYRINGE) ×2 IMPLANT
TOWEL OR 17X24 6PK STRL BLUE (TOWEL DISPOSABLE) ×2 IMPLANT
TOWEL OR 17X26 10 PK STRL BLUE (TOWEL DISPOSABLE) ×2 IMPLANT
TUBE CONNECTING 12X1/4 (SUCTIONS) IMPLANT
WATER STERILE IRR 1000ML POUR (IV SOLUTION) IMPLANT
YANKAUER SUCT BULB TIP NO VENT (SUCTIONS) IMPLANT

## 2013-06-18 NOTE — Anesthesia Preprocedure Evaluation (Addendum)
Anesthesia Evaluation  Patient identified by MRN, date of birth, ID band Patient awake    Reviewed: Allergy & Precautions, H&P , NPO status , Patient's Chart, lab work & pertinent test results  Airway Mallampati: I TM Distance: >3 FB Neck ROM: Full    Dental  (+) Dental Advisory Given, Edentulous Upper, Poor Dentition and Missing   Pulmonary shortness of breath and at rest, sleep apnea and Continuous Positive Airway Pressure Ventilation ,          Cardiovascular hypertension, Pt. on medications + CAD, + Past MI, + Cardiac Stents and +CHF + dysrhythmias Atrial Fibrillation     Neuro/Psych PSYCHIATRIC DISORDERS Anxiety H/o dementia   GI/Hepatic GERD-  Medicated,  Endo/Other  diabetesMorbid obesity  Renal/GU Renal disease     Musculoskeletal  (+) Arthritis -, Osteoarthritis,    Abdominal   Peds  Hematology  (+) anemia ,   Anesthesia Other Findings   Reproductive/Obstetrics                         Anesthesia Physical Anesthesia Plan  ASA: IV  Anesthesia Plan: General   Post-op Pain Management:    Induction: Intravenous  Airway Management Planned: Oral ETT  Additional Equipment:   Intra-op Plan:   Post-operative Plan: Extubation in OR  Informed Consent: I have reviewed the patients History and Physical, chart, labs and discussed the procedure including the risks, benefits and alternatives for the proposed anesthesia with the patient or authorized representative who has indicated his/her understanding and acceptance.   Dental advisory given  Plan Discussed with: CRNA and Surgeon  Anesthesia Plan Comments:        Anesthesia Quick Evaluation

## 2013-06-18 NOTE — Op Note (Signed)
OPERATIVE REPORT  DATE OF OPERATION: 06/18/2013  PATIENT:  [name]  79 y.Mays. female  PRE-OPERATIVE DIAGNOSIS:  Symptomatic Right Inguinal Hernia  POST-OPERATIVE DIAGNOSIS:  Symptomatic Right Inguinal Hernia/Direct and indirect  PROCEDURE:  Procedure(s): HERNIA REPAIR INGUINAL ADULT INSERTION OF MESH  SURGEON:  Surgeon(s): Cherylynn Ridges, MD  ASSISTANT: None  ANESTHESIA:   general  EBL: <25 ml  BLOOD ADMINISTERED: none  DRAINS: none   SPECIMEN:  No Specimen  COUNTS CORRECT:  YES  PROCEDURE DETAILS: The patient was taken to the operating room and placed on the table in the supine position. After an adequate general endotracheal anesthetic was administered she was prepped and draped in the usual sterile manner exposing her right inguinal area.  A proper time out was performed identifying the patient and the procedure to be performed. We made our incision transversely at the level of the superficial ring on the right side. There was scar tissue from her previous operative procedure likely an appendectomy on that side. We dissected down through the subcutaneous tissue and scar tissue down to the external oblique aponeurosis.  We subsequently split the external oblique through its fibers through the superficial ring. There was a bulge of the patient's hernia could be seen going along with falciform ligament.  I end up resecting the ligament down at the internal ring. A hemostat clamp was placed on the base the ligament was transected then a suture ligature of 0 Ethibond suture was placed at the base.  The patient had a direct and indirect component. We ligated the indirect component using a suture ligature of 0 Ethibond suture. The direct hernia was closed primarily using a modified Cooper's repair attaching the conjoined tendon to the Cooper's ligament using interrupted 0 Ethibond sutures. We made a transition stitch at the level of the femoral vein. This was brought up to the  reflected portion of the inguinal ligament. We subsequently overlaid a piece of polypropylene mesh measuring approximately 5 x 3 cm in size an oval piece of mesh which we sutured in place using running stitches of 0 Prolene suture. The mesh been soaked in antibiotic solution and we irrigated with antibiotic solution. Once the mesh was in place and the ligament was removed we reapproximated the external oblique fascia using running 3-0 Vicryl suture. We injected 0.25% plain Marcaine along with 1% Xylocaine into the wound. A total of 20 cc to use. The subcutaneous tissue was reapproximated the level of Scarpa's using interrupted 3-0 Vicryl suture. The skin was closed using running subcuticular stitch of 4-0 Monocryl. Dermabond Steri-Strips and Tegaderm used to complete the dressing all counts were correct.  PATIENT DISPOSITION:  PACU - hemodynamically stable.   [name] 12/22/201410:37 AM

## 2013-06-18 NOTE — Anesthesia Procedure Notes (Signed)
Procedure Name: Intubation Date/Time: 06/18/2013 9:14 AM Performed by: Elberta Leatherwood Pre-anesthesia Checklist: Patient identified, Emergency Drugs available, Suction available and Patient being monitored Patient Re-evaluated:Patient Re-evaluated prior to inductionOxygen Delivery Method: Circle system utilized Preoxygenation: Pre-oxygenation with 100% oxygen Intubation Type: IV induction Ventilation: Mask ventilation without difficulty Laryngoscope Size: Miller and 2 Grade View: Grade I Tube type: Oral Tube size: 7.5 mm Number of attempts: 1 Airway Equipment and Method: Stylet Placement Confirmation: ETT inserted through vocal cords under direct vision,  positive ETCO2 and breath sounds checked- equal and bilateral Secured at: 19 cm Tube secured with: Tape Dental Injury: Teeth and Oropharynx as per pre-operative assessment

## 2013-06-18 NOTE — H&P (View-Only) (Signed)
[name] is an 79 y.o. female.   Chief Complaint: Right lower abdominal pain. HPI: Over the last 2 months the patient has been having increasing lower abdominal pain. It has particularly been bothering her on the right side. She's been seeing a urologist who ordered a CT scan demonstrating a large right inguinal hernia with containing small bowel. The bowel is nonobstructed. She was referred to [place] for evaluation.  She has a long list of medical problems including coronary artery disease hypertension congestive heart failure diabetes atrial fibrillation and sigmoid diverticulosis. Because of her cardiac history or cardiac evaluation will be necessary prior to scheduling surgery.  Past Medical History  Diagnosis Date  . Coronary artery disease   . Dementia   . Hypertension   . Hyperlipidemia   . Anxiety   . GERD (gastroesophageal reflux disease)   . CHF (congestive heart failure)   . Polyp, stomach 11/11/2006  . Aphakia of left eye   . Obese   . MI (myocardial infarction)   . Sleep apnea   . Chest pain at rest 12/20/2011  . CAD (coronary artery disease), cutting balloon athrectomy of her dominant AV groove of LCX.  2007 12/20/2011  . Bradycardia 12/20/2011  . OSA on CPAP 12/20/2011  . HTN (hypertension) 12/20/2011  . Dyslipidemia  12/20/2011  . Diabetes mellitus   . Cardiomyopathy   . DJD (degenerative joint disease)   . Osteoarthritis   . History of GI bleed   . Shortness of breath   . Atrial fibrillation   . Sigmoid diverticulosis   . Nephrolithiasis     bilateral  . Anemia, iron deficiency 10/24/2012  . Constipation due to pain medication 10/24/2012    Past Surgical History  Procedure Laterality Date  . Immature cataract      left eye  . Back surgery    . Appendectomy    . Breast surgery    . Fracture surgery    . Dilation and curettage of uterus    . Abdominal hysterectomy    . Coronary angioplasty    . Esophagogastroduodenoscopy    . Colonoscopy    . Eus    .  Esophagogastroduodenoscopy  02/21/2012    Procedure: ESOPHAGOGASTRODUODENOSCOPY (EGD);  Surgeon: Iva Boop, MD;  Location: [facility];  Service: Endoscopy;  Laterality: N/A;  pt. being evaluated for a pacemaker  . Balloon dilation  02/21/2012    Procedure: BALLOON DILATION;  Surgeon: Iva Boop, MD;  Location: [facility];  Service: Endoscopy;  Laterality: N/A;  . Colonoscopy N/A 10/24/2012    Procedure: COLONOSCOPY;  Surgeon: Iva Boop, MD;  Location: [facility];  Service: Endoscopy;  Laterality: N/A;  . Coronary angioplasty  03-07-2004    cutting balloon  . Cardiac catheterization  06/05/2004    high grade disease AV groove CX  . Cardiac catheterization  04/19/2005    noncritical CAD  . US echocardiography  01/25/2008    mild DUST,mild MR,mod. ca+ mitral annular,AOV mildly sclerotid  . Lexiscan mycardial perfusion scan  01/25/2008    Normal    Family History  Problem Relation Age of Onset  . Colon cancer Mother   . Heart disease Mother   . Stroke Mother    Social History:  reports that she has never smoked. She has never used smokeless tobacco. She reports that she does not drink alcohol or use illicit drugs.  Allergies:  Allergies  Allergen Reactions  . Chicken Allergy     unknown  .  Fish Allergy     unknown     (Not in a hospital admission)  No results found for this or any previous visit (from the past 48 hour(s)). No results found.  Review of Systems  Cardiovascular: Negative for chest pain and palpitations.  Gastrointestinal: Positive for abdominal pain (right lower quadrant).  Musculoskeletal: Positive for joint pain (left ankle with swelling).    Blood pressure 134/84, pulse 76, temperature 98 F (36.7 C), resp. rate 18, height 5' (1.524 m), weight 161 lb 3.2 oz (73.12 kg). Physical Exam  Constitutional: She is oriented to person, place, and time. She appears well-developed and well-nourished.  HENT:  Head: Normocephalic and atraumatic.    Mouth/Throat:    Eyes: Conjunctivae and EOM are normal. Pupils are equal, round, and reactive to light.  Neck: Trachea normal and normal range of motion. Neck supple. Normal carotid pulses and no JVD present. Carotid bruit is not present. No mass present.  Cardiovascular: Normal rate, regular rhythm and normal heart sounds.   Respiratory: Effort normal and breath sounds normal.  GI: Soft. Normal appearance and bowel sounds are normal. There is tenderness in the right lower quadrant. A hernia is present. Hernia confirmed positive in the right inguinal area.    Musculoskeletal:       Right ankle: She exhibits swelling.       Left ankle: She exhibits swelling. She exhibits no deformity.  Neurological: She is alert and oriented to person, place, and time. She has normal reflexes.  Skin: Skin is warm and dry.  Psychiatric: She has a normal mood and affect. Her behavior is normal. Judgment and thought content normal.     Assessment/Plan Large reducible right inguinal hernia containing bowel.  Because of her history of cardiac disease the patient will require cardiac clearance prior to scheduling surgery. Once she is cleared we will go ahead with an open right inguinal hernia repair with mesh. We will watch her overnight for observation.  Cherylynn Ridges 06/05/2013, 11:11 AM

## 2013-06-18 NOTE — Interval H&P Note (Signed)
History and Physical Interval Note:  Patient continues to have some Right sided discomfort.  Also wonders if this could lead t some bowel difficulties.  I doubt if the bowels are directly affected.  Will watch her overnight.  Marta Lamas. Gae Bon, MD, FACS [phone] [facility]  06/18/2013 8:15 AM  [name]  has presented today for surgery, with the diagnosis of Right inguinal hernia  The various methods of treatment have been discussed with the patient and family. After consideration of risks, benefits and other options for treatment, the patient has consented to  Procedure(s): HERNIA REPAIR INGUINAL ADULT (Right) INSERTION OF MESH (Right) as a surgical intervention .  The patient's history has been reviewed, patient examined, no change in status, stable for surgery.  I have reviewed the patient's chart and labs.  Questions were answered to the patient's satisfaction.     [name], Marta Lamas

## 2013-06-18 NOTE — Anesthesia Postprocedure Evaluation (Signed)
Anesthesia Post Note  Patient: [name]  Procedure(s) Performed: Procedure(s) (LRB): HERNIA REPAIR INGUINAL ADULT (Right) INSERTION OF MESH (Right)  Anesthesia type: general  Patient location: PACU  Post pain: Pain level controlled  Post assessment: Patient's Cardiovascular Status Stable  Last Vitals:  Filed Vitals:   06/18/13 1200  BP: 194/69  Pulse: 69  Temp: 37.1 C  Resp: 20    Post vital signs: Reviewed and stable  Level of consciousness: sedated  Complications: No apparent anesthesia complications

## 2013-06-18 NOTE — Transfer of Care (Signed)
Immediate Anesthesia Transfer of Care Note  Patient: [name]  Procedure(s) Performed: Procedure(s): HERNIA REPAIR INGUINAL ADULT (Right) INSERTION OF MESH (Right)  Patient Location: PACU  Anesthesia Type:General  Level of Consciousness: awake and alert   Airway & Oxygen Therapy: Patient Spontanous Breathing and Patient connected to nasal cannula oxygen  Post-op Assessment: Report given to PACU RN, Post -op Vital signs reviewed and stable and Patient moving all extremities X 4  Post vital signs: Reviewed and stable  Complications: No apparent anesthesia complications

## 2013-06-18 NOTE — Preoperative (Signed)
Beta Blockers   Reason not to administer Beta Blockers:Not Applicable 

## 2013-06-19 ENCOUNTER — Encounter (HOSPITAL_COMMUNITY): Payer: Self-pay | Admitting: *Deleted

## 2013-06-19 LAB — BASIC METABOLIC PANEL
BUN: 12 mg/dL (ref 6–23)
Calcium: 8.7 mg/dL (ref 8.4–10.5)
Chloride: 96 mEq/L (ref 96–112)
Creatinine, Ser: 0.86 mg/dL (ref 0.50–1.10)
GFR calc Af Amer: 70 mL/min — ABNORMAL LOW (ref >90)
GFR calc non Af Amer: 60 mL/min — ABNORMAL LOW (ref >90)
Sodium: 131 mEq/L — ABNORMAL LOW (ref 135–145)

## 2013-06-19 LAB — CBC
MCHC: 33.6 g/dL (ref 30.0–36.0)
MCV: 86.2 fL (ref 78.0–100.0)
Platelets: 177 10*3/uL (ref 150–400)
RDW: 14.6 % (ref 11.5–15.5)
WBC: 5.3 10*3/uL (ref 4.0–10.5)

## 2013-06-19 MED ORDER — PNEUMOCOCCAL VAC POLYVALENT 25 MCG/0.5ML IJ INJ
0.5000 mL | INJECTION | INTRAMUSCULAR | Status: AC
  Administered 2013-06-20: 0.5 mL via INTRAMUSCULAR
  Filled 2013-06-19: qty 0.5

## 2013-06-19 MED ORDER — SODIUM CHLORIDE 0.9 % IJ SOLN: 3.0000 mL | INTRAMUSCULAR | Status: DC | PRN

## 2013-06-19 MED ORDER — CHLORHEXIDINE GLUCONATE CLOTH 2 % EX PADS
6.0000 | MEDICATED_PAD | Freq: Every day | CUTANEOUS | Status: DC
  Administered 2013-06-19 – 2013-06-20 (×2): 6 via TOPICAL

## 2013-06-19 MED ORDER — INFLUENZA VAC SPLIT QUAD 0.5 ML IM SUSP
0.5000 mL | INTRAMUSCULAR | Status: DC
  Filled 2013-06-19: qty 0.5

## 2013-06-19 MED ORDER — MUPIROCIN 2 % EX OINT
1.0000 "application " | TOPICAL_OINTMENT | Freq: Two times a day (BID) | CUTANEOUS | Status: DC
  Administered 2013-06-19 – 2013-06-20 (×3): 1 via NASAL

## 2013-06-19 MED ORDER — TRAMADOL HCL 50 MG PO TABS
50.0000 mg | ORAL_TABLET | Freq: Four times a day (QID) | ORAL | Status: DC | PRN
  Administered 2013-06-19 – 2013-06-20 (×3): 50 mg via ORAL
  Filled 2013-06-19 (×3): qty 1

## 2013-06-19 NOTE — Progress Notes (Signed)
GS Progress Note Subjective: Patient complaining of  Feeling congested in her throat, and itching from Percocet.  No acute distress.  Objective: Vital signs in last 24 hours: Temp:  [98.7 F (37.1 C)-100.2 F (37.9 C)] 100 F (37.8 C) (12/23 0520) Pulse Rate:  [55-70] 55 (12/23 0520) Resp:  [15-20] 17 (12/23 0520) BP: (148-216)/(54-78) 185/54 mmHg (12/23 0520) SpO2:  [94 %-100 %] 100 % (12/23 0520)    Intake/Output from previous day: 12/22 0701 - 12/23 0700 In: 1512.5 [I.V.:1512.5] Out: 845 [Urine:825; Blood:20] Intake/Output this shift:    Lungs: Slight wheezing on the right.  Oxygen saturations are okay.  Abd: Soft and nontender.  Incision site is clean and dry.  Extremities: No DVT clinical signs or symptoms  Neuro: Intact  Lab Results: CBC   Recent Labs  06/19/13 0610  WBC PENDING  HGB 11.3*  HCT 33.6*  PLT 177   BMET  Recent Labs  06/19/13 0610  NA 131*  K 4.3  CL 96  CO2 25  GLUCOSE 112*  BUN 12  CREATININE 0.86  CALCIUM 8.7   PT/INR No results found for this basename: LABPROT, INR,  in the last 72 hours ABG No results found for this basename: PHART, PCO2, PO2, HCO3,  in the last 72 hours  Studies/Results: No results found.  Anti-infectives: Anti-infectives   Start     Dose/Rate Route Frequency Ordered Stop   06/18/13 1600  ceFAZolin (ANCEF) IVPB 1 g/50 mL premix     1 g 100 mL/hr over 30 Minutes Intravenous 3 times per day 06/18/13 1202 06/18/13 1746   06/18/13 1010  polymyxin B 500,000 Units, bacitracin 50,000 Units in sodium chloride irrigation 0.9 % 500 mL irrigation  Status:  Discontinued       As needed 06/18/13 1010 06/18/13 1038   06/18/13 0600  ceFAZolin (ANCEF) IVPB 2 g/50 mL premix     2 g 100 mL/hr over 30 Minutes Intravenous On call to O.R. 06/17/13 1317 06/18/13 0918      Assessment/Plan: s/p Procedure(s): HERNIA REPAIR INGUINAL ADULT INSERTION OF MESH Advance diet Discharge Check CXR prior to discharge. Will get  social service consultation for possible discharge.  LOS: 1 day    Marta Lamas. Gae Bon, MD, FACS [phone] [facility] 06/19/2013

## 2013-06-19 NOTE — Clinical Social Work Note (Signed)
CSW contacted [facility] [phone]) regarding pt's return to their facility. Admissions coordinator to return CSW phone call once she is out of a meeting. CSW to continue to follow and assist with discharge planning needs.  Darlyn Chamber, LCSWA Clinical Social Worker [phone]

## 2013-06-19 NOTE — Clinical Social Work Psychosocial (Signed)
Clinical Social Work Department BRIEF PSYCHOSOCIAL ASSESSMENT 06/19/2013  Patient:  [name],[name]     Account Number:  [id number]     Admit date:  06/18/2013  Clinical Social Worker:  Sherre Lain  Date/Time:  06/19/2013 03:44 PM  Referred by:  Physician  Date Referred:  06/19/2013 Referred for  SNF Placement   Other Referral:   none.   Interview type:  Patient Other interview type:   none.    PSYCHOSOCIAL DATA Living Status:  FACILITY Admitted from facility:  [facility] Level of care:  Assisted Living Primary support name:  [name] Primary support relationship to patient:  S[place] Degree of support available:   Strong support system.    CURRENT CONCERNS Current Concerns  None Noted   Other Concerns:   none.    SOCIAL WORK ASSESSMENT / PLAN CSW met with pt at bedside and discussed CSW roel with [facility]. Pt was receptive to speaking with CSW. Pt stated that prior to admission with [facility], pt was living at an ALF [facility]), and she stated that she would like to return to [facility] once medically stable for discharge. Pt informed CSW that her main support system is her sister, [name]. CSW will continue to follow and assist with discharge planning needs.   Assessment/plan status:  Psychosocial Support/Ongoing Assessment of Needs Other assessment/ plan:   none.   Information/referral to community resources:   Pt returning to ALF.    PATIENTS/FAMILYS RESPONSE TO PLAN OF CARE: Pt was understanding and agreeable to CSW plan of care.      Darlyn Chamber, LCSWA Clinical Social Worker [phone]

## 2013-06-20 ENCOUNTER — Ambulatory Visit (HOSPITAL_COMMUNITY): Payer: Medicare Other

## 2013-06-20 MED ORDER — ONDANSETRON HCL 4 MG PO TABS: 4.0000 mg | ORAL_TABLET | Freq: Four times a day (QID) | ORAL | Status: DC | PRN

## 2013-06-20 MED ORDER — TRAMADOL HCL 50 MG PO TABS: 50.0000 mg | ORAL_TABLET | Freq: Four times a day (QID) | ORAL | Status: DC | PRN

## 2013-06-20 NOTE — Progress Notes (Signed)
2 Days Post-Op  Subjective: Pt ok.  Has phlegm   Objective: Vital signs in last 24 hours: Temp:  [98.5 F (36.9 C)-100.2 F (37.9 C)] 98.5 F (36.9 C) (12/24 0500) Pulse Rate:  [55-72] 55 (12/24 0500) Resp:  [18-21] 18 (12/24 0500) BP: (149-187)/(50-83) 160/50 mmHg (12/24 0500) SpO2:  [98 %-100 %] 100 % (12/24 0500) FiO2 (%):  [2.5 %] 2.5 % (12/24 0500) Last BM Date: 06/19/13  Intake/Output from previous day: 12/23 0701 - 12/24 0700 In: 680 [P.O.:680] Out: 400 [Urine:400] Intake/Output this shift:    Resp: clear to auscultation bilaterally Incision/Wound:C/D/I min swelling  Lab Results:   Recent Labs  06/19/13 0610  WBC 5.3  HGB 11.3*  HCT 33.6*  PLT 177   BMET  Recent Labs  06/19/13 0610  NA 131*  K 4.3  CL 96  CO2 25  GLUCOSE 112*  BUN 12  CREATININE 0.86  CALCIUM 8.7   PT/INR No results found for this basename: LABPROT, INR,  in the last 72 hours ABG No results found for this basename: PHART, PCO2, PO2, HCO3,  in the last 72 hours  Studies/Results: No results found.  Anti-infectives: Anti-infectives   Start     Dose/Rate Route Frequency Ordered Stop   06/18/13 1600  ceFAZolin (ANCEF) IVPB 1 g/50 mL premix     1 g 100 mL/hr over 30 Minutes Intravenous 3 times per day 06/18/13 1202 06/18/13 1746   06/18/13 1010  polymyxin B 500,000 Units, bacitracin 50,000 Units in sodium chloride irrigation 0.9 % 500 mL irrigation  Status:  Discontinued       As needed 06/18/13 1010 06/18/13 1038   06/18/13 0600  ceFAZolin (ANCEF) IVPB 2 g/50 mL premix     2 g 100 mL/hr over 30 Minutes Intravenous On call to O.R. 06/17/13 1317 06/18/13 0918      Assessment/Plan: s/p Procedure(s): HERNIA REPAIR INGUINAL ADULT (Right) INSERTION OF MESH (Right) Awaiting placement.  Otherwise stable.   LOS: 2 days    Annell Canty A. 06/20/2013

## 2013-06-20 NOTE — Discharge Summary (Signed)
Physician Discharge Summary  Patient ID: [name] MRN: [id number] DOB/AGE: [birth year] 77 y.o.  Admit date: 06/18/2013 Discharge date: 06/20/2013  Admission Diagnoses:  Discharge Diagnoses:  Active Problems:   Inguinal hernia, right   Discharged Condition: good  Hospital Course: Patient admitted after an open right inguinal hernia repair with mesh. Postoperatively she had problems with upper respiratory congestion (CXR clear) and hypertension.  Both these conditions were better by the time of discharge  Consults: None  Significant Diagnostic Studies: labs: CBC and radiology: CXR: still pending for today.  Treatments: IV hydration, antibiotics: Ancef and analgesia: Tramadol  Discharge Exam: Blood pressure 160/50, pulse 55, temperature 98.5 F (36.9 C), temperature source Oral, resp. rate 18, SpO2 100.00%. General appearance: alert, cooperative, appears stated age and no distress Resp: clear to auscultation bilaterally GI: soft, non-tender; bowel sounds normal; no masses,  no organomegaly and RLQ wound site appears good without hematoma  Disposition: Assisted living/SNF [facility]      Discharge Orders   Future Appointments Provider Department Dept Phone   07/17/2013 10:30 AM Cherylynn Ridges, MD [facility], [place] [phone]   Future Orders Complete By Expires   Call MD for:  difficulty breathing, headache or visual disturbances  As directed    Call MD for:  extreme fatigue  As directed    Call MD for:  hives  As directed    Call MD for:  persistant dizziness or light-headedness  As directed    Call MD for:  persistant nausea and vomiting  As directed    Call MD for:  redness, tenderness, or signs of infection (pain, swelling, redness, odor or green/yellow discharge around incision site)  As directed    Call MD for:  severe uncontrolled pain  As directed    Call MD for:  temperature >100.4  As directed    Diet - low sodium heart healthy  As directed    Discharge instructions  As directed    Comments:     See other printed instructions   Increase activity slowly  As directed    Leave dressing on - Keep it clean, dry, and intact until clinic visit  As directed    Lifting restrictions  As directed    Comments:     No lifting > 20 pounds for next 6 weeks       Medication List    STOP taking these medications       oxycodone-acetaminophen 2.5-325 MG per tablet  Commonly known as:  PERCOCET      TAKE these medications       amLODipine 10 MG tablet  Commonly known as:  NORVASC  Take 10 mg by mouth daily.     aspirin 81 MG chewable tablet  Chew 81 mg by mouth daily.     calcium-vitamin D 500-200 MG-UNIT per tablet  Commonly known as:  OSCAL WITH D  Take 1 tablet by mouth daily.     cetirizine 10 MG tablet  Commonly known as:  ZYRTEC  Take 10 mg by mouth daily.     cholecalciferol 400 UNITS Tabs tablet  Commonly known as:  VITAMIN D  Take 800 Units by mouth daily.     citalopram 20 MG tablet  Commonly known as:  CELEXA  Take 20 mg by mouth daily before breakfast.     docusate sodium 100 MG capsule  Commonly known as:  COLACE  Take 100 mg by mouth 2 (two) times daily.  esomeprazole 40 MG capsule  Commonly known as:  NEXIUM  Take 40 mg by mouth daily before breakfast.     fluticasone 50 MCG/ACT nasal spray  Commonly known as:  FLONASE  Place 2 sprays into both nostrils daily.     hydrALAZINE 25 MG tablet  Commonly known as:  APRESOLINE  Take 25 mg by mouth 3 (three) times daily.     hydrocortisone 25 MG suppository  Commonly known as:  ANUSOL-HC  Place 25 mg rectally 2 (two) times daily as needed.     isosorbide mononitrate 60 MG 24 hr tablet  Commonly known as:  IMDUR  Take 1 tablet (60 mg total) by mouth daily before breakfast.     ketorolac 0.4 % Soln  Commonly known as:  ACULAR  Place 1 drop into both eyes daily.     lisinopril 5 MG tablet  Commonly known as:  PRINIVIL,ZESTRIL  Take 5 mg by  mouth daily.     losartan-hydrochlorothiazide 100-25 MG per tablet  Commonly known as:  HYZAAR  Take 1 tablet by mouth daily before breakfast.     lubiprostone 24 MCG capsule  Commonly known as:  AMITIZA  Take 1 capsule (24 mcg total) by mouth 2 (two) times daily with a meal.     metoCLOPramide 5 MG tablet  Commonly known as:  REGLAN  Take 5 mg by mouth 4 (four) times daily -  before meals and at bedtime.     MYRBETRIQ 25 MG Tb24 tablet  Generic drug:  mirabegron ER  Take 25 mg by mouth daily.     nitroGLYCERIN 0.4 MG/SPRAY spray  Commonly known as:  NITROLINGUAL  Place 2 sprays under the tongue every 5 (five) minutes as needed. Use 2 sprays under tongue as needed for chest pain. Repeat 2 sprays if pain persists after 15 minutes if pain still persists     ondansetron 4 MG tablet  Commonly known as:  ZOFRAN  Take 1 tablet (4 mg total) by mouth every 6 (six) hours as needed for nausea.     PATADAY 0.2 % Soln  Generic drug:  Olopatadine HCl  Place 1 drop into both eyes 2 (two) times daily.     polyethylene glycol packet  Commonly known as:  MIRALAX / GLYCOLAX  Take 17 g by mouth daily. Mix in 8 oz of suitable liquid and drink by mouth every day     potassium chloride 10 MEQ tablet  Commonly known as:  K-DUR  Take 10 mEq by mouth 2 (two) times daily. Do not crush     rosuvastatin 5 MG tablet  Commonly known as:  CRESTOR  Take 5 mg by mouth at bedtime.     senna 8.6 MG Tabs tablet  Commonly known as:  SENOKOT  Take 1 tablet by mouth 2 (two) times daily.     spironolactone 25 MG tablet  Commonly known as:  ALDACTONE  Take 25 mg by mouth daily before breakfast.     traMADol 50 MG tablet  Commonly known as:  ULTRAM  Take 1 tablet (50 mg total) by mouth every 6 (six) hours as needed for moderate pain.         Signed: Cherylynn Ridges 06/20/2013, 8:49 AM

## 2013-06-20 NOTE — Progress Notes (Signed)
[facility] called about the transport.

## 2013-06-20 NOTE — Clinical Social Work Note (Signed)
Discharge packet complete with exception of FL2 signature, MD to sign today 06/20/2013. CSW faxed FL2 and discharge summary to [facility], currently reviewing information and will call RN and CSW once decision made. CSW contacted pt's sister, [name], regarding pt's discharge disposition. Eber Jones stated that she was agreeable to pt returning to ALF, and that pt would need to be transported via [facility] ([phone]). CSW to continue to follow and assist with discharge planning needs.  Darlyn Chamber, LCSWA Clinical Social Worker [phone]

## 2013-07-03 ENCOUNTER — Encounter (INDEPENDENT_AMBULATORY_CARE_PROVIDER_SITE_OTHER): Payer: Self-pay

## 2013-07-03 ENCOUNTER — Encounter (INDEPENDENT_AMBULATORY_CARE_PROVIDER_SITE_OTHER): Payer: Medicare Other | Admitting: General Surgery

## 2013-07-17 ENCOUNTER — Ambulatory Visit (INDEPENDENT_AMBULATORY_CARE_PROVIDER_SITE_OTHER): Payer: Medicare Other | Admitting: General Surgery

## 2013-07-17 ENCOUNTER — Encounter (INDEPENDENT_AMBULATORY_CARE_PROVIDER_SITE_OTHER): Payer: Self-pay | Admitting: General Surgery

## 2013-07-17 VITALS — BP 160/90 | HR 84 | Temp 98.2°F | Resp 14 | Ht 65.0 in | Wt 159.4 lb

## 2013-07-17 DIAGNOSIS — Z09 Encounter for follow-up examination after completed treatment for conditions other than malignant neoplasm: Secondary | ICD-10-CM

## 2013-07-17 NOTE — Progress Notes (Signed)
I am happy to report that the patient is doing well status post open right inguinal hernia repair. Her caregiver who is in the room mother states that she is doing well and mobilizing very well. The patient also states that she is doing well but is still sore in that area.  On examination she has some mild bruising in that area but no evidence of infection and definitely no evidence of recurrent hernia. She is doing well postoperatively and needs to return to see me on an as-needed basis.

## 2013-08-23 ENCOUNTER — Emergency Department (HOSPITAL_COMMUNITY): Payer: Medicare Other

## 2013-08-23 ENCOUNTER — Emergency Department (HOSPITAL_COMMUNITY)
Admission: EM | Admit: 2013-08-23 | Discharge: 2013-08-23 | Disposition: A | Discharge status: Home / Self Care | Payer: Medicare Other | Attending: Emergency Medicine | Admitting: Emergency Medicine

## 2013-08-23 ENCOUNTER — Encounter (HOSPITAL_COMMUNITY): Payer: Self-pay | Admitting: Emergency Medicine

## 2013-08-23 DIAGNOSIS — E669 Obesity, unspecified: Secondary | ICD-10-CM | POA: Insufficient documentation

## 2013-08-23 DIAGNOSIS — Z7982 Long term (current) use of aspirin: Secondary | ICD-10-CM | POA: Insufficient documentation

## 2013-08-23 DIAGNOSIS — K5909 Other constipation: Secondary | ICD-10-CM | POA: Insufficient documentation

## 2013-08-23 DIAGNOSIS — E119 Type 2 diabetes mellitus without complications: Secondary | ICD-10-CM | POA: Insufficient documentation

## 2013-08-23 DIAGNOSIS — F0390 Unspecified dementia without behavioral disturbance: Secondary | ICD-10-CM | POA: Insufficient documentation

## 2013-08-23 DIAGNOSIS — Z79899 Other long term (current) drug therapy: Secondary | ICD-10-CM | POA: Insufficient documentation

## 2013-08-23 DIAGNOSIS — Z862 Personal history of diseases of the blood and blood-forming organs and certain disorders involving the immune mechanism: Secondary | ICD-10-CM | POA: Insufficient documentation

## 2013-08-23 DIAGNOSIS — I509 Heart failure, unspecified: Secondary | ICD-10-CM | POA: Insufficient documentation

## 2013-08-23 DIAGNOSIS — R05 Cough: Secondary | ICD-10-CM | POA: Insufficient documentation

## 2013-08-23 DIAGNOSIS — Z9889 Other specified postprocedural states: Secondary | ICD-10-CM | POA: Insufficient documentation

## 2013-08-23 DIAGNOSIS — I10 Essential (primary) hypertension: Secondary | ICD-10-CM | POA: Insufficient documentation

## 2013-08-23 DIAGNOSIS — I252 Old myocardial infarction: Secondary | ICD-10-CM | POA: Insufficient documentation

## 2013-08-23 DIAGNOSIS — G8929 Other chronic pain: Secondary | ICD-10-CM | POA: Insufficient documentation

## 2013-08-23 DIAGNOSIS — I2510 Atherosclerotic heart disease of native coronary artery without angina pectoris: Secondary | ICD-10-CM | POA: Insufficient documentation

## 2013-08-23 DIAGNOSIS — IMO0002 Reserved for concepts with insufficient information to code with codable children: Secondary | ICD-10-CM | POA: Insufficient documentation

## 2013-08-23 DIAGNOSIS — Z8709 Personal history of other diseases of the respiratory system: Secondary | ICD-10-CM | POA: Insufficient documentation

## 2013-08-23 DIAGNOSIS — Z9861 Coronary angioplasty status: Secondary | ICD-10-CM | POA: Insufficient documentation

## 2013-08-23 DIAGNOSIS — M79609 Pain in unspecified limb: Secondary | ICD-10-CM | POA: Insufficient documentation

## 2013-08-23 DIAGNOSIS — M1990 Unspecified osteoarthritis, unspecified site: Secondary | ICD-10-CM | POA: Insufficient documentation

## 2013-08-23 DIAGNOSIS — Z87442 Personal history of urinary calculi: Secondary | ICD-10-CM | POA: Insufficient documentation

## 2013-08-23 DIAGNOSIS — G4733 Obstructive sleep apnea (adult) (pediatric): Secondary | ICD-10-CM | POA: Insufficient documentation

## 2013-08-23 DIAGNOSIS — R0602 Shortness of breath: Secondary | ICD-10-CM | POA: Insufficient documentation

## 2013-08-23 DIAGNOSIS — F411 Generalized anxiety disorder: Secondary | ICD-10-CM | POA: Insufficient documentation

## 2013-08-23 DIAGNOSIS — E785 Hyperlipidemia, unspecified: Secondary | ICD-10-CM | POA: Insufficient documentation

## 2013-08-23 DIAGNOSIS — H269 Unspecified cataract: Secondary | ICD-10-CM | POA: Insufficient documentation

## 2013-08-23 DIAGNOSIS — Z8669 Personal history of other diseases of the nervous system and sense organs: Secondary | ICD-10-CM | POA: Insufficient documentation

## 2013-08-23 DIAGNOSIS — K219 Gastro-esophageal reflux disease without esophagitis: Secondary | ICD-10-CM | POA: Insufficient documentation

## 2013-08-23 DIAGNOSIS — R059 Cough, unspecified: Secondary | ICD-10-CM | POA: Insufficient documentation

## 2013-08-23 LAB — URINALYSIS, ROUTINE W REFLEX MICROSCOPIC
BILIRUBIN URINE: NEGATIVE
Glucose, UA: NEGATIVE mg/dL
Hgb urine dipstick: NEGATIVE
KETONES UR: NEGATIVE mg/dL
NITRITE: NEGATIVE
Protein, ur: NEGATIVE mg/dL
Specific Gravity, Urine: 1.006 (ref 1.005–1.030)
UROBILINOGEN UA: 0.2 mg/dL (ref 0.0–1.0)
pH: 6 (ref 5.0–8.0)

## 2013-08-23 LAB — CBC WITH DIFFERENTIAL/PLATELET
BASOS PCT: 1 % (ref 0–1)
Basophils Absolute: 0 10*3/uL (ref 0.0–0.1)
Eosinophils Absolute: 0.1 10*3/uL (ref 0.0–0.7)
Eosinophils Relative: 3 % (ref 0–5)
HCT: 32.1 % — ABNORMAL LOW (ref 36.0–46.0)
HEMOGLOBIN: 10.5 g/dL — AB (ref 12.0–15.0)
Lymphocytes Relative: 31 % (ref 12–46)
Lymphs Abs: 0.8 10*3/uL (ref 0.7–4.0)
MCH: 28.4 pg (ref 26.0–34.0)
MCHC: 32.7 g/dL (ref 30.0–36.0)
MCV: 86.8 fL (ref 78.0–100.0)
Monocytes Absolute: 0.5 10*3/uL (ref 0.1–1.0)
Monocytes Relative: 20 % — ABNORMAL HIGH (ref 3–12)
NEUTROS ABS: 1.1 10*3/uL — AB (ref 1.7–7.7)
Neutrophils Relative %: 45 % (ref 43–77)
Platelets: 203 10*3/uL (ref 150–400)
RBC: 3.7 MIL/uL — ABNORMAL LOW (ref 3.87–5.11)
RDW: 14.5 % (ref 11.5–15.5)
WBC: 2.6 10*3/uL — ABNORMAL LOW (ref 4.0–10.5)

## 2013-08-23 LAB — COMPREHENSIVE METABOLIC PANEL
ALBUMIN: 3.6 g/dL (ref 3.5–5.2)
ALT: 10 U/L (ref 0–35)
AST: 16 U/L (ref 0–37)
Alkaline Phosphatase: 50 U/L (ref 39–117)
BUN: 13 mg/dL (ref 6–23)
CHLORIDE: 103 meq/L (ref 96–112)
CO2: 19 meq/L (ref 19–32)
Calcium: 9.3 mg/dL (ref 8.4–10.5)
Creatinine, Ser: 0.83 mg/dL (ref 0.50–1.10)
GFR calc Af Amer: 73 mL/min — ABNORMAL LOW (ref >90)
GFR, EST NON AFRICAN AMERICAN: 63 mL/min — AB (ref >90)
Glucose, Bld: 109 mg/dL — ABNORMAL HIGH (ref 70–99)
Potassium: 4.3 mEq/L (ref 3.7–5.3)
Sodium: 139 mEq/L (ref 137–147)
Total Bilirubin: 0.2 mg/dL — ABNORMAL LOW (ref 0.3–1.2)
Total Protein: 7.1 g/dL (ref 6.0–8.3)

## 2013-08-23 LAB — URINE MICROSCOPIC-ADD ON

## 2013-08-23 LAB — I-STAT CG4 LACTIC ACID, ED: Lactic Acid, Venous: 0.75 mmol/L (ref 0.5–2.2)

## 2013-08-23 LAB — LIPASE, BLOOD: LIPASE: 58 U/L (ref 11–59)

## 2013-08-23 LAB — TROPONIN I

## 2013-08-23 MED ORDER — TRAMADOL HCL 50 MG PO TABS: 50.0000 mg | ORAL_TABLET | Freq: Four times a day (QID) | ORAL | Status: DC | PRN

## 2013-08-23 MED ORDER — BENZONATATE 100 MG PO CAPS
100.0000 mg | ORAL_CAPSULE | Freq: Three times a day (TID) | ORAL | Status: DC | PRN
Start: 2013-08-23 — End: 2014-03-05

## 2013-08-23 MED ORDER — SODIUM CHLORIDE 0.9 % IV BOLUS (SEPSIS)
1000.0000 mL | Freq: Once | INTRAVENOUS | Status: AC
  Administered 2013-08-23: 1000 mL via INTRAVENOUS

## 2013-08-23 NOTE — ED Notes (Signed)
Per EMS pt from [facility], pt complaining of shortness of breath, pain w/ cough, pain w/ deep breath, bilateral leg pain and constipation x 1 month, pt is afib on monitor, hx of same.

## 2013-08-23 NOTE — Discharge Instructions (Signed)
As discussed, your evaluation today has been largely reassuring.  But, it is important that you monitor your condition carefully, and do not hesitate to return to the ED if you develop new, or concerning changes in your condition.  Otherwise, please follow-up with your physician for appropriate ongoing care.    Cough, Adult  A cough is a reflex. It helps you clear your throat and airways. A cough can help heal your body. A cough can last 2 or 3 weeks (acute) or may last more than 8 weeks (chronic). Some common causes of a cough can include an infection, allergy, or a cold. HOME CARE  Only take medicine as told by your doctor.  If given, take your medicines (antibiotics) as told. Finish them even if you start to feel better.  Use a cold steam vaporizer or humidier in your home. This can help loosen thick spit (secretions).  Sleep so you are almost sitting up (semi-upright). Use pillows to do this. This helps reduce coughing.  Rest as needed.  Stop smoking if you smoke. GET HELP RIGHT AWAY IF:  You have yellowish-white fluid (pus) in your thick spit.  Your cough gets worse.  Your medicine does not reduce coughing, and you are losing sleep.  You cough up blood.  You have trouble breathing.  Your pain gets worse and medicine does not help.  You have a fever. MAKE SURE YOU:   Understand these instructions.  Will watch your condition.  Will get help right away if you are not doing well or get worse. Document Released: 02/25/2011 Document Revised: 09/06/2011 Document Reviewed: 02/25/2011 [facility] Patient Information 2014 [place].

## 2013-08-23 NOTE — ED Notes (Signed)
Bed: WA12 Expected date:  Expected time:  Means of arrival:  Comments: EMS-SOB 

## 2013-08-23 NOTE — ED Provider Notes (Signed)
CSN: [id number]     Arrival date & time 08/23/13  1442 History   First MD Initiated Contact with Patient 08/23/13 1501     Chief Complaint  Patient presents with  . Shortness of Breath  . Cough  . Leg Pain     HPI  Patient presents from NH w multiple complaints: cough, CP (l / sore / non-radiating) w coughing. Onset is unclear.  No alleviating or exacerbating factors. No f/c, n/v/d/abd pain. No confusion / disorientation / syncope / new weakness  Past Medical History  Diagnosis Date  . Coronary artery disease   . Dementia   . Hypertension   . Hyperlipidemia   . Anxiety   . GERD (gastroesophageal reflux disease)   . CHF (congestive heart failure)   . Polyp, stomach 11/11/2006  . Aphakia of left eye   . Obese   . MI (myocardial infarction)   . Chest pain at rest 12/20/2011  . CAD (coronary artery disease), cutting balloon athrectomy of her dominant AV groove of LCX.  2007 12/20/2011  . Bradycardia 12/20/2011  . HTN (hypertension) 12/20/2011  . Dyslipidemia  12/20/2011  . Cardiomyopathy   . History of GI bleed   . Atrial fibrillation   . Sigmoid diverticulosis   . Anemia, iron deficiency 10/24/2012  . Constipation due to pain medication 10/24/2012  . Cataract     "coming back in both eyes" (06/19/2013)  . Chronic bronchitis     "get it q yr" (06/19/2013)  . Shortness of breath     "comes on at anytime" (06/19/2013)  . OSA on CPAP 12/20/2011  . Type II diabetes mellitus   . DJD (degenerative joint disease)   . Osteoarthritis   . Arthritis     "all over my body" (06/19/2013)  . Chronic back pain     "all over my back" (06/19/2013)  . Nephrolithiasis     bilateral   Past Surgical History  Procedure Laterality Date  . Back surgery    . Appendectomy    . Fracture surgery    . Dilation and curettage of uterus    . Coronary angioplasty    . Esophagogastroduodenoscopy    . Colonoscopy    . Eus    . Esophagogastroduodenoscopy  02/21/2012    Procedure:  ESOPHAGOGASTRODUODENOSCOPY (EGD);  Surgeon: Gatha Mayer, MD;  Location: [facility];  Service: Endoscopy;  Laterality: N/A;  pt. being evaluated for a pacemaker  . Balloon dilation  02/21/2012    Procedure: BALLOON DILATION;  Surgeon: Gatha Mayer, MD;  Location: [facility];  Service: Endoscopy;  Laterality: N/A;  . Colonoscopy N/A 10/24/2012    Procedure: COLONOSCOPY;  Surgeon: Gatha Mayer, MD;  Location: [facility];  Service: Endoscopy;  Laterality: N/A;  . Coronary angioplasty  03-07-2004    cutting balloon  . Cardiac catheterization  06/05/2004    high grade disease AV groove CX  . Cardiac catheterization  04/19/2005    noncritical CAD  . US echocardiography  01/25/2008    mild DUST,mild MR,mod. ca+ mitral annular,AOV mildly sclerotid  . Lexiscan mycardial perfusion scan  01/25/2008    Normal  . Inguinal hernia repair Right 06/18/2013  . Tonsillectomy    . Total abdominal hysterectomy    . Cataract extraction w/ intraocular lens  implant, bilateral Bilateral   . Breast biopsy Right   . Breast lumpectomy Right   . Inguinal hernia repair Right 06/18/2013    Procedure: HERNIA REPAIR INGUINAL ADULT;  Surgeon: Jeneen Rinks  Michael Boston, MD;  Location: [place];  Service: General;  Laterality: Right;  . Insertion of mesh Right 06/18/2013    Procedure: INSERTION OF MESH;  Surgeon: Gwenyth Ober, MD;  Location: [place];  Service: General;  Laterality: Right;   Family History  Problem Relation Age of Onset  . Colon cancer Mother   . Heart disease Mother   . Stroke Mother    History  Substance Use Topics  . Smoking status: Never Smoker   . Smokeless tobacco: Never Used  . Alcohol Use: No   OB History   Grav Para Term Preterm Abortions TAB SAB Ect Mult Living                 Review of Systems  Constitutional:       Per HPI, otherwise negative  HENT:       Per HPI, otherwise negative  Respiratory:       Per HPI, otherwise negative  Cardiovascular:       Per HPI, otherwise negative    Gastrointestinal: Negative for vomiting.  Endocrine:       Negative aside from HPI  Genitourinary:       Neg aside from HPI   Musculoskeletal:       Per HPI, otherwise negative  Skin: Negative.   Neurological: Negative for syncope.      Allergies  Chicken allergy; Fish allergy; and Percocet  Home Medications   Current Outpatient Rx  Name  Route  Sig  Dispense  Refill  . amLODipine (NORVASC) 10 MG tablet   Oral   Take 10 mg by mouth daily.         Marland Kitchen aspirin 81 MG chewable tablet   Oral   Chew 81 mg by mouth daily.         . calcium-vitamin D (OSCAL WITH D) 500-200 MG-UNIT per tablet   Oral   Take 1 tablet by mouth daily.         . cetirizine (ZYRTEC) 5 MG tablet   Oral   Take 5 mg by mouth daily.         . cholecalciferol (VITAMIN D) 400 UNITS TABS tablet   Oral   Take 800 Units by mouth daily.         . citalopram (CELEXA) 20 MG tablet   Oral   Take 20 mg by mouth daily before breakfast.          . docusate sodium (COLACE) 100 MG capsule   Oral   Take 100 mg by mouth 2 (two) times daily.         Marland Kitchen esomeprazole (NEXIUM) 40 MG capsule   Oral   Take 40 mg by mouth daily before breakfast.         . fluticasone (FLONASE) 50 MCG/ACT nasal spray   Each Nare   Place 2 sprays into both nostrils daily.          Marland Kitchen guaiFENesin (MUCINEX) 600 MG 12 hr tablet   Oral   Take 1,200 mg by mouth 2 (two) times daily. For 10 days         . hydrALAZINE (APRESOLINE) 25 MG tablet   Oral   Take 25 mg by mouth 3 (three) times daily.         . isosorbide mononitrate (IMDUR) 60 MG 24 hr tablet   Oral   Take 1 tablet (60 mg total) by mouth daily before breakfast.   30 tablet   6   .  ketorolac (ACULAR) 0.4 % SOLN   Both Eyes   Place 1 drop into both eyes daily.          Marland Kitchen lisinopril (PRINIVIL,ZESTRIL) 5 MG tablet   Oral   Take 5 mg by mouth daily.         Marland Kitchen losartan-hydrochlorothiazide (HYZAAR) 100-25 MG per tablet   Oral   Take 1 tablet by  mouth daily before breakfast.          . lubiprostone (AMITIZA) 24 MCG capsule   Oral   Take 1 capsule (24 mcg total) by mouth 2 (two) times daily with a meal.   60 capsule   11   . metoCLOPramide (REGLAN) 5 MG tablet   Oral   Take 5 mg by mouth 4 (four) times daily -  before meals and at bedtime.          . mirabegron ER (MYRBETRIQ) 50 MG TB24 tablet   Oral   Take 50 mg by mouth daily.         . nitroGLYCERIN (NITROLINGUAL) 0.4 MG/SPRAY spray   Sublingual   Place 2 sprays under the tongue every 5 (five) minutes as needed. Use 2 sprays under tongue as needed for chest pain. Repeat 2 sprays if pain persists after 15 minutes if pain still persists         . Olopatadine HCl (PATADAY) 0.2 % SOLN   Both Eyes   Place 1 drop into both eyes 2 (two) times daily.          . ondansetron (ZOFRAN) 4 MG tablet   Oral   Take 1 tablet (4 mg total) by mouth every 6 (six) hours as needed for nausea.   20 tablet   0   . polyethylene glycol (MIRALAX / GLYCOLAX) packet   Oral   Take 17 g by mouth daily. Mix in 8 oz of suitable liquid and drink by mouth every day         . potassium chloride (K-DUR) 10 MEQ tablet   Oral   Take 10 mEq by mouth 2 (two) times daily. Do not crush         . rosuvastatin (CRESTOR) 5 MG tablet   Oral   Take 5 mg by mouth at bedtime.          . senna (SENOKOT) 8.6 MG TABS   Oral   Take 1 tablet by mouth 2 (two) times daily.         . shark liver oil-cocoa butter (PREPARATION H) 0.25-3-85.5 % suppository   Rectal   Place 1 suppository rectally 2 (two) times daily as needed for hemorrhoids.         Marland Kitchen spironolactone (ALDACTONE) 25 MG tablet   Oral   Take 25 mg by mouth daily before breakfast.          . traMADol (ULTRAM) 50 MG tablet   Oral   Take 1 tablet (50 mg total) by mouth every 6 (six) hours as needed for moderate pain.   30 tablet   1    BP 157/58  Pulse 66  Temp(Src) 98.9 F (37.2 C) (Oral)  Resp 25  SpO2 95% Physical  Exam  Nursing note and vitals reviewed. Constitutional: She is oriented to person, place, and time. She appears ill. No distress.  HENT:  Head: Normocephalic and atraumatic.  Mouth/Throat:    Eyes: Conjunctivae and EOM are normal.  Cardiovascular: Normal rate and regular rhythm.   Pulmonary/Chest: Effort normal  and breath sounds normal. No stridor. No respiratory distress. She has no wheezes. She has no rales. She exhibits tenderness.  Abdominal: She exhibits no distension.  Musculoskeletal: She exhibits no edema.  No appreciable LE edema  Neurological: She is alert and oriented to person, place, and time. No cranial nerve deficit.  Skin: Skin is warm and dry.  Psychiatric: She has a normal mood and affect.    ED Course  Procedures (including critical care time) Labs Review Labs Reviewed  CBC WITH DIFFERENTIAL - Abnormal; Notable for the following:    WBC 2.6 (*)    RBC 3.70 (*)    Hemoglobin 10.5 (*)    HCT 32.1 (*)    Neutro Abs 1.1 (*)    Monocytes Relative 20 (*)    All other components within normal limits  COMPREHENSIVE METABOLIC PANEL - Abnormal; Notable for the following:    Glucose, Bld 109 (*)    Total Bilirubin 0.2 (*)    GFR calc non Af Amer 63 (*)    GFR calc Af Amer 73 (*)    All other components within normal limits  URINALYSIS, ROUTINE W REFLEX MICROSCOPIC - Abnormal; Notable for the following:    Leukocytes, UA TRACE (*)    All other components within normal limits  LIPASE, BLOOD  TROPONIN I  URINE MICROSCOPIC-ADD ON  I-STAT CG4 LACTIC ACID, ED   Imaging Review Dg Chest 2 View  08/23/2013   CLINICAL DATA:  Atrial fibrillation. Coronary artery disease. Hypertension.  EXAM: CHEST  2 VIEW  COMPARISON:  DG CHEST 2 VIEW dated 06/20/2013  FINDINGS: Slight prominence of the upper mediastinum noted, this is most likely segment prominent great vessels, no tracheal deviation. Hilar structures normal. Mild subsegmental atelectasis right lung base. No pleural  effusion. No pneumothorax. No acute bony abnormality. Degenerative changes thoracic spine. Degenerative changes both shoulders.  IMPRESSION: Mild subsegmental atelectasis right lung base otherwise negative exam.   Electronically Signed   By: Marcello Moores  Register   On: 08/23/2013 16:19    EKG Interpretation    Date/Time:  Thursday August 23 2013 14:56:43 EST Ventricular Rate:  63 PR Interval:  187 QRS Duration: 104 QT Interval:  425 QTC Calculation: 435 R Axis:   -24 Text Interpretation:  Sinus rhythm Borderline left axis deviation Minimal ST elevation, inferior leads Sinus rhythm Artifact No significant change since last tracing Left axis deviation Abnormal ekg Confirmed by Carmin Muskrat  MD ([id number]) on 08/23/2013 3:52:04 PM            O2 - 97%ra, nml  MDM   Final diagnoses:  Cough    This elderly female with multiple medical problems presents from nursing facility with multiple complaints.  On exam patient is awake, alert, with no hypoxia, evidence of distress.  Patient's evaluation is largely reassuring, with unremarkable labs, x-ray that does not demonstrate pneumonia.  Given the duration of symptoms of the patient's reassuring x-ray, EKG, labs, vitals all are suggestive of likely viral illness, with low suspicion for ongoing coronary ischemia, or other acute systemic pathology.  Patient is returning to a monitored facility.    Carmin Muskrat, MD 08/23/13 [phone]

## 2013-09-06 ENCOUNTER — Ambulatory Visit (INDEPENDENT_AMBULATORY_CARE_PROVIDER_SITE_OTHER): Payer: Medicare Other | Admitting: Cardiology

## 2013-09-06 ENCOUNTER — Encounter: Payer: Self-pay | Admitting: Cardiology

## 2013-09-06 VITALS — BP 130/60 | HR 74 | Ht 67.0 in | Wt 157.0 lb

## 2013-09-06 DIAGNOSIS — R0602 Shortness of breath: Secondary | ICD-10-CM | POA: Insufficient documentation

## 2013-09-06 DIAGNOSIS — I10 Essential (primary) hypertension: Secondary | ICD-10-CM

## 2013-09-06 DIAGNOSIS — I2510 Atherosclerotic heart disease of native coronary artery without angina pectoris: Secondary | ICD-10-CM

## 2013-09-06 DIAGNOSIS — E876 Hypokalemia: Secondary | ICD-10-CM

## 2013-09-06 DIAGNOSIS — E119 Type 2 diabetes mellitus without complications: Secondary | ICD-10-CM

## 2013-09-06 LAB — BASIC METABOLIC PANEL
BUN: 16 mg/dL (ref 6–23)
CHLORIDE: 105 meq/L (ref 96–112)
CO2: 23 mEq/L (ref 19–32)
Calcium: 9.1 mg/dL (ref 8.4–10.5)
Creat: 0.88 mg/dL (ref 0.50–1.10)
GLUCOSE: 91 mg/dL (ref 70–99)
POTASSIUM: 4 meq/L (ref 3.5–5.3)
Sodium: 137 mEq/L (ref 135–145)

## 2013-09-06 MED ORDER — FUROSEMIDE 40 MG PO TABS: 40.0000 mg | ORAL_TABLET | Freq: Every day | ORAL | Status: DC

## 2013-09-06 NOTE — Patient Instructions (Addendum)
Take Lasix 40 mg every other day for 6 days then stop. For your shortness of breath  Take K dur 20 meq every other day for 6 days with Lasix then stop.  Follow up with Dr. Gwenlyn Found in 6 months, call if any problems before that time.  Stop Lisinopril  Watch the salt in your diet.  Have blood work done today.

## 2013-09-06 NOTE — Progress Notes (Signed)
09/10/2013   PCP: Reymundo Poll, MD   Chief Complaint  Patient presents with  . Follow-up    Sob and CP  with this 6 month visit    Primary Cardiologist: Dr. Gwenlyn Found  HPI:  80 year old moderately overweight widowed African American female with no children with a history of hypertension and bradycardia. She also has known CAD, status post cutting balloon atherectomy of an AV groove circumflex in 2007 by Dr. Gwenlyn Found with normal LV function. She does have some dementia as well as GERD but otherwise is asymptomatic.  Several months ago she was seen complaining of chest pain which was nitrate responsive. A subsequent Myoview stress test was normal. On further questioning her pains seems to occur after eating vegetables suggesting a gastrointestinal etiology vascular profile performed 09/27/12 revealed the left of 86, LDL 29 and HDL of 39.  She just underwent rt inguinal hernia repair which she tolerated well.  Today only complained of SOB and some swelling.  No chest pain.  No severe shortness of breath.       Allergies  Allergen Reactions  . Chicken Allergy     unknown  . Fish Allergy     unknown  . Percocet [Oxycodone-Acetaminophen] Itching    Current Outpatient Prescriptions  Medication Sig Dispense Refill  . amLODipine (NORVASC) 10 MG tablet Take 10 mg by mouth daily.      Marland Kitchen aspirin 81 MG chewable tablet Chew 81 mg by mouth daily.      . benzonatate (TESSALON) 100 MG capsule Take 1 capsule (100 mg total) by mouth 3 (three) times daily as needed for cough.  21 capsule  0  . calcium-vitamin D (OSCAL WITH D) 500-200 MG-UNIT per tablet Take 1 tablet by mouth daily.      . cetirizine (ZYRTEC) 5 MG tablet Take 5 mg by mouth daily.      . cholecalciferol (VITAMIN D) 400 UNITS TABS tablet Take 800 Units by mouth daily.      . citalopram (CELEXA) 20 MG tablet Take 20 mg by mouth daily before breakfast.       . docusate sodium (COLACE) 100 MG capsule Take 100 mg by mouth 2 (two)  times daily.      Marland Kitchen esomeprazole (NEXIUM) 40 MG capsule Take 40 mg by mouth daily before breakfast.      . fluticasone (FLONASE) 50 MCG/ACT nasal spray Place 2 sprays into both nostrils daily.       Marland Kitchen guaiFENesin (MUCINEX) 600 MG 12 hr tablet Take 1,200 mg by mouth 2 (two) times daily. For 10 days      . hydrALAZINE (APRESOLINE) 25 MG tablet Take 25 mg by mouth 3 (three) times daily.      . isosorbide mononitrate (IMDUR) 60 MG 24 hr tablet Take 1 tablet (60 mg total) by mouth daily before breakfast.  30 tablet  6  . ketorolac (ACULAR) 0.4 % SOLN Place 1 drop into both eyes daily.       Marland Kitchen losartan-hydrochlorothiazide (HYZAAR) 100-25 MG per tablet Take 1 tablet by mouth daily before breakfast.       . Olopatadine HCl (PATADAY) 0.2 % SOLN Place 1 drop into both eyes 2 (two) times daily.       . ondansetron (ZOFRAN) 4 MG tablet Take 1 tablet (4 mg total) by mouth every 6 (six) hours as needed for nausea.  20 tablet  0  . polyethylene glycol (MIRALAX / GLYCOLAX) packet Take  17 g by mouth daily. Mix in 8 oz of suitable liquid and drink by mouth every day      . potassium chloride (K-DUR) 10 MEQ tablet Take 10 mEq by mouth 2 (two) times daily. Do not crush      . rosuvastatin (CRESTOR) 5 MG tablet Take 5 mg by mouth at bedtime.       . senna (SENOKOT) 8.6 MG TABS Take 1 tablet by mouth 2 (two) times daily.      . shark liver oil-cocoa butter (PREPARATION H) 0.25-3-85.5 % suppository Place 1 suppository rectally 2 (two) times daily as needed for hemorrhoids.      Marland Kitchen spironolactone (ALDACTONE) 25 MG tablet Take 25 mg by mouth daily before breakfast.       . traMADol (ULTRAM) 50 MG tablet Take 1 tablet (50 mg total) by mouth every 6 (six) hours as needed for moderate pain.  15 tablet  0  . furosemide (LASIX) 40 MG tablet Take 1 tablet (40 mg total) by mouth daily.  10 tablet  0  . lubiprostone (AMITIZA) 24 MCG capsule Take 1 capsule (24 mcg total) by mouth 2 (two) times daily with a meal.  60 capsule  11  .  metoCLOPramide (REGLAN) 5 MG tablet Take 5 mg by mouth 4 (four) times daily -  before meals and at bedtime.       . mirabegron ER (MYRBETRIQ) 50 MG TB24 tablet Take 50 mg by mouth daily.      . nitroGLYCERIN (NITROLINGUAL) 0.4 MG/SPRAY spray Place 2 sprays under the tongue every 5 (five) minutes as needed. Use 2 sprays under tongue as needed for chest pain. Repeat 2 sprays if pain persists after 15 minutes if pain still persists       No current facility-administered medications for this visit.    Past Medical History  Diagnosis Date  . Coronary artery disease   . Dementia   . Hypertension   . Hyperlipidemia   . Anxiety   . GERD (gastroesophageal reflux disease)   . CHF (congestive heart failure)   . Polyp, stomach 11/11/2006  . Aphakia of left eye   . Obese   . MI (myocardial infarction)   . Chest pain at rest 12/20/2011  . CAD (coronary artery disease), cutting balloon athrectomy of her dominant AV groove of LCX.  2007 12/20/2011  . Bradycardia 12/20/2011  . HTN (hypertension) 12/20/2011  . Dyslipidemia  12/20/2011  . Cardiomyopathy   . History of GI bleed   . Atrial fibrillation   . Sigmoid diverticulosis   . Anemia, iron deficiency 10/24/2012  . Constipation due to pain medication 10/24/2012  . Cataract     "coming back in both eyes" (06/19/2013)  . Chronic bronchitis     "get it q yr" (06/19/2013)  . Shortness of breath     "comes on at anytime" (06/19/2013)  . OSA on CPAP 12/20/2011  . Type II diabetes mellitus   . DJD (degenerative joint disease)   . Osteoarthritis   . Arthritis     "all over my body" (06/19/2013)  . Chronic back pain     "all over my back" (06/19/2013)  . Nephrolithiasis     bilateral    Past Surgical History  Procedure Laterality Date  . Back surgery    . Appendectomy    . Fracture surgery    . Dilation and curettage of uterus    . Coronary angioplasty    . Esophagogastroduodenoscopy    .  Colonoscopy    . Eus    . Esophagogastroduodenoscopy   02/21/2012    Procedure: ESOPHAGOGASTRODUODENOSCOPY (EGD);  Surgeon: Gatha Mayer, MD;  Location: [facility];  Service: Endoscopy;  Laterality: N/A;  pt. being evaluated for a pacemaker  . Balloon dilation  02/21/2012    Procedure: BALLOON DILATION;  Surgeon: Gatha Mayer, MD;  Location: [facility];  Service: Endoscopy;  Laterality: N/A;  . Colonoscopy N/A 10/24/2012    Procedure: COLONOSCOPY;  Surgeon: Gatha Mayer, MD;  Location: [facility];  Service: Endoscopy;  Laterality: N/A;  . Coronary angioplasty  03-07-2004    cutting balloon  . Cardiac catheterization  06/05/2004    high grade disease AV groove CX  . Cardiac catheterization  04/19/2005    noncritical CAD  . US echocardiography  01/25/2008    mild DUST,mild MR,mod. ca+ mitral annular,AOV mildly sclerotid  . Lexiscan mycardial perfusion scan  01/25/2008    Normal  . Inguinal hernia repair Right 06/18/2013  . Tonsillectomy    . Total abdominal hysterectomy    . Cataract extraction w/ intraocular lens  implant, bilateral Bilateral   . Breast biopsy Right   . Breast lumpectomy Right   . Inguinal hernia repair Right 06/18/2013    Procedure: HERNIA REPAIR INGUINAL ADULT;  Surgeon: Gwenyth Ober, MD;  Location: [place];  Service: General;  Laterality: Right;  . Insertion of mesh Right 06/18/2013    Procedure: INSERTION OF MESH;  Surgeon: Gwenyth Ober, MD;  Location: [place];  Service: General;  Laterality: Right;    [id number] colds or fevers, no weight changes Skin:no rashes or ulcers HEENT:no blurred vision, no congestion CV:see HPI PUL:see HPI GI:no diarrhea constipation or melena, no indigestion GU:no hematuria, no dysuria MS:no joint pain, no claudication Neuro:no syncope, no lightheadedness Endo:+ diabetes, no thyroid disease  PHYSICAL EXAM BP 130/60  Pulse 74  Ht 5\' 7"  (1.702 m)  Wt 157 lb (71.215 kg)  BMI 24.58 kg/m2 General:Pleasant affect, NAD Skin:Warm and dry, brisk capillary  refill HEENT:normocephalic, sclera clear, mucus membranes moist Neck:supple, no JVD, no bruits  Heart:S1S2 RRR without murmur, gallup, rub or click Lungs:clear without rales, rhonchi, or wheezes [id number], non tender, + BS, do not palpate liver spleen or masses Ext:1+ lower ext edema, 2+ pedal pulses, 2+ radial pulses Neuro:alert and oriented, MAE, follows commands, + facial symmetry  EKG:SR LAD no acute changes  ASSESSMENT AND PLAN SOB (shortness of breath) Mild sob but no rales.  Placed on Lasix 40 mg every other day with K+ for 6 days only.  Check BMP, stop lisinopril.  She is on ACE and ARB and with her CKD would prefer either or.  Follow up with Dr. Gwenlyn Found in 6 months unless problems before hand.  CAD (coronary artery disease), cutting balloon athrectomy of her dominant AV groove of LCX.  2007 Stable no chest pain.  DM (diabetes mellitus) Followed by PCP.  HTN (hypertension) stable

## 2013-09-10 NOTE — Assessment & Plan Note (Signed)
Followed by PCP

## 2013-09-10 NOTE — Assessment & Plan Note (Addendum)
Mild sob but no rales.  Placed on Lasix 40 mg every other day with K+ for 6 days only.  Check BMP, stop lisinopril.  She is on ACE and ARB and with her CKD would prefer either or.  Follow up with Dr. Gwenlyn Found in 6 months unless problems before hand.

## 2013-09-10 NOTE — Assessment & Plan Note (Signed)
Stable no chest pain.

## 2013-09-10 NOTE — Progress Notes (Signed)
Pt.s  Nurse informed of lab result.

## 2013-09-10 NOTE — Assessment & Plan Note (Signed)
stable °

## 2013-10-03 ENCOUNTER — Telehealth: Payer: Self-pay | Admitting: Cardiovascular Disease

## 2013-10-03 NOTE — Telephone Encounter (Signed)
Pease call [facility] patient.

## 2013-10-03 NOTE — Telephone Encounter (Signed)
Patient seen by PA at [facility], [place].  Azucena Kuba is calling to verify if patient should still be taking potassium 33meq bid or were they suppose to D/C.  They have D/C'd the Furosemide and Lisinopril.  BMP is not going to be done until next week.  Will review with Cecilie Kicks, NP and advise. Per Mickel Baas need BMP to determine if she needs to be on potassium daily.  Called Tesha at [facility] and requested BMP be done this week - she will try but doubts she can get anyone over to draw the blood work.  Will advise on potassium after receiving BMP results.  Voiced understanding.

## 2013-10-28 ENCOUNTER — Encounter (HOSPITAL_COMMUNITY): Payer: Self-pay | Admitting: Emergency Medicine

## 2013-10-28 ENCOUNTER — Emergency Department (HOSPITAL_COMMUNITY)
Admission: EM | Admit: 2013-10-28 | Discharge: 2013-10-28 | Disposition: A | Discharge status: Home / Self Care | Payer: Medicare Other | Attending: Emergency Medicine | Admitting: Emergency Medicine

## 2013-10-28 ENCOUNTER — Emergency Department (HOSPITAL_COMMUNITY): Payer: Medicare Other

## 2013-10-28 DIAGNOSIS — Z862 Personal history of diseases of the blood and blood-forming organs and certain disorders involving the immune mechanism: Secondary | ICD-10-CM | POA: Insufficient documentation

## 2013-10-28 DIAGNOSIS — Z7982 Long term (current) use of aspirin: Secondary | ICD-10-CM | POA: Insufficient documentation

## 2013-10-28 DIAGNOSIS — Y9210 Unspecified residential institution as the place of occurrence of the external cause: Secondary | ICD-10-CM | POA: Insufficient documentation

## 2013-10-28 DIAGNOSIS — M1990 Unspecified osteoarthritis, unspecified site: Secondary | ICD-10-CM | POA: Insufficient documentation

## 2013-10-28 DIAGNOSIS — M1610 Unilateral primary osteoarthritis, unspecified hip: Secondary | ICD-10-CM | POA: Insufficient documentation

## 2013-10-28 DIAGNOSIS — G8929 Other chronic pain: Secondary | ICD-10-CM | POA: Insufficient documentation

## 2013-10-28 DIAGNOSIS — S99929A Unspecified injury of unspecified foot, initial encounter: Secondary | ICD-10-CM

## 2013-10-28 DIAGNOSIS — I4891 Unspecified atrial fibrillation: Secondary | ICD-10-CM | POA: Insufficient documentation

## 2013-10-28 DIAGNOSIS — K219 Gastro-esophageal reflux disease without esophagitis: Secondary | ICD-10-CM | POA: Insufficient documentation

## 2013-10-28 DIAGNOSIS — M25551 Pain in right hip: Secondary | ICD-10-CM

## 2013-10-28 DIAGNOSIS — Z87442 Personal history of urinary calculi: Secondary | ICD-10-CM | POA: Insufficient documentation

## 2013-10-28 DIAGNOSIS — M129 Arthropathy, unspecified: Secondary | ICD-10-CM | POA: Insufficient documentation

## 2013-10-28 DIAGNOSIS — S79929A Unspecified injury of unspecified thigh, initial encounter: Principal | ICD-10-CM

## 2013-10-28 DIAGNOSIS — F411 Generalized anxiety disorder: Secondary | ICD-10-CM | POA: Insufficient documentation

## 2013-10-28 DIAGNOSIS — R296 Repeated falls: Secondary | ICD-10-CM | POA: Insufficient documentation

## 2013-10-28 DIAGNOSIS — Z9861 Coronary angioplasty status: Secondary | ICD-10-CM | POA: Insufficient documentation

## 2013-10-28 DIAGNOSIS — E119 Type 2 diabetes mellitus without complications: Secondary | ICD-10-CM | POA: Insufficient documentation

## 2013-10-28 DIAGNOSIS — I10 Essential (primary) hypertension: Secondary | ICD-10-CM | POA: Insufficient documentation

## 2013-10-28 DIAGNOSIS — S79919A Unspecified injury of unspecified hip, initial encounter: Secondary | ICD-10-CM | POA: Insufficient documentation

## 2013-10-28 DIAGNOSIS — Y939 Activity, unspecified: Secondary | ICD-10-CM | POA: Insufficient documentation

## 2013-10-28 DIAGNOSIS — I509 Heart failure, unspecified: Secondary | ICD-10-CM | POA: Insufficient documentation

## 2013-10-28 DIAGNOSIS — I252 Old myocardial infarction: Secondary | ICD-10-CM | POA: Insufficient documentation

## 2013-10-28 DIAGNOSIS — Z79899 Other long term (current) drug therapy: Secondary | ICD-10-CM | POA: Insufficient documentation

## 2013-10-28 DIAGNOSIS — IMO0002 Reserved for concepts with insufficient information to code with codable children: Secondary | ICD-10-CM | POA: Insufficient documentation

## 2013-10-28 DIAGNOSIS — S99919A Unspecified injury of unspecified ankle, initial encounter: Secondary | ICD-10-CM

## 2013-10-28 DIAGNOSIS — E785 Hyperlipidemia, unspecified: Secondary | ICD-10-CM | POA: Insufficient documentation

## 2013-10-28 DIAGNOSIS — Z9889 Other specified postprocedural states: Secondary | ICD-10-CM | POA: Insufficient documentation

## 2013-10-28 DIAGNOSIS — I2510 Atherosclerotic heart disease of native coronary artery without angina pectoris: Secondary | ICD-10-CM | POA: Insufficient documentation

## 2013-10-28 DIAGNOSIS — G4733 Obstructive sleep apnea (adult) (pediatric): Secondary | ICD-10-CM | POA: Insufficient documentation

## 2013-10-28 DIAGNOSIS — S8990XA Unspecified injury of unspecified lower leg, initial encounter: Secondary | ICD-10-CM | POA: Insufficient documentation

## 2013-10-28 DIAGNOSIS — F0390 Unspecified dementia without behavioral disturbance: Secondary | ICD-10-CM | POA: Insufficient documentation

## 2013-10-28 DIAGNOSIS — E669 Obesity, unspecified: Secondary | ICD-10-CM | POA: Insufficient documentation

## 2013-10-28 LAB — CBC WITH DIFFERENTIAL/PLATELET
Basophils Absolute: 0 10*3/uL (ref 0.0–0.1)
Basophils Relative: 1 % (ref 0–1)
EOS ABS: 0.1 10*3/uL (ref 0.0–0.7)
Eosinophils Relative: 3 % (ref 0–5)
HEMATOCRIT: 31 % — AB (ref 36.0–46.0)
Hemoglobin: 10.1 g/dL — ABNORMAL LOW (ref 12.0–15.0)
LYMPHS ABS: 1.6 10*3/uL (ref 0.7–4.0)
Lymphocytes Relative: 40 % (ref 12–46)
MCH: 28.5 pg (ref 26.0–34.0)
MCHC: 32.6 g/dL (ref 30.0–36.0)
MCV: 87.6 fL (ref 78.0–100.0)
MONO ABS: 0.7 10*3/uL (ref 0.1–1.0)
MONOS PCT: 17 % — AB (ref 3–12)
Neutro Abs: 1.6 10*3/uL — ABNORMAL LOW (ref 1.7–7.7)
Neutrophils Relative %: 40 % — ABNORMAL LOW (ref 43–77)
Platelets: 189 10*3/uL (ref 150–400)
RBC: 3.54 MIL/uL — AB (ref 3.87–5.11)
RDW: 14.1 % (ref 11.5–15.5)
WBC: 3.9 10*3/uL — ABNORMAL LOW (ref 4.0–10.5)

## 2013-10-28 LAB — URINALYSIS, ROUTINE W REFLEX MICROSCOPIC
BILIRUBIN URINE: NEGATIVE
Glucose, UA: NEGATIVE mg/dL
Hgb urine dipstick: NEGATIVE
Ketones, ur: NEGATIVE mg/dL
Leukocytes, UA: NEGATIVE
Nitrite: NEGATIVE
PROTEIN: NEGATIVE mg/dL
Specific Gravity, Urine: 1.008 (ref 1.005–1.030)
UROBILINOGEN UA: 0.2 mg/dL (ref 0.0–1.0)
pH: 6 (ref 5.0–8.0)

## 2013-10-28 LAB — I-STAT TROPONIN, ED: Troponin i, poc: 0 ng/mL (ref 0.00–0.08)

## 2013-10-28 LAB — BASIC METABOLIC PANEL
BUN: 13 mg/dL (ref 6–23)
CO2: 22 mEq/L (ref 19–32)
CREATININE: 0.91 mg/dL (ref 0.50–1.10)
Calcium: 9.1 mg/dL (ref 8.4–10.5)
Chloride: 104 mEq/L (ref 96–112)
GFR calc Af Amer: 65 mL/min — ABNORMAL LOW (ref >90)
GFR, EST NON AFRICAN AMERICAN: 56 mL/min — AB (ref >90)
Glucose, Bld: 115 mg/dL — ABNORMAL HIGH (ref 70–99)
Potassium: 3.9 mEq/L (ref 3.7–5.3)
Sodium: 138 mEq/L (ref 137–147)

## 2013-10-28 MED ORDER — TRAMADOL HCL 50 MG PO TABS
50.0000 mg | ORAL_TABLET | Freq: Once | ORAL | Status: AC
  Administered 2013-10-28: 50 mg via ORAL
  Filled 2013-10-28: qty 1

## 2013-10-28 NOTE — ED Notes (Signed)
Per EMS, pt fell 1 week ago and declined to be transported to the hospital. Pt states the pain from the fall has gotten progressively worse and would like to be evaluated. Pt has 10/10 pain in her right hip radiating down her leg. Pt alert and oriented.

## 2013-10-28 NOTE — ED Notes (Signed)
Bed: WA01 Expected date:  Expected time:  Means of arrival:  Comments: EMS 

## 2013-10-28 NOTE — ED Notes (Signed)
[facility] called  

## 2013-10-28 NOTE — Discharge Instructions (Signed)
Continue taking tramadol 50 mg every 6 hrs for pain.   Follow up with your doctor.   Return to ER if you have severe pain, unable to walk, falls.

## 2013-10-28 NOTE — ED Provider Notes (Signed)
CSN: [id number]     Arrival date & time 10/28/13  1655 History   First MD Initiated Contact with Patient 10/28/13 1707     Chief Complaint  Patient presents with  . Hip Pain     (Consider location/radiation/quality/duration/timing/severity/associated sxs/prior Treatment) The history is provided by the patient.  Rosalina Dingwall is a 80 y.o. female hx of CAD, dementia, HL, CHF here with fall. She resides in a nursing home and fell a week ago. She states that she passed out a week ago. She has been walking on the right hip but now has more pain when she walks. Has some right hip and knee pain as well. Denies hitting her head a week ago. Denies neck pain. Denies any chest pain or abdominal pain.   Level V caveat- dementia    Past Medical History  Diagnosis Date  . Coronary artery disease   . Dementia   . Hypertension   . Hyperlipidemia   . Anxiety   . GERD (gastroesophageal reflux disease)   . CHF (congestive heart failure)   . Polyp, stomach 11/11/2006  . Aphakia of left eye   . Obese   . MI (myocardial infarction)   . Chest pain at rest 12/20/2011  . CAD (coronary artery disease), cutting balloon athrectomy of her dominant AV groove of LCX.  2007 12/20/2011  . Bradycardia 12/20/2011  . HTN (hypertension) 12/20/2011  . Dyslipidemia  12/20/2011  . Cardiomyopathy   . History of GI bleed   . Atrial fibrillation   . Sigmoid diverticulosis   . Anemia, iron deficiency 10/24/2012  . Constipation due to pain medication 10/24/2012  . Cataract     "coming back in both eyes" (06/19/2013)  . Chronic bronchitis     "get it q yr" (06/19/2013)  . Shortness of breath     "comes on at anytime" (06/19/2013)  . OSA on CPAP 12/20/2011  . Type II diabetes mellitus   . DJD (degenerative joint disease)   . Osteoarthritis   . Arthritis     "all over my body" (06/19/2013)  . Chronic back pain     "all over my back" (06/19/2013)  . Nephrolithiasis     bilateral   Past Surgical History  Procedure  Laterality Date  . Back surgery    . Appendectomy    . Fracture surgery    . Dilation and curettage of uterus    . Coronary angioplasty    . Esophagogastroduodenoscopy    . Colonoscopy    . Eus    . Esophagogastroduodenoscopy  02/21/2012    Procedure: ESOPHAGOGASTRODUODENOSCOPY (EGD);  Surgeon: Gatha Mayer, MD;  Location: [facility];  Service: Endoscopy;  Laterality: N/A;  pt. being evaluated for a pacemaker  . Balloon dilation  02/21/2012    Procedure: BALLOON DILATION;  Surgeon: Gatha Mayer, MD;  Location: [facility];  Service: Endoscopy;  Laterality: N/A;  . Colonoscopy N/A 10/24/2012    Procedure: COLONOSCOPY;  Surgeon: Gatha Mayer, MD;  Location: [facility];  Service: Endoscopy;  Laterality: N/A;  . Coronary angioplasty  03-07-2004    cutting balloon  . Cardiac catheterization  06/05/2004    high grade disease AV groove CX  . Cardiac catheterization  04/19/2005    noncritical CAD  . US echocardiography  01/25/2008    mild DUST,mild MR,mod. ca+ mitral annular,AOV mildly sclerotid  . Lexiscan mycardial perfusion scan  01/25/2008    Normal  . Inguinal hernia repair Right 06/18/2013  . Tonsillectomy    .  Total abdominal hysterectomy    . Cataract extraction w/ intraocular lens  implant, bilateral Bilateral   . Breast biopsy Right   . Breast lumpectomy Right   . Inguinal hernia repair Right 06/18/2013    Procedure: HERNIA REPAIR INGUINAL ADULT;  Surgeon: Gwenyth Ober, MD;  Location: [facility];  Service: General;  Laterality: Right;  . Insertion of mesh Right 06/18/2013    Procedure: INSERTION OF MESH;  Surgeon: Gwenyth Ober, MD;  Location: [facility];  Service: General;  Laterality: Right;   Family History  Problem Relation Age of Onset  . Colon cancer Mother   . Heart disease Mother   . Stroke Mother    History  Substance Use Topics  . Smoking status: Never Smoker   . Smokeless tobacco: Never Used  . Alcohol Use: No   OB History   Grav Para Term Preterm Abortions  TAB SAB Ect Mult Living                 Review of Systems  Musculoskeletal:       R hip pain   All other systems reviewed and are negative.     Allergies  Chicken allergy; Fish allergy; and Percocet  Home Medications   Prior to Admission medications   Medication Sig Start Date End Date Taking? Authorizing Provider  amLODipine (NORVASC) 10 MG tablet Take 10 mg by mouth daily.    Historical Provider, MD  aspirin 81 MG chewable tablet Chew 81 mg by mouth daily.    Historical Provider, MD  benzonatate (TESSALON) 100 MG capsule Take 1 capsule (100 mg total) by mouth 3 (three) times daily as needed for cough. 08/23/13   Carmin Muskrat, MD  calcium-vitamin D (OSCAL WITH D) 500-200 MG-UNIT per tablet Take 1 tablet by mouth daily.    Historical Provider, MD  cetirizine (ZYRTEC) 5 MG tablet Take 5 mg by mouth daily.    Historical Provider, MD  cholecalciferol (VITAMIN D) 400 UNITS TABS tablet Take 800 Units by mouth daily.    Historical Provider, MD  citalopram (CELEXA) 20 MG tablet Take 20 mg by mouth daily before breakfast.     Historical Provider, MD  docusate sodium (COLACE) 100 MG capsule Take 100 mg by mouth 2 (two) times daily.    Historical Provider, MD  esomeprazole (NEXIUM) 40 MG capsule Take 40 mg by mouth daily before breakfast.    Historical Provider, MD  fluticasone (FLONASE) 50 MCG/ACT nasal spray Place 2 sprays into both nostrils daily.     Historical Provider, MD  furosemide (LASIX) 40 MG tablet Take 1 tablet (40 mg total) by mouth daily. 09/06/13   Cecilie Kicks, NP  guaiFENesin (MUCINEX) 600 MG 12 hr tablet Take 1,200 mg by mouth 2 (two) times daily. For 10 days    Historical Provider, MD  hydrALAZINE (APRESOLINE) 25 MG tablet Take 25 mg by mouth 3 (three) times daily.    Historical Provider, MD  isosorbide mononitrate (IMDUR) 60 MG 24 hr tablet Take 1 tablet (60 mg total) by mouth daily before breakfast. 11/16/12   Cecilie Kicks, NP  ketorolac (ACULAR) 0.4 % SOLN Place 1 drop  into both eyes daily.     Historical Provider, MD  losartan-hydrochlorothiazide (HYZAAR) 100-25 MG per tablet Take 1 tablet by mouth daily before breakfast.     Historical Provider, MD  lubiprostone (AMITIZA) 24 MCG capsule Take 1 capsule (24 mcg total) by mouth 2 (two) times daily with a meal. 10/24/12   Glendell Docker  Simonne Maffucci, MD  metoCLOPramide (REGLAN) 5 MG tablet Take 5 mg by mouth 4 (four) times daily -  before meals and at bedtime.     Historical Provider, MD  mirabegron ER (MYRBETRIQ) 50 MG TB24 tablet Take 50 mg by mouth daily.    Historical Provider, MD  nitroGLYCERIN (NITROLINGUAL) 0.4 MG/SPRAY spray Place 2 sprays under the tongue every 5 (five) minutes as needed. Use 2 sprays under tongue as needed for chest pain. Repeat 2 sprays if pain persists after 15 minutes if pain still persists    Historical Provider, MD  Olopatadine HCl (PATADAY) 0.2 % SOLN Place 1 drop into both eyes 2 (two) times daily.     Historical Provider, MD  ondansetron (ZOFRAN) 4 MG tablet Take 1 tablet (4 mg total) by mouth every 6 (six) hours as needed for nausea. 06/20/13   Gwenyth Ober, MD  polyethylene glycol [facility] / Floria Raveling) packet Take 17 g by mouth daily. Mix in 8 oz of suitable liquid and drink by mouth every day    Historical Provider, MD  potassium chloride (K-DUR) 10 MEQ tablet Take 10 mEq by mouth 2 (two) times daily. Do not crush    Historical Provider, MD  rosuvastatin (CRESTOR) 5 MG tablet Take 5 mg by mouth at bedtime.     Historical Provider, MD  senna (SENOKOT) 8.6 MG TABS Take 1 tablet by mouth 2 (two) times daily.    Historical Provider, MD  shark liver oil-cocoa butter (PREPARATION H) 0.25-3-85.5 % suppository Place 1 suppository rectally 2 (two) times daily as needed for hemorrhoids.    Historical Provider, MD  spironolactone (ALDACTONE) 25 MG tablet Take 25 mg by mouth daily before breakfast.     Historical Provider, MD  traMADol (ULTRAM) 50 MG tablet Take 1 tablet (50 mg total) by mouth every 6 (six)  hours as needed for moderate pain. 08/23/13   Carmin Muskrat, MD   BP 145/70  Pulse 64  Temp(Src) 98.8 F (37.1 C) (Oral)  Resp 18  SpO2 98% Physical Exam  Nursing note and vitals reviewed. Constitutional:  Chronically ill, slightly uncomfortable   HENT:  Head: Normocephalic and atraumatic.  Mouth/Throat: Oropharynx is clear and moist.  Eyes: Conjunctivae and EOM are normal. Pupils are equal, round, and reactive to light.  Neck: Normal range of motion. Neck supple.  Cardiovascular: Normal rate, regular rhythm and normal heart sounds.   Pulmonary/Chest: Effort normal and breath sounds normal. No respiratory distress. She has no wheezes. She has no rales.  Abdominal: Soft. Bowel sounds are normal. She exhibits no distension. There is no tenderness. There is no rebound and no guarding.  Musculoskeletal: Normal range of motion.  Pelvis stable. Mild tenderness R hip and femur but nl ROM of the hip. No obvious deformity. Mild tenderness on R knee palpation but no swelling or deformity. Neurovascular intact. No other extremity trauma   Neurological: She is alert.  Demented, moving all extremities   Skin: Skin is warm and dry.  Psychiatric:  Unable     ED Course  Procedures (including critical care time) Labs Review Labs Reviewed  CBC WITH DIFFERENTIAL - Abnormal; Notable for the following:    WBC 3.9 (*)    RBC 3.54 (*)    Hemoglobin 10.1 (*)    HCT 31.0 (*)    Neutrophils Relative % 40 (*)    Neutro Abs 1.6 (*)    Monocytes Relative 17 (*)    All other components within normal limits  BASIC  METABOLIC PANEL - Abnormal; Notable for the following:    Glucose, Bld 115 (*)    GFR calc non Af Amer 56 (*)    GFR calc Af Amer 65 (*)    All other components within normal limits  URINE CULTURE  URINALYSIS, ROUTINE W REFLEX MICROSCOPIC  I-STAT TROPOININ, ED    Imaging Review Dg Chest 1 View  10/28/2013   CLINICAL DATA:  Status post fall last week, right hip pain, no chest  complaints  EXAM: CHEST - 1 VIEW  COMPARISON:  DG CHEST 2 VIEW dated 08/23/2013; Dossie Der* dated 10/28/2013  FINDINGS: There is no focal parenchymal opacity, pleural effusion, or pneumothorax. The heart and mediastinal contours are unremarkable. There is thoracic aortic atherosclerosis.  The osseous structures are unremarkable.  IMPRESSION: No active disease.   Electronically Signed   By: Kathreen Devoid   On: 10/28/2013 18:03   Dg Pelvis 1-2 Views  10/28/2013   CLINICAL DATA:  Right hip and leg pain following fall last week.  EXAM: PELVIS - 1-2 VIEW  COMPARISON:  Pelvis CT dated 02/28/2013.  FINDINGS: Stable degenerative changes involving both hips. No fracture or dislocation seen.  IMPRESSION: Bilateral hip degenerative changes.  No fracture or dislocation.   Electronically Signed   By: Enrique Sack M.D.   On: 10/28/2013 18:05   Dg Femur Right  10/28/2013   CLINICAL DATA:  Right hip and leg pain following a fall last week.  EXAM: RIGHT FEMUR - 2 VIEW  COMPARISON:  Pelvis radiograph obtained at the same time.  FINDINGS: Previously noted chronic degenerative changes of the right hip with no femur fracture or dislocation seen. Right knee degenerative changes. Atheromatous arterial calcifications. Diffuse osteopenia.  IMPRESSION: No fracture or dislocation. Right hip and right knee degenerative changes.   Electronically Signed   By: Enrique Sack M.D.   On: 10/28/2013 18:09     EKG Interpretation   Date/Time:  Sunday Oct 28 2013 17:23:24 EDT Ventricular Rate:  64 PR Interval:  179 QRS Duration: 106 QT Interval:  440 QTC Calculation: [id number] R Axis:   -9 Text Interpretation:  Sinus rhythm No significant change since last  tracing Confirmed by Jadelyn Elks  MD, Shakiyah Cirilo ([id number]) on 10/28/2013 5:33:05 PM      MDM   Final diagnoses:  None    Salah Tines is a 80 y.o. female here with R hip pain s/p fall. Likely contusion vs pelvic fracture. She has been ambulating so I doubt hip fracture. Also possible syncope a week  ago. Will get labs, EKG, xrays.   6:50 PM EKG unchanged. Xray showed arthritis. She has been walking on it so I doubt occult fracture. Labs at baseline. UA nl. Will d/c back to facility. Continue tramadol.    Wandra Arthurs, MD 10/28/13 [phone]

## 2013-10-29 LAB — URINE CULTURE
Colony Count: NO GROWTH
Culture: NO GROWTH

## 2014-03-05 ENCOUNTER — Ambulatory Visit (INDEPENDENT_AMBULATORY_CARE_PROVIDER_SITE_OTHER): Payer: Medicare Other | Admitting: Cardiovascular Disease

## 2014-03-05 ENCOUNTER — Encounter: Payer: Self-pay | Admitting: Cardiovascular Disease

## 2014-03-05 VITALS — BP 130/60 | HR 58 | Ht 64.5 in | Wt 160.0 lb

## 2014-03-05 DIAGNOSIS — I10 Essential (primary) hypertension: Secondary | ICD-10-CM

## 2014-03-05 DIAGNOSIS — I25118 Atherosclerotic heart disease of native coronary artery with other forms of angina pectoris: Secondary | ICD-10-CM

## 2014-03-05 DIAGNOSIS — E785 Hyperlipidemia, unspecified: Secondary | ICD-10-CM

## 2014-03-05 DIAGNOSIS — I2510 Atherosclerotic heart disease of native coronary artery without angina pectoris: Secondary | ICD-10-CM

## 2014-03-05 DIAGNOSIS — I209 Angina pectoris, unspecified: Secondary | ICD-10-CM

## 2014-03-05 NOTE — Assessment & Plan Note (Signed)
History of CAD status post cutting balloon atherectomy of the AV groove circumflex by myself at 2007. Her other arteries were without significant disease and her heart function was normal. She did have a Myoview stress t 11/30/12 which is nonischemic because of atypical chest pain.est

## 2014-03-05 NOTE — Assessment & Plan Note (Signed)
Controlled on current medications 

## 2014-03-05 NOTE — Progress Notes (Signed)
03/05/2014 [name]   13-Jul-1926  [id number]  Primary Physician [name] Poll, Mays Primary Cardiologist: [name] Renae Gloss   HPI:  The patient is an 80 year old moderately overweight widowed [place] American female with no children who was last seen by me 12 months ago and by Cecilie Kicks 6 months ago. She has a history of hypertension and bradycardia. She also has known CAD, status post cutting balloon atherectomy of an AV groove circumflex in 2007 by me with normal LV function. She does have some dementia as well as GERD but otherwise is asymptomatic.she saw Cecilie Kicks registered nurse practitioner in the office several months ago complaining of chest pain which was nitrate responsive. A subsequent Myoview stress test was normal. On further questioning her pains seems to occur after eating vegetables suggesting a gastrointestinal etiology vascular profile performed 09/27/12 revealed the left of 86, LDL 29 and HDL of 39.Since she was here last was seen here by Cecilie Kicks registered nurse practitioner,she has had no change in her clinical status.    Current Outpatient Prescriptions  Medication Sig Dispense Refill  . amLODipine (NORVASC) 10 MG tablet Take 10 mg by mouth daily.      Marland Kitchen aspirin 81 MG chewable tablet Chew 81 mg by mouth daily.      . calcium-vitamin D (OSCAL WITH D) 500-200 MG-UNIT per tablet Take 1 tablet by mouth daily.      . cetirizine (ZYRTEC) 5 MG tablet Take 5 mg by mouth daily.      . cholecalciferol (VITAMIN D) 400 UNITS TABS tablet Take 800 Units by mouth daily.      . citalopram (CELEXA) 20 MG tablet Take 20 mg by mouth daily before breakfast.       . docusate sodium (COLACE) 100 MG capsule Take 100 mg by mouth 2 (two) times daily.      Marland Kitchen esomeprazole (NEXIUM) 40 MG capsule Take 40 mg by mouth daily before breakfast.      . fluticasone (FLONASE) 50 MCG/ACT nasal spray Place 2 sprays into both nostrils daily.       . hydrALAZINE  (APRESOLINE) 25 MG tablet Take 25 mg by mouth 3 (three) times daily.      . isosorbide mononitrate (IMDUR) 60 MG 24 hr tablet Take 1 tablet (60 mg total) by mouth daily before breakfast.  30 tablet  6  . ketorolac (ACULAR) 0.4 % SOLN Place 1 drop into both eyes daily.       Marland Kitchen losartan-hydrochlorothiazide (HYZAAR) 100-25 MG per tablet Take 1 tablet by mouth daily before breakfast.       . lubiprostone (AMITIZA) 24 MCG capsule Take 1 capsule (24 mcg total) by mouth 2 (two) times daily with a meal.  60 capsule  11  . metoCLOPramide (REGLAN) 5 MG tablet Take 5 mg by mouth 4 (four) times daily -  before meals and at bedtime.       . mirabegron ER (MYRBETRIQ) 50 MG TB24 tablet Take 50 mg by mouth daily.      . nitroGLYCERIN (NITROLINGUAL) 0.4 MG/SPRAY spray Place 2 sprays under the tongue every 5 (five) minutes as needed. Use 2 sprays under tongue as needed for chest pain. Repeat 2 sprays if pain persists after 15 minutes if pain still persists      . NON FORMULARY CPAP at night      . Olopatadine HCl (PATADAY) 0.2 % SOLN Place 1 drop into both eyes 2 (two) times daily.       Marland Kitchen  polyethylene glycol (MIRALAX / GLYCOLAX) packet Take 17 g by mouth daily. Mix in 8 oz of suitable liquid and drink by mouth every day      . rosuvastatin (CRESTOR) 5 MG tablet Take 5 mg by mouth at bedtime.       . senna (SENOKOT) 8.6 MG TABS Take 1 tablet by mouth 2 (two) times daily.      Marland Kitchen spironolactone (ALDACTONE) 25 MG tablet Take 25 mg by mouth daily before breakfast.        No current facility-administered medications for this visit.    Allergies  Allergen Reactions  . Chicken Allergy     unknown  . Fish Allergy     unknown  . Percocet [Oxycodone-Acetaminophen] Itching    History   Social History  . Marital Status: Widowed    Spouse Name: N/A    Number of Children: 0  . Years of Education: N/A   Occupational History  . Not on file.   Social History Main Topics  . Smoking status: Never Smoker   .  Smokeless tobacco: Never Used  . Alcohol Use: No  . Drug Use: No  . Sexual Activity: No   Other Topics Concern  . 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 She raised many of her nieces and nephews, she also has a sister in [place]. Never smoker never alcohol.     Review of Systems: General: negative for chills, fever, night sweats or weight changes.  Cardiovascular: negative for chest pain, dyspnea on exertion, edema, orthopnea, palpitations, paroxysmal nocturnal dyspnea or shortness of breath Dermatological: negative for rash Respiratory: negative for cough or wheezing Urologic: negative for hematuria Abdominal: negative for nausea, vomiting, diarrhea, bright red blood per rectum, melena, or hematemesis Neurologic: negative for visual changes, syncope, or dizziness All other systems reviewed and are otherwise negative except as noted above.    Blood pressure 130/60, pulse 58, height 5' 4.5" (1.638 m), weight 160 lb (72.576 kg).  General appearance: alert and no distress Neck: no adenopathy, no carotid bruit, no JVD, supple, symmetrical, trachea midline and thyroid not enlarged, symmetric, no tenderness/mass/nodules Lungs: clear to auscultation bilaterally Heart: regular rate and rhythm, S1, S2 normal, no murmur, click, rub or gallop Extremities: extremities normal, atraumatic, no cyanosis or edema  EKG sinus bradycardia at 58 without ST or T wave changes  ASSESSMENT AND PLAN:   CAD (coronary artery disease), cutting balloon athrectomy of her dominant AV groove of LCX.  2007 History of CAD status post cutting balloon atherectomy of the AV groove circumflex by myself at 2007. Her other arteries were without significant disease and her heart function was normal. She did have a Myoview stress t 11/30/12 which is nonischemic because of atypical chest pain.est  HTN (hypertension) Controlled on current medications  Dyslipidemia  On  statin therapy followed by her PCP      [name] [facility], [facility] 03/05/2014 11:35 AM

## 2014-03-05 NOTE — Patient Instructions (Signed)
Dr Gwenlyn Found recommends that you schedule a follow-up appointment in 6 months with an extender - Cecilie Kicks, NP.  Your physician wants you to follow-up in 12 months. You will receive a reminder letter in the mail two months in advance. If you don't receive a letter, please call our office to schedule the follow-up appointment.

## 2014-03-05 NOTE — Assessment & Plan Note (Signed)
On statin therapy followed by her PCP 

## 2014-04-22 ENCOUNTER — Emergency Department (HOSPITAL_COMMUNITY)
Admission: EM | Admit: 2014-04-22 | Discharge: 2014-04-23 | Disposition: A | Discharge status: Home / Self Care | Payer: Medicare Other | Attending: Emergency Medicine | Admitting: Emergency Medicine

## 2014-04-22 ENCOUNTER — Encounter (HOSPITAL_COMMUNITY): Payer: Self-pay | Admitting: Emergency Medicine

## 2014-04-22 ENCOUNTER — Emergency Department (HOSPITAL_COMMUNITY): Payer: Medicare Other

## 2014-04-22 DIAGNOSIS — N390 Urinary tract infection, site not specified: Secondary | ICD-10-CM | POA: Diagnosis not present

## 2014-04-22 DIAGNOSIS — I2510 Atherosclerotic heart disease of native coronary artery without angina pectoris: Secondary | ICD-10-CM | POA: Diagnosis not present

## 2014-04-22 DIAGNOSIS — E669 Obesity, unspecified: Secondary | ICD-10-CM | POA: Diagnosis not present

## 2014-04-22 DIAGNOSIS — Z791 Long term (current) use of non-steroidal anti-inflammatories (NSAID): Secondary | ICD-10-CM | POA: Diagnosis not present

## 2014-04-22 DIAGNOSIS — Z87442 Personal history of urinary calculi: Secondary | ICD-10-CM | POA: Insufficient documentation

## 2014-04-22 DIAGNOSIS — G8929 Other chronic pain: Secondary | ICD-10-CM | POA: Diagnosis not present

## 2014-04-22 DIAGNOSIS — I10 Essential (primary) hypertension: Secondary | ICD-10-CM | POA: Diagnosis not present

## 2014-04-22 DIAGNOSIS — I252 Old myocardial infarction: Secondary | ICD-10-CM | POA: Insufficient documentation

## 2014-04-22 DIAGNOSIS — E785 Hyperlipidemia, unspecified: Secondary | ICD-10-CM | POA: Diagnosis not present

## 2014-04-22 DIAGNOSIS — Z79899 Other long term (current) drug therapy: Secondary | ICD-10-CM | POA: Diagnosis not present

## 2014-04-22 DIAGNOSIS — Z9861 Coronary angioplasty status: Secondary | ICD-10-CM | POA: Diagnosis not present

## 2014-04-22 DIAGNOSIS — R05 Cough: Secondary | ICD-10-CM | POA: Diagnosis not present

## 2014-04-22 DIAGNOSIS — Z7951 Long term (current) use of inhaled steroids: Secondary | ICD-10-CM | POA: Diagnosis not present

## 2014-04-22 DIAGNOSIS — Z86010 Personal history of colonic polyps: Secondary | ICD-10-CM | POA: Diagnosis not present

## 2014-04-22 DIAGNOSIS — R079 Chest pain, unspecified: Secondary | ICD-10-CM | POA: Diagnosis not present

## 2014-04-22 DIAGNOSIS — Z7982 Long term (current) use of aspirin: Secondary | ICD-10-CM | POA: Diagnosis not present

## 2014-04-22 DIAGNOSIS — Z8709 Personal history of other diseases of the respiratory system: Secondary | ICD-10-CM | POA: Diagnosis not present

## 2014-04-22 DIAGNOSIS — R0602 Shortness of breath: Secondary | ICD-10-CM | POA: Insufficient documentation

## 2014-04-22 DIAGNOSIS — M1990 Unspecified osteoarthritis, unspecified site: Secondary | ICD-10-CM | POA: Diagnosis not present

## 2014-04-22 DIAGNOSIS — Z862 Personal history of diseases of the blood and blood-forming organs and certain disorders involving the immune mechanism: Secondary | ICD-10-CM | POA: Diagnosis not present

## 2014-04-22 DIAGNOSIS — K5900 Constipation, unspecified: Secondary | ICD-10-CM | POA: Diagnosis not present

## 2014-04-22 DIAGNOSIS — K219 Gastro-esophageal reflux disease without esophagitis: Secondary | ICD-10-CM | POA: Insufficient documentation

## 2014-04-22 DIAGNOSIS — E119 Type 2 diabetes mellitus without complications: Secondary | ICD-10-CM | POA: Diagnosis not present

## 2014-04-22 DIAGNOSIS — Z9981 Dependence on supplemental oxygen: Secondary | ICD-10-CM | POA: Diagnosis not present

## 2014-04-22 DIAGNOSIS — R194 Change in bowel habit: Secondary | ICD-10-CM

## 2014-04-22 DIAGNOSIS — F0390 Unspecified dementia without behavioral disturbance: Secondary | ICD-10-CM | POA: Diagnosis not present

## 2014-04-22 DIAGNOSIS — I509 Heart failure, unspecified: Secondary | ICD-10-CM | POA: Insufficient documentation

## 2014-04-22 DIAGNOSIS — G4733 Obstructive sleep apnea (adult) (pediatric): Secondary | ICD-10-CM | POA: Insufficient documentation

## 2014-04-22 DIAGNOSIS — Z9889 Other specified postprocedural states: Secondary | ICD-10-CM | POA: Insufficient documentation

## 2014-04-22 DIAGNOSIS — F419 Anxiety disorder, unspecified: Secondary | ICD-10-CM | POA: Insufficient documentation

## 2014-04-22 LAB — CBC WITH DIFFERENTIAL/PLATELET
BASOS ABS: 0 10*3/uL (ref 0.0–0.1)
Basophils Relative: 1 % (ref 0–1)
EOS PCT: 5 % (ref 0–5)
Eosinophils Absolute: 0.2 10*3/uL (ref 0.0–0.7)
HCT: 33.6 % — ABNORMAL LOW (ref 36.0–46.0)
Hemoglobin: 11.2 g/dL — ABNORMAL LOW (ref 12.0–15.0)
Lymphocytes Relative: 34 % (ref 12–46)
Lymphs Abs: 1.3 10*3/uL (ref 0.7–4.0)
MCH: 28.9 pg (ref 26.0–34.0)
MCHC: 33.3 g/dL (ref 30.0–36.0)
MCV: 86.8 fL (ref 78.0–100.0)
Monocytes Absolute: 0.6 10*3/uL (ref 0.1–1.0)
Monocytes Relative: 17 % — ABNORMAL HIGH (ref 3–12)
Neutro Abs: 1.7 10*3/uL (ref 1.7–7.7)
Neutrophils Relative %: 43 % (ref 43–77)
PLATELETS: 205 10*3/uL (ref 150–400)
RBC: 3.87 MIL/uL (ref 3.87–5.11)
RDW: 14 % (ref 11.5–15.5)
WBC: 3.9 10*3/uL — AB (ref 4.0–10.5)

## 2014-04-22 LAB — TROPONIN I: Troponin I: <0.3 ng/mL (ref <0.30)

## 2014-04-22 LAB — BASIC METABOLIC PANEL
ANION GAP: 15 (ref 5–15)
BUN: 13 mg/dL (ref 6–23)
CALCIUM: 9.3 mg/dL (ref 8.4–10.5)
CO2: 21 mEq/L (ref 19–32)
Chloride: 102 mEq/L (ref 96–112)
Creatinine, Ser: 0.77 mg/dL (ref 0.50–1.10)
GFR, EST AFRICAN AMERICAN: 86 mL/min — AB (ref >90)
GFR, EST NON AFRICAN AMERICAN: 74 mL/min — AB (ref >90)
Glucose, Bld: 99 mg/dL (ref 70–99)
Potassium: 3.7 mEq/L (ref 3.7–5.3)
SODIUM: 138 meq/L (ref 137–147)

## 2014-04-22 LAB — URINE MICROSCOPIC-ADD ON

## 2014-04-22 LAB — URINALYSIS, ROUTINE W REFLEX MICROSCOPIC
BILIRUBIN URINE: NEGATIVE
Glucose, UA: NEGATIVE mg/dL
Hgb urine dipstick: NEGATIVE
Ketones, ur: NEGATIVE mg/dL
Nitrite: NEGATIVE
Protein, ur: NEGATIVE mg/dL
Specific Gravity, Urine: 1.004 — ABNORMAL LOW (ref 1.005–1.030)
UROBILINOGEN UA: 0.2 mg/dL (ref 0.0–1.0)
pH: 7 (ref 5.0–8.0)

## 2014-04-22 LAB — PRO B NATRIURETIC PEPTIDE: Pro B Natriuretic peptide (BNP): 103.1 pg/mL (ref 0–450)

## 2014-04-22 MED ORDER — AMLODIPINE BESYLATE 5 MG PO TABS
10.0000 mg | ORAL_TABLET | Freq: Once | ORAL | Status: AC
  Administered 2014-04-23: 10 mg via ORAL
  Filled 2014-04-22: qty 2

## 2014-04-22 MED ORDER — MORPHINE SULFATE 4 MG/ML IJ SOLN
2.0000 mg | Freq: Once | INTRAMUSCULAR | Status: AC
  Administered 2014-04-22: 2 mg via INTRAVENOUS
  Filled 2014-04-22: qty 1

## 2014-04-22 NOTE — ED Notes (Addendum)
PT monitored by pulse ox, bp cuff, and 12-lead.  RN and PA-C informed for HTN and that pt is reporting chest pain.

## 2014-04-22 NOTE — ED Notes (Signed)
Patient transported to X-ray 

## 2014-04-22 NOTE — ED Notes (Signed)
[facility]- Pt from [facility], had chest pain beginning at approximately 12:00 right after lunch today. Pt states it radiates into left arm. No shortness of breath, pt reported to EMS she felt some weakness today. Pt alert and oriented on arrival. Pt received 324 of ASA at approximately 6pm, 2 nitro sprays, one at 1600 and the other at 1800, no change to 10/10 CP.

## 2014-04-22 NOTE — ED Provider Notes (Signed)
CSN: [id number]     Arrival date & time 04/22/14  1847 History   First MD Initiated Contact with Patient 04/22/14 1850     Chief Complaint  Patient presents with  . Chest Pain     (Consider location/radiation/quality/duration/timing/severity/associated sxs/prior Treatment) Patient is a 80 y.o. female presenting with chest pain. The history is provided by the patient, medical records and the EMS personnel.  Chest Pain Associated symptoms: cough and shortness of breath    This is an 80 year old female with past medical history significant for hypertension, hyperlipidemia, DM2, congestive heart failure, coronary artery disease s/p status post cutting balloon atherectomy of an AV groove circumflex in 2007, prior MI, presenting to the ED from [facility] for chest pain.  Patient states pain began around noon today after eating lunch-- notably she had salad with cucumbers and tomatoes.  States radiation into the left arm and jaw, associated with SOB, diaphoresis, and generalized weakness.  Patient denies nausea or vomiting.  No alleviating or exacerbating factors noted.  Patient was given 325 ASA and 2 NTG sprays without improvement of her pain.  States it is quite abnormal for her to get CP like this, last episode was approx 1 year ago.  Patient notes recent dry cough.  No fever or chills.  Patient has had her flu shot this year.  Patient has extensive cardiac hx, followed by Dr. Gwenlyn Found.  Patient also complains of constipation. States she has not had a normal bowel movement in approximately 3 weeks. She also notes some dysuria, but denies hematuria. She denies any flank pain.  States she was taken off of her narcotic pain medications because of similar episodes.  She is on multiple stool softeners but does not seem to be helping.  VS stable on arrival.  Past Medical History  Diagnosis Date  . Coronary artery disease   . Dementia   . Hypertension   . Hyperlipidemia   . Anxiety   . GERD  (gastroesophageal reflux disease)   . CHF (congestive heart failure)   . Polyp, stomach 11/11/2006  . Aphakia of left eye   . Obese   . MI (myocardial infarction)   . Chest pain at rest 12/20/2011  . CAD (coronary artery disease), cutting balloon athrectomy of her dominant AV groove of LCX.  2007 12/20/2011  . Bradycardia 12/20/2011  . HTN (hypertension) 12/20/2011  . Dyslipidemia  12/20/2011  . Cardiomyopathy   . History of GI bleed   . Atrial fibrillation   . Sigmoid diverticulosis   . Anemia, iron deficiency 10/24/2012  . Constipation due to pain medication 10/24/2012  . Cataract     "coming back in both eyes" (06/19/2013)  . Chronic bronchitis     "get it q yr" (06/19/2013)  . Shortness of breath     "comes on at anytime" (06/19/2013)  . OSA on CPAP 12/20/2011  . Type II diabetes mellitus   . DJD (degenerative joint disease)   . Osteoarthritis   . Arthritis     "all over my body" (06/19/2013)  . Chronic back pain     "all over my back" (06/19/2013)  . Nephrolithiasis     bilateral   Past Surgical History  Procedure Laterality Date  . Back surgery    . Appendectomy    . Fracture surgery    . Dilation and curettage of uterus    . Coronary angioplasty    . Esophagogastroduodenoscopy    . Colonoscopy    . Eus    .  Esophagogastroduodenoscopy  02/21/2012    Procedure: ESOPHAGOGASTRODUODENOSCOPY (EGD);  Surgeon: Gatha Mayer, MD;  Location: [facility];  Service: Endoscopy;  Laterality: N/A;  pt. being evaluated for a pacemaker  . Balloon dilation  02/21/2012    Procedure: BALLOON DILATION;  Surgeon: Gatha Mayer, MD;  Location: [facility];  Service: Endoscopy;  Laterality: N/A;  . Colonoscopy N/A 10/24/2012    Procedure: COLONOSCOPY;  Surgeon: Gatha Mayer, MD;  Location: [facility];  Service: Endoscopy;  Laterality: N/A;  . Coronary angioplasty  03-07-2004    cutting balloon  . Cardiac catheterization  06/05/2004    high grade disease AV groove CX  . Cardiac  catheterization  04/19/2005    noncritical CAD  . US echocardiography  01/25/2008    mild DUST,mild MR,mod. ca+ mitral annular,AOV mildly sclerotid  . Lexiscan mycardial perfusion scan  01/25/2008    Normal  . Inguinal hernia repair Right 06/18/2013  . Tonsillectomy    . Total abdominal hysterectomy    . Cataract extraction w/ intraocular lens  implant, bilateral Bilateral   . Breast biopsy Right   . Breast lumpectomy Right   . Inguinal hernia repair Right 06/18/2013    Procedure: HERNIA REPAIR INGUINAL ADULT;  Surgeon: Gwenyth Ober, MD;  Location: [facility];  Service: General;  Laterality: Right;  . Insertion of mesh Right 06/18/2013    Procedure: INSERTION OF MESH;  Surgeon: Gwenyth Ober, MD;  Location: [facility];  Service: General;  Laterality: Right;   Family History  Problem Relation Age of Onset  . Colon cancer Mother   . Heart disease Mother   . Stroke Mother    History  Substance Use Topics  . Smoking status: Never Smoker   . Smokeless tobacco: Never Used  . Alcohol Use: No   OB History   Grav Para Term Preterm Abortions TAB SAB Ect Mult Living                 Review of Systems  Respiratory: Positive for cough and shortness of breath.   Cardiovascular: Positive for chest pain.  All other systems reviewed and are negative.     Allergies  Chicken allergy; Fish allergy; and Percocet  Home Medications   Prior to Admission medications   Medication Sig Start Date End Date Taking? Authorizing Provider  amLODipine (NORVASC) 10 MG tablet Take 10 mg by mouth daily.    Historical Provider, MD  aspirin 81 MG chewable tablet Chew 81 mg by mouth daily.    Historical Provider, MD  calcium-vitamin D (OSCAL WITH D) 500-200 MG-UNIT per tablet Take 1 tablet by mouth daily.    Historical Provider, MD  cetirizine (ZYRTEC) 5 MG tablet Take 5 mg by mouth daily.    Historical Provider, MD  cholecalciferol (VITAMIN D) 400 UNITS TABS tablet Take 800 Units by mouth daily.    Historical  Provider, MD  citalopram (CELEXA) 20 MG tablet Take 20 mg by mouth daily before breakfast.     Historical Provider, MD  docusate sodium (COLACE) 100 MG capsule Take 100 mg by mouth 2 (two) times daily.    Historical Provider, MD  esomeprazole (NEXIUM) 40 MG capsule Take 40 mg by mouth daily before breakfast.    Historical Provider, MD  fluticasone (FLONASE) 50 MCG/ACT nasal spray Place 2 sprays into both nostrils daily.     Historical Provider, MD  hydrALAZINE (APRESOLINE) 25 MG tablet Take 25 mg by mouth 3 (three) times daily.    Historical  Provider, MD  isosorbide mononitrate (IMDUR) 60 MG 24 hr tablet Take 1 tablet (60 mg total) by mouth daily before breakfast. 11/16/12   Isaiah Serge, NP  ketorolac (ACULAR) 0.4 % SOLN Place 1 drop into both eyes daily.     Historical Provider, MD  losartan-hydrochlorothiazide (HYZAAR) 100-25 MG per tablet Take 1 tablet by mouth daily before breakfast.     Historical Provider, MD  lubiprostone (AMITIZA) 24 MCG capsule Take 1 capsule (24 mcg total) by mouth 2 (two) times daily with a meal. 10/24/12   Gatha Mayer, MD  metoCLOPramide (REGLAN) 5 MG tablet Take 5 mg by mouth 4 (four) times daily -  before meals and at bedtime.     Historical Provider, MD  mirabegron ER (MYRBETRIQ) 50 MG TB24 tablet Take 50 mg by mouth daily.    Historical Provider, MD  nitroGLYCERIN (NITROLINGUAL) 0.4 MG/SPRAY spray Place 2 sprays under the tongue every 5 (five) minutes as needed. Use 2 sprays under tongue as needed for chest pain. Repeat 2 sprays if pain persists after 15 minutes if pain still persists    Historical Provider, MD  NON FORMULARY CPAP at night    Historical Provider, MD  Olopatadine HCl (PATADAY) 0.2 % SOLN Place 1 drop into both eyes 2 (two) times daily.     Historical Provider, MD  polyethylene glycol (MIRALAX / GLYCOLAX) packet Take 17 g by mouth daily. Mix in 8 oz of suitable liquid and drink by mouth every day    Historical Provider, MD  rosuvastatin (CRESTOR) 5  MG tablet Take 5 mg by mouth at bedtime.     Historical Provider, MD  senna (SENOKOT) 8.6 MG TABS Take 1 tablet by mouth 2 (two) times daily.    Historical Provider, MD  spironolactone (ALDACTONE) 25 MG tablet Take 25 mg by mouth daily before breakfast.     Historical Provider, MD   BP 180/59  Pulse 57  Temp(Src) 98.5 F (36.9 C)  Resp 21  SpO2 98%  Physical Exam  Nursing note and vitals reviewed. Constitutional: She is oriented to person, place, and time. She appears well-developed and well-nourished. No distress.  HENT:  Head: Normocephalic and atraumatic.  Mouth/Throat: Oropharynx is clear and moist.  Eyes: Conjunctivae and EOM are normal. Pupils are equal, round, and reactive to light.  Neck: Normal range of motion. Neck supple.  Cardiovascular: Normal rate, regular rhythm, normal heart sounds, intact distal pulses and normal pulses.   Pulmonary/Chest: Effort normal and breath sounds normal. No respiratory distress. She has no wheezes.  Abdominal: Soft. Bowel sounds are normal. There is no tenderness. There is no guarding and no CVA tenderness.  Abdomen soft, nondistended, no focal tenderness or peritoneal signs.  Musculoskeletal: Normal range of motion. She exhibits no edema.  Neurological: She is alert and oriented to person, place, and time.  Skin: Skin is warm and dry. She is not diaphoretic.  Psychiatric: She has a normal mood and affect.    ED Course  Procedures (including critical care time) Labs Review Labs Reviewed  CBC WITH DIFFERENTIAL - Abnormal; Notable for the following:    WBC 3.9 (*)    Hemoglobin 11.2 (*)    HCT 33.6 (*)    Monocytes Relative 17 (*)    All other components within normal limits  BASIC METABOLIC PANEL - Abnormal; Notable for the following:    GFR calc non Af Amer 74 (*)    GFR calc Af Amer 86 (*)  All other components within normal limits  URINALYSIS, ROUTINE W REFLEX MICROSCOPIC - Abnormal; Notable for the following:    APPearance  CLOUDY (*)    Specific Gravity, Urine 1.004 (*)    Leukocytes, UA TRACE (*)    All other components within normal limits  URINE MICROSCOPIC-ADD ON - Abnormal; Notable for the following:    Squamous Epithelial / LPF FEW (*)    Bacteria, UA MANY (*)    Casts WAXY CAST (*)    All other components within normal limits  URINE CULTURE  PRO B NATRIURETIC PEPTIDE  TROPONIN I  TROPONIN I    Imaging Review Dg Abd Acute W/chest  04/22/2014   CLINICAL DATA:  Chest pain  EXAM: ACUTE ABDOMEN SERIES (ABDOMEN 2 VIEW & CHEST 1 VIEW)  COMPARISON:  10/28/2013 chest radiograph  FINDINGS: Aortic atherosclerosis. Heart size upper normal to mildly enlarged. Mild lung base opacities, favor atelectasis.  Bowel gas pattern nonobstructed. No free intraperitoneal air. Multilevel degenerative changes. Left greater than right hip DJD.  IMPRESSION: Mild lung base opacities, favor atelectasis.  Heart size upper normal to mildly enlarged.  Bowel gas pattern nonobstructive.   Electronically Signed   By: Carlos Levering M.D.   On: 04/22/2014 20:49     EKG Interpretation   Date/Time:  Monday April 22 2014 18:52:16 EDT Ventricular Rate:  56 PR Interval:  194 QRS Duration: 103 QT Interval:  454 QTC Calculation: 438 R Axis:   -21 Text Interpretation:  Sinus rhythm Borderline left axis deviation Minimal  ST elevation, anterior leads no STEMI. NO CHANGE Confirmed by Johnney Killian,  MD, Jeannie Done [phone]) on 04/22/2014 7:01:21 PM      MDM   Final diagnoses:  Constipation  Chest pain, unspecified chest pain type  Change in bowel habits  UTI (lower urinary tract infection)   80 year old female with known cardiac history, presenting to the ED for chest pain beginning after lunch today. On review of medical records, similar episode one year ago after eating vegetables as well. On exam, patient afebrile and nontoxic appearing. She is in no acute distress. EKG sinus rhythm, unchanged from previous.  Patient also complains of  constipation over the past 3 weeks as well as dysuria. She denies any fever or chills. Abdominal exam is benign, no CVA tenderness. Will obtain lab work and acute abdominal series.  Patient declined pain medication at this time.  Lab work overall reassuring. Troponin negative. Acute abdominal series without signs of obstruction or perforation, no chest abnormalities noted.  Patient states she would now like pain medication.  Morphine given.  Given patient's cardiac hx, will obtain delta trop.  U/a pending at this time.  Delta trop negative.  U/a with many bacteria, trace leuks-- will be sent for culture.  Patient re-evaluated, states she feels fine at this time.  She has no current complaints.  Will plan to discharge back to facility with keflex for UTI pending urine culture.  Will request that facility contact Dr. Kennon Holter office regarding today's ED visit.  Patient's BP noted to be elevated, states she was due for her BP meds around 6pm-- dose of home amlodipine given in ED.  No signs of end organ damage today.  Will d/c back to facility via [facility].  Discussed plan with patient, he/she acknowledged understanding and agreed with plan of care.  Return precautions given for new or worsening symptoms.  Case discussed with attending physician, Dr. Johnney Killian, who personally evaluated patient and agrees with plan of care.  Lattie Haw  Chalmers Guest, PA-C 04/23/14 0031

## 2014-04-23 DIAGNOSIS — R079 Chest pain, unspecified: Secondary | ICD-10-CM | POA: Diagnosis not present

## 2014-04-23 MED ORDER — CEPHALEXIN 500 MG PO CAPS: 500.0000 mg | ORAL_CAPSULE | Freq: Four times a day (QID) | ORAL | Status: DC

## 2014-04-23 NOTE — ED Provider Notes (Signed)
Medical screening examination/treatment/procedure(s) were conducted as a shared visit with non-physician practitioner(s) and myself.  I personally evaluated the patient during the encounter.   EKG Interpretation   Date/Time:  Monday April 22 2014 18:52:16 EDT Ventricular Rate:  56 PR Interval:  194 QRS Duration: 103 QT Interval:  454 QTC Calculation: 438 R Axis:   -21 Text Interpretation:  Sinus rhythm Borderline left axis deviation Minimal  ST elevation, anterior leads no STEMI. NO CHANGE Confirmed by Johnney Killian,  MD, Jeannie Done [phone]) on 04/22/2014 7:01:21 PM     I have even with patient and per her history she developed chest pain just after eating lunch. She does state she's had problems similar to this in the past particularly eating vegetables. She works she did have several cucumbers and tomatoes and experience some fullness sensation in her chest which she thought might have had the quality of gas pain.  Examining the patient she is well in appearance without any acute pain at this point in time cardiac and pulmonary examination are within normal limits. Diagnostic tests do not indicate any acute MI. Review of past medical records the patient did have stress testing done last year with similar recent patient. At that point time there is no identified significant abnormality.  Discharge if the patient is safe for continued outpatient management and follow-up.  Charlesetta Shanks, MD 04/23/14 [phone]

## 2014-04-23 NOTE — ED Notes (Signed)
MD Pffeifer informed of hypertension.

## 2014-04-23 NOTE — ED Notes (Signed)
Pt ready for dc back to [facility], report given to [place], pt stable, no c/o pain, [facility] called for transportation.

## 2014-04-23 NOTE — ED Notes (Signed)
Pt changed into clothes and waiting for [facility].

## 2014-04-23 NOTE — Discharge Instructions (Signed)
Take the prescribed medication as directed for UTI. Follow-up with Dr. Gwenlyn Found-- please call his office and notify of ED visit today. Return to the ED for new or worsening symptoms.

## 2014-04-24 LAB — URINE CULTURE: Colony Count: >=100000

## 2014-06-06 ENCOUNTER — Encounter (HOSPITAL_COMMUNITY): Payer: Self-pay | Admitting: Cardiovascular Disease

## 2014-08-09 ENCOUNTER — Observation Stay (HOSPITAL_COMMUNITY)
Admission: EM | Admit: 2014-08-09 | Discharge: 2014-08-12 | Payer: Medicare Other | Attending: Internal Medicine | Admitting: Internal Medicine

## 2014-08-09 ENCOUNTER — Emergency Department (HOSPITAL_COMMUNITY): Payer: Medicare Other

## 2014-08-09 ENCOUNTER — Encounter (HOSPITAL_COMMUNITY): Payer: Self-pay | Admitting: *Deleted

## 2014-08-09 DIAGNOSIS — I252 Old myocardial infarction: Secondary | ICD-10-CM | POA: Insufficient documentation

## 2014-08-09 DIAGNOSIS — E785 Hyperlipidemia, unspecified: Secondary | ICD-10-CM | POA: Diagnosis not present

## 2014-08-09 DIAGNOSIS — E669 Obesity, unspecified: Secondary | ICD-10-CM | POA: Insufficient documentation

## 2014-08-09 DIAGNOSIS — F419 Anxiety disorder, unspecified: Secondary | ICD-10-CM | POA: Insufficient documentation

## 2014-08-09 DIAGNOSIS — K219 Gastro-esophageal reflux disease without esophagitis: Secondary | ICD-10-CM | POA: Insufficient documentation

## 2014-08-09 DIAGNOSIS — K5790 Diverticulosis of intestine, part unspecified, without perforation or abscess without bleeding: Secondary | ICD-10-CM | POA: Diagnosis not present

## 2014-08-09 DIAGNOSIS — Z7982 Long term (current) use of aspirin: Secondary | ICD-10-CM | POA: Insufficient documentation

## 2014-08-09 DIAGNOSIS — R531 Weakness: Secondary | ICD-10-CM | POA: Diagnosis present

## 2014-08-09 DIAGNOSIS — D72819 Decreased white blood cell count, unspecified: Secondary | ICD-10-CM | POA: Diagnosis not present

## 2014-08-09 DIAGNOSIS — E871 Hypo-osmolality and hyponatremia: Secondary | ICD-10-CM | POA: Diagnosis not present

## 2014-08-09 DIAGNOSIS — Z853 Personal history of malignant neoplasm of breast: Secondary | ICD-10-CM | POA: Diagnosis not present

## 2014-08-09 DIAGNOSIS — F0390 Unspecified dementia without behavioral disturbance: Secondary | ICD-10-CM | POA: Diagnosis not present

## 2014-08-09 DIAGNOSIS — Z91018 Allergy to other foods: Secondary | ICD-10-CM | POA: Insufficient documentation

## 2014-08-09 DIAGNOSIS — Z885 Allergy status to narcotic agent status: Secondary | ICD-10-CM | POA: Insufficient documentation

## 2014-08-09 DIAGNOSIS — Z91013 Allergy to seafood: Secondary | ICD-10-CM | POA: Diagnosis not present

## 2014-08-09 DIAGNOSIS — H269 Unspecified cataract: Secondary | ICD-10-CM | POA: Diagnosis not present

## 2014-08-09 DIAGNOSIS — E860 Dehydration: Secondary | ICD-10-CM | POA: Insufficient documentation

## 2014-08-09 DIAGNOSIS — I509 Heart failure, unspecified: Secondary | ICD-10-CM | POA: Diagnosis not present

## 2014-08-09 DIAGNOSIS — M179 Osteoarthritis of knee, unspecified: Secondary | ICD-10-CM | POA: Diagnosis not present

## 2014-08-09 DIAGNOSIS — E119 Type 2 diabetes mellitus without complications: Secondary | ICD-10-CM | POA: Diagnosis not present

## 2014-08-09 DIAGNOSIS — G4733 Obstructive sleep apnea (adult) (pediatric): Secondary | ICD-10-CM | POA: Insufficient documentation

## 2014-08-09 DIAGNOSIS — I10 Essential (primary) hypertension: Secondary | ICD-10-CM | POA: Insufficient documentation

## 2014-08-09 DIAGNOSIS — R52 Pain, unspecified: Secondary | ICD-10-CM | POA: Diagnosis present

## 2014-08-09 DIAGNOSIS — R109 Unspecified abdominal pain: Principal | ICD-10-CM

## 2014-08-09 DIAGNOSIS — R112 Nausea with vomiting, unspecified: Secondary | ICD-10-CM | POA: Insufficient documentation

## 2014-08-09 DIAGNOSIS — R339 Retention of urine, unspecified: Secondary | ICD-10-CM

## 2014-08-09 DIAGNOSIS — I2510 Atherosclerotic heart disease of native coronary artery without angina pectoris: Secondary | ICD-10-CM | POA: Diagnosis not present

## 2014-08-09 DIAGNOSIS — I429 Cardiomyopathy, unspecified: Secondary | ICD-10-CM | POA: Diagnosis not present

## 2014-08-09 DIAGNOSIS — R262 Difficulty in walking, not elsewhere classified: Secondary | ICD-10-CM | POA: Insufficient documentation

## 2014-08-09 DIAGNOSIS — Z9989 Dependence on other enabling machines and devices: Secondary | ICD-10-CM

## 2014-08-09 DIAGNOSIS — Z79899 Other long term (current) drug therapy: Secondary | ICD-10-CM | POA: Insufficient documentation

## 2014-08-09 DIAGNOSIS — K5900 Constipation, unspecified: Secondary | ICD-10-CM | POA: Insufficient documentation

## 2014-08-09 HISTORY — DX: Headache, unspecified: R51.9

## 2014-08-09 HISTORY — DX: Malignant neoplasm of unspecified site of right female breast: C50.911

## 2014-08-09 HISTORY — DX: Headache: R51

## 2014-08-09 LAB — COMPREHENSIVE METABOLIC PANEL
ALK PHOS: 42 U/L (ref 39–117)
ALT: 16 U/L (ref 0–35)
ANION GAP: 7 (ref 5–15)
AST: 23 U/L (ref 0–37)
Albumin: 3.9 g/dL (ref 3.5–5.2)
BUN: 6 mg/dL (ref 6–23)
CO2: 23 mmol/L (ref 19–32)
Calcium: 9.6 mg/dL (ref 8.4–10.5)
Chloride: 101 mmol/L (ref 96–112)
Creatinine, Ser: 0.75 mg/dL (ref 0.50–1.10)
GFR calc Af Amer: 87 mL/min — ABNORMAL LOW (ref >90)
GFR, EST NON AFRICAN AMERICAN: 75 mL/min — AB (ref >90)
Glucose, Bld: 105 mg/dL — ABNORMAL HIGH (ref 70–99)
POTASSIUM: 3.9 mmol/L (ref 3.5–5.1)
SODIUM: 131 mmol/L — AB (ref 135–145)
TOTAL PROTEIN: 7.3 g/dL (ref 6.0–8.3)
Total Bilirubin: 0.8 mg/dL (ref 0.3–1.2)

## 2014-08-09 LAB — I-STAT CG4 LACTIC ACID, ED
LACTIC ACID, VENOUS: 0.85 mmol/L (ref 0.5–2.0)
LACTIC ACID, VENOUS: 0.32 mmol/L — AB (ref 0.5–2.0)

## 2014-08-09 LAB — CBC WITH DIFFERENTIAL/PLATELET
BASOS PCT: 1 % (ref 0–1)
Basophils Absolute: 0 10*3/uL (ref 0.0–0.1)
EOS PCT: 4 % (ref 0–5)
Eosinophils Absolute: 0.1 10*3/uL (ref 0.0–0.7)
HEMATOCRIT: 35.4 % — AB (ref 36.0–46.0)
Hemoglobin: 11.9 g/dL — ABNORMAL LOW (ref 12.0–15.0)
LYMPHS PCT: 42 % (ref 12–46)
Lymphs Abs: 1 10*3/uL (ref 0.7–4.0)
MCH: 29.2 pg (ref 26.0–34.0)
MCHC: 33.6 g/dL (ref 30.0–36.0)
MCV: 87 fL (ref 78.0–100.0)
Monocytes Absolute: 0.3 10*3/uL (ref 0.1–1.0)
Monocytes Relative: 15 % — ABNORMAL HIGH (ref 3–12)
Neutro Abs: 0.9 10*3/uL — ABNORMAL LOW (ref 1.7–7.7)
Neutrophils Relative %: 38 % — ABNORMAL LOW (ref 43–77)
Platelets: 244 10*3/uL (ref 150–400)
RBC: 4.07 MIL/uL (ref 3.87–5.11)
RDW: 13 % (ref 11.5–15.5)
WBC: 2.3 10*3/uL — ABNORMAL LOW (ref 4.0–10.5)

## 2014-08-09 LAB — TROPONIN I

## 2014-08-09 LAB — GLUCOSE, CAPILLARY
Glucose-Capillary: 204 mg/dL — ABNORMAL HIGH (ref 70–99)
Glucose-Capillary: 136 mg/dL — ABNORMAL HIGH (ref 70–99)

## 2014-08-09 LAB — URINALYSIS, ROUTINE W REFLEX MICROSCOPIC
BILIRUBIN URINE: NEGATIVE
Glucose, UA: NEGATIVE mg/dL
Hgb urine dipstick: NEGATIVE
Ketones, ur: NEGATIVE mg/dL
Leukocytes, UA: NEGATIVE
NITRITE: NEGATIVE
Protein, ur: NEGATIVE mg/dL
SPECIFIC GRAVITY, URINE: 1.006 (ref 1.005–1.030)
UROBILINOGEN UA: 0.2 mg/dL (ref 0.0–1.0)
pH: 7.5 (ref 5.0–8.0)

## 2014-08-09 LAB — LIPASE, BLOOD: Lipase: 49 U/L (ref 11–59)

## 2014-08-09 LAB — PROTIME-INR
INR: 1.01 (ref 0.00–1.49)
Prothrombin Time: 13.4 seconds (ref 11.6–15.2)

## 2014-08-09 LAB — TSH: TSH: 0.577 u[IU]/mL (ref 0.350–4.500)

## 2014-08-09 MED ORDER — ALUM & MAG HYDROXIDE-SIMETH 200-200-20 MG/5ML PO SUSP: 30.0000 mL | Freq: Four times a day (QID) | ORAL | Status: DC | PRN

## 2014-08-09 MED ORDER — AMLODIPINE BESYLATE 10 MG PO TABS
10.0000 mg | ORAL_TABLET | Freq: Every day | ORAL | Status: DC
  Administered 2014-08-10 – 2014-08-12 (×3): 10 mg via ORAL
  Filled 2014-08-09 (×3): qty 1

## 2014-08-09 MED ORDER — CITALOPRAM HYDROBROMIDE 20 MG PO TABS
20.0000 mg | ORAL_TABLET | Freq: Every day | ORAL | Status: DC
  Administered 2014-08-10 – 2014-08-12 (×3): 20 mg via ORAL
  Filled 2014-08-09 (×4): qty 1

## 2014-08-09 MED ORDER — MORPHINE SULFATE 2 MG/ML IJ SOLN
2.0000 mg | Freq: Once | INTRAMUSCULAR | Status: AC
  Administered 2014-08-09: 2 mg via INTRAVENOUS
  Filled 2014-08-09: qty 1

## 2014-08-09 MED ORDER — DOCUSATE SODIUM 100 MG PO CAPS
200.0000 mg | ORAL_CAPSULE | Freq: Two times a day (BID) | ORAL | Status: DC
  Administered 2014-08-09 – 2014-08-12 (×5): 200 mg via ORAL
  Filled 2014-08-09 (×8): qty 2

## 2014-08-09 MED ORDER — HYDRALAZINE HCL 25 MG PO TABS
25.0000 mg | ORAL_TABLET | Freq: Three times a day (TID) | ORAL | Status: DC
  Administered 2014-08-09: 25 mg via ORAL
  Filled 2014-08-09 (×3): qty 1

## 2014-08-09 MED ORDER — CALCIUM CARBONATE-VITAMIN D 500-200 MG-UNIT PO TABS
1.0000 | ORAL_TABLET | Freq: Every day | ORAL | Status: DC
  Administered 2014-08-10 – 2014-08-12 (×3): 1 via ORAL
  Filled 2014-08-09 (×3): qty 1

## 2014-08-09 MED ORDER — ASPIRIN 81 MG PO CHEW
81.0000 mg | CHEWABLE_TABLET | Freq: Every day | ORAL | Status: DC
  Administered 2014-08-10 – 2014-08-12 (×3): 81 mg via ORAL
  Filled 2014-08-09 (×3): qty 1

## 2014-08-09 MED ORDER — ONDANSETRON HCL 4 MG/2ML IJ SOLN
4.0000 mg | Freq: Once | INTRAMUSCULAR | Status: AC
  Administered 2014-08-09: 4 mg via INTRAVENOUS
  Filled 2014-08-09: qty 2

## 2014-08-09 MED ORDER — PANTOPRAZOLE SODIUM 40 MG PO TBEC
40.0000 mg | DELAYED_RELEASE_TABLET | Freq: Every day | ORAL | Status: DC
  Administered 2014-08-10 – 2014-08-12 (×3): 40 mg via ORAL
  Filled 2014-08-09 (×3): qty 1

## 2014-08-09 MED ORDER — ENSURE COMPLETE PO LIQD
237.0000 mL | Freq: Two times a day (BID) | ORAL | Status: DC
  Administered 2014-08-10 – 2014-08-12 (×5): 237 mL via ORAL

## 2014-08-09 MED ORDER — TAMSULOSIN HCL 0.4 MG PO CAPS
0.4000 mg | ORAL_CAPSULE | Freq: Every day | ORAL | Status: DC
  Administered 2014-08-09 – 2014-08-12 (×4): 0.4 mg via ORAL
  Filled 2014-08-09 (×4): qty 1

## 2014-08-09 MED ORDER — HYDROCODONE-ACETAMINOPHEN 5-325 MG PO TABS
1.0000 | ORAL_TABLET | ORAL | Status: DC | PRN
  Administered 2014-08-09 – 2014-08-10 (×2): 1 via ORAL
  Filled 2014-08-09 (×3): qty 1

## 2014-08-09 MED ORDER — IOHEXOL 300 MG/ML  SOLN
100.0000 mL | Freq: Once | INTRAMUSCULAR | Status: AC | PRN
  Administered 2014-08-09: 100 mL via INTRAVENOUS

## 2014-08-09 MED ORDER — MIRABEGRON ER 50 MG PO TB24
50.0000 mg | ORAL_TABLET | Freq: Every day | ORAL | Status: DC
  Administered 2014-08-10 – 2014-08-12 (×2): 50 mg via ORAL
  Filled 2014-08-09 (×4): qty 1

## 2014-08-09 MED ORDER — HYDRALAZINE HCL 50 MG PO TABS
75.0000 mg | ORAL_TABLET | Freq: Three times a day (TID) | ORAL | Status: DC
  Administered 2014-08-10 – 2014-08-12 (×6): 75 mg via ORAL
  Filled 2014-08-09 (×10): qty 1

## 2014-08-09 MED ORDER — SODIUM CHLORIDE 0.9 % IV SOLN
INTRAVENOUS | Status: DC
  Administered 2014-08-09: 23:00:00 via INTRAVENOUS
  Administered 2014-08-09: 75 mL/h via INTRAVENOUS
  Administered 2014-08-10: 1000 mL via INTRAVENOUS

## 2014-08-09 MED ORDER — ROSUVASTATIN CALCIUM 5 MG PO TABS
5.0000 mg | ORAL_TABLET | Freq: Every day | ORAL | Status: DC
  Administered 2014-08-09 – 2014-08-11 (×3): 5 mg via ORAL
  Filled 2014-08-09 (×4): qty 1

## 2014-08-09 MED ORDER — IOHEXOL 300 MG/ML  SOLN
25.0000 mL | Freq: Once | INTRAMUSCULAR | Status: AC | PRN
  Administered 2014-08-09: 25 mL via ORAL

## 2014-08-09 MED ORDER — ONDANSETRON HCL 4 MG PO TABS
4.0000 mg | ORAL_TABLET | Freq: Four times a day (QID) | ORAL | Status: DC | PRN
  Filled 2014-08-09: qty 1

## 2014-08-09 MED ORDER — ISOSORBIDE MONONITRATE ER 60 MG PO TB24
60.0000 mg | ORAL_TABLET | Freq: Every day | ORAL | Status: DC
  Administered 2014-08-10 – 2014-08-12 (×3): 60 mg via ORAL
  Filled 2014-08-09 (×4): qty 1

## 2014-08-09 MED ORDER — SODIUM CHLORIDE 0.9 % IJ SOLN: 3.0000 mL | INTRAMUSCULAR | Status: DC | PRN

## 2014-08-09 MED ORDER — BISACODYL 10 MG RE SUPP
10.0000 mg | Freq: Every day | RECTAL | Status: DC
  Administered 2014-08-09 – 2014-08-12 (×4): 10 mg via RECTAL
  Filled 2014-08-09 (×4): qty 1

## 2014-08-09 MED ORDER — INSULIN ASPART 100 UNIT/ML ~~LOC~~ SOLN: 0.0000 [IU] | Freq: Every day | SUBCUTANEOUS | Status: DC

## 2014-08-09 MED ORDER — GI COCKTAIL ~~LOC~~
30.0000 mL | Freq: Three times a day (TID) | ORAL | Status: DC | PRN
  Administered 2014-08-09: 30 mL via ORAL
  Filled 2014-08-09 (×2): qty 30

## 2014-08-09 MED ORDER — LUBIPROSTONE 24 MCG PO CAPS
24.0000 ug | ORAL_CAPSULE | Freq: Two times a day (BID) | ORAL | Status: DC
  Administered 2014-08-10 – 2014-08-12 (×4): 24 ug via ORAL
  Filled 2014-08-09 (×7): qty 1

## 2014-08-09 MED ORDER — NITROGLYCERIN 0.4 MG/SPRAY TL SOLN: 2.0000 | Status: DC | PRN

## 2014-08-09 MED ORDER — HEPARIN SODIUM (PORCINE) 5000 UNIT/ML IJ SOLN
5000.0000 [IU] | Freq: Three times a day (TID) | INTRAMUSCULAR | Status: DC
  Administered 2014-08-09 – 2014-08-12 (×8): 5000 [IU] via SUBCUTANEOUS
  Filled 2014-08-09 (×11): qty 1

## 2014-08-09 MED ORDER — FLUTICASONE PROPIONATE 50 MCG/ACT NA SUSP
2.0000 | Freq: Every day | NASAL | Status: DC
  Administered 2014-08-10 – 2014-08-12 (×3): 2 via NASAL
  Filled 2014-08-09: qty 16

## 2014-08-09 MED ORDER — SODIUM CHLORIDE 0.9 % IJ SOLN
3.0000 mL | Freq: Two times a day (BID) | INTRAMUSCULAR | Status: DC
  Administered 2014-08-10: 3 mL via INTRAVENOUS

## 2014-08-09 MED ORDER — LORATADINE 10 MG PO TABS
10.0000 mg | ORAL_TABLET | Freq: Every day | ORAL | Status: DC
Start: 2014-08-10 — End: 2014-08-12
  Administered 2014-08-10 – 2014-08-12 (×3): 10 mg via ORAL
  Filled 2014-08-09 (×3): qty 1

## 2014-08-09 MED ORDER — HYDRALAZINE HCL 50 MG PO TABS
50.0000 mg | ORAL_TABLET | ORAL | Status: AC
  Administered 2014-08-09: 50 mg via ORAL
  Filled 2014-08-09: qty 1

## 2014-08-09 MED ORDER — INSULIN ASPART 100 UNIT/ML ~~LOC~~ SOLN
0.0000 [IU] | Freq: Three times a day (TID) | SUBCUTANEOUS | Status: DC
  Administered 2014-08-11: 1 [IU] via SUBCUTANEOUS
  Administered 2014-08-11: 2 [IU] via SUBCUTANEOUS

## 2014-08-09 MED ORDER — SODIUM CHLORIDE 0.9 % IV SOLN: INTRAVENOUS | Status: DC

## 2014-08-09 MED ORDER — GUAIFENESIN-DM 100-10 MG/5ML PO SYRP
5.0000 mL | ORAL_SOLUTION | ORAL | Status: DC | PRN
  Filled 2014-08-09: qty 5

## 2014-08-09 MED ORDER — POLYETHYLENE GLYCOL 3350 17 G PO PACK
17.0000 g | PACK | Freq: Two times a day (BID) | ORAL | Status: DC
  Administered 2014-08-09 – 2014-08-12 (×5): 17 g via ORAL
  Filled 2014-08-09 (×8): qty 1

## 2014-08-09 MED ORDER — ONDANSETRON HCL 4 MG/2ML IJ SOLN
4.0000 mg | Freq: Four times a day (QID) | INTRAMUSCULAR | Status: DC | PRN
  Administered 2014-08-11: 4 mg via INTRAVENOUS
  Filled 2014-08-09: qty 2

## 2014-08-09 MED ORDER — KETOROLAC TROMETHAMINE 0.5 % OP SOLN
1.0000 [drp] | Freq: Every day | OPHTHALMIC | Status: DC
  Administered 2014-08-10 – 2014-08-12 (×3): 1 [drp] via OPHTHALMIC
  Filled 2014-08-09: qty 3

## 2014-08-09 MED ORDER — SODIUM CHLORIDE 0.9 % IV SOLN: 250.0000 mL | INTRAVENOUS | Status: DC | PRN

## 2014-08-09 NOTE — Progress Notes (Signed)
NURSING PROGRESS NOTE  Lanah Steines [id number] Admission Data: 08/09/2014 6:43 PM Attending Provider: Thurnell Lose, MD SHF:WYOVZ, Mallie Mussel, MD Code Status: FULL   [name] is a 81 y.o. female patient admitted from ED:  -No acute distress noted.  -No complaints of shortness of breath.  -No complaints of chest pain.     Blood pressure 191/64, pulse 64, temperature 98.5 F (36.9 C), temperature source Oral, resp. rate 16, height 5\' 3"  (1.6 m), weight 71.4 kg (157 lb 6.5 oz), SpO2 97 %.   IV Fluids:  IV in place, occlusive dsg intact without redness, IV cath forearm right, condition patent and no redness   Allergies:  Chicken allergy; Fish allergy; and Percocet  Past Medical History:   has a past medical history of Coronary artery disease; Dementia; Hypertension; Hyperlipidemia; Anxiety; GERD (gastroesophageal reflux disease); CHF (congestive heart failure); Polyp, stomach (11/11/2006); Aphakia of left eye; Obese; MI (myocardial infarction); Chest pain at rest (12/20/2011); CAD (coronary artery disease), cutting balloon athrectomy of her dominant AV groove of LCX.  2007 (12/20/2011); Bradycardia (12/20/2011); HTN (hypertension) (12/20/2011); Dyslipidemia  (12/20/2011); Cardiomyopathy; History of GI bleed; Atrial fibrillation; Sigmoid diverticulosis; Anemia, iron deficiency (10/24/2012); Constipation due to pain medication (10/24/2012); Cataract; Chronic bronchitis; Shortness of breath; OSA on CPAP (12/20/2011); Type II diabetes mellitus; DJD (degenerative joint disease); Osteoarthritis; Arthritis; Chronic back pain; Nephrolithiasis; and Cancer.  Past Surgical History:   has past surgical history that includes Back surgery; Appendectomy; Fracture surgery; Dilation and curettage of uterus; Coronary angioplasty; Esophagogastroduodenoscopy; Colonoscopy; EUS; Esophagogastroduodenoscopy (02/21/2012); Balloon dilation (02/21/2012); Colonoscopy (N/A, 10/24/2012); Coronary angioplasty (03-07-2004); Cardiac  catheterization (06/05/2004); Cardiac catheterization (04/19/2005); US echocardiography (01/25/2008); Lexiscan Mycardial Perfusion Scan (01/25/2008); Inguinal hernia repair (Right, 06/18/2013); Tonsillectomy; Total abdominal hysterectomy; Cataract extraction w/ intraocular lens  implant, bilateral (Bilateral); Breast biopsy (Right); Breast lumpectomy (Right); Inguinal hernia repair (Right, 06/18/2013); Insertion of mesh (Right, 06/18/2013); and left heart catheterization with coronary angiogram (N/A, 12/21/2011).  Skin: intact  Patient orientated to room. Information packet given to patient. Admission inpatient armband information verified with patient to include name and date of birth and placed on patient arm. Side rails up x 2, fall assessment and education completed with patient. Patient able to verbalize understanding of risk associated with falls and verbalized understanding to call for assistance before getting out of bed. Call light within reach. Patient able to voice and demonstrate understanding of unit orientation instructions.    Will continue to evaluate and treat per MD orders.  Wallie Renshaw, RN

## 2014-08-09 NOTE — ED Notes (Signed)
Admitting MD at bedside.

## 2014-08-09 NOTE — ED Provider Notes (Signed)
CSN: [id number]     Arrival date & time 08/09/14  1540 History  This chart was scribed for Ezequiel Essex, MD by Ludger Nutting, ED Scribe. This patient was seen in room B15C/B15C and the patient's care was started 9:43 AM.    Chief Complaint  Patient presents with  . Abdominal Pain  . Weakness    The history is provided by the patient. No language interpreter was used.     LEVEL 5 CAVEAT - DEMENTIA  HPI Comments: [name] is a 82 y.o. female who presents to the Emergency Department from [facility] complaining of constant abdominal pain with 2 episodes of vomiting today. She also complains of dysuria, pain with BM's, and rectal pain. She reports her last BM was 3 weeks ago but staff reports she had one today. She normally takes Miralax for her constipation. She reports noticing "lumps of blood" in her stools. She complains of subjective fever and occasional chest pains. She denies diarrhea, nausea.   Past Medical History  Diagnosis Date  . Coronary artery disease   . Dementia   . Hypertension   . Hyperlipidemia   . Anxiety   . GERD (gastroesophageal reflux disease)   . CHF (congestive heart failure)   . Polyp, stomach 11/11/2006  . Aphakia of left eye   . Obese   . MI (myocardial infarction)   . Chest pain at rest 12/20/2011  . CAD (coronary artery disease), cutting balloon athrectomy of her dominant AV groove of LCX.  2007 12/20/2011  . Bradycardia 12/20/2011  . HTN (hypertension) 12/20/2011  . Dyslipidemia  12/20/2011  . Cardiomyopathy   . History of GI bleed   . Atrial fibrillation   . Sigmoid diverticulosis   . Anemia, iron deficiency 10/24/2012  . Constipation due to pain medication 10/24/2012  . Cataract     "coming back in both eyes" (06/19/2013)  . Chronic bronchitis     "get it q yr" (06/19/2013)  . Shortness of breath     "comes on at anytime" (06/19/2013)  . OSA on CPAP 12/20/2011  . Type II diabetes mellitus   . DJD (degenerative joint disease)   .  Osteoarthritis   . Arthritis     "all over my body" (06/19/2013)  . Chronic back pain     "all over my back" (06/19/2013)  . Nephrolithiasis     bilateral  . Cancer     breast cancer    Past Surgical History  Procedure Laterality Date  . Back surgery    . Appendectomy    . Fracture surgery    . Dilation and curettage of uterus    . Coronary angioplasty    . Esophagogastroduodenoscopy    . Colonoscopy    . Eus    . Esophagogastroduodenoscopy  02/21/2012    Procedure: ESOPHAGOGASTRODUODENOSCOPY (EGD);  Surgeon: Gatha Mayer, MD;  Location: [facility];  Service: Endoscopy;  Laterality: N/A;  pt. being evaluated for a pacemaker  . Balloon dilation  02/21/2012    Procedure: BALLOON DILATION;  Surgeon: Gatha Mayer, MD;  Location: [facility];  Service: Endoscopy;  Laterality: N/A;  . Colonoscopy N/A 10/24/2012    Procedure: COLONOSCOPY;  Surgeon: Gatha Mayer, MD;  Location: [facility];  Service: Endoscopy;  Laterality: N/A;  . Coronary angioplasty  03-07-2004    cutting balloon  . Cardiac catheterization  06/05/2004    high grade disease AV groove CX  . Cardiac catheterization  04/19/2005    noncritical  CAD  . US echocardiography  01/25/2008    mild DUST,mild MR,mod. ca+ mitral annular,AOV mildly sclerotid  . Lexiscan mycardial perfusion scan  01/25/2008    Normal  . Inguinal hernia repair Right 06/18/2013  . Tonsillectomy    . Total abdominal hysterectomy    . Cataract extraction w/ intraocular lens  implant, bilateral Bilateral   . Breast biopsy Right   . Breast lumpectomy Right   . Inguinal hernia repair Right 06/18/2013    Procedure: HERNIA REPAIR INGUINAL ADULT;  Surgeon: Gwenyth Ober, MD;  Location: [facility];  Service: General;  Laterality: Right;  . Insertion of mesh Right 06/18/2013    Procedure: INSERTION OF MESH;  Surgeon: Gwenyth Ober, MD;  Location: [facility];  Service: General;  Laterality: Right;  . Left heart catheterization with coronary angiogram N/A  12/21/2011    Procedure: LEFT HEART CATHETERIZATION WITH CORONARY ANGIOGRAM;  Surgeon: Lorretta Harp, MD;  Location: [facility];  Service: Cardiovascular;  Laterality: N/A;   Family History  Problem Relation Age of Onset  . Colon cancer Mother   . Heart disease Mother   . Stroke Mother    History  Substance Use Topics  . Smoking status: Never Smoker   . Smokeless tobacco: Never Used  . Alcohol Use: No   OB History    No data available     Review of Systems  Unable to perform ROS: Dementia      Allergies  Chicken allergy; Fish allergy; and Percocet  Home Medications   Prior to Admission medications   Medication Sig Start Date End Date Taking? Authorizing Provider  acetaminophen (TYLENOL) 500 MG tablet Take 500 mg by mouth 3 (three) times daily.   Yes Historical Provider, MD  amLODipine (NORVASC) 10 MG tablet Take 10 mg by mouth daily.   Yes Historical Provider, MD  aspirin 81 MG chewable tablet Chew 81 mg by mouth daily.   Yes Historical Provider, MD  calcium-vitamin D (OSCAL WITH D) 500-200 MG-UNIT per tablet Take 1 tablet by mouth daily.   Yes Historical Provider, MD  cetirizine (ZYRTEC) 5 MG tablet Take 5 mg by mouth daily.   Yes Historical Provider, MD  cholecalciferol (VITAMIN D) 400 UNITS TABS tablet Take 800 Units by mouth daily.   Yes Historical Provider, MD  citalopram (CELEXA) 20 MG tablet Take 20 mg by mouth daily before breakfast.    Yes Historical Provider, MD  docusate sodium (COLACE) 100 MG capsule Take 100 mg by mouth 2 (two) times daily.   Yes Historical Provider, MD  esomeprazole (NEXIUM) 40 MG capsule Take 40 mg by mouth daily before breakfast.   Yes Historical Provider, MD  fluticasone (FLONASE) 50 MCG/ACT nasal spray Place 2 sprays into both nostrils daily.    Yes Historical Provider, MD  hydrALAZINE (APRESOLINE) 25 MG tablet Take 25 mg by mouth 3 (three) times daily.   Yes Historical Provider, MD  isosorbide mononitrate (IMDUR) 60 MG 24 hr tablet Take  1 tablet (60 mg total) by mouth daily before breakfast. 11/16/12  Yes Isaiah Serge, NP  ketorolac (ACULAR) 0.5 % ophthalmic solution Place 1 drop into both eyes daily.   Yes Historical Provider, MD  losartan-hydrochlorothiazide (HYZAAR) 100-25 MG per tablet Take 1 tablet by mouth daily before breakfast.    Yes Historical Provider, MD  lubiprostone (AMITIZA) 24 MCG capsule Take 1 capsule (24 mcg total) by mouth 2 (two) times daily with a meal. 10/24/12  Yes Gatha Mayer, MD  metoCLOPramide (REGLAN) 5 MG tablet Take 5 mg by mouth 4 (four) times daily -  before meals and at bedtime.   Yes Historical Provider, MD  mirabegron ER (MYRBETRIQ) 50 MG TB24 tablet Take 50 mg by mouth daily.   Yes Historical Provider, MD  Olopatadine HCl (PATADAY) 0.2 % SOLN Place 1 drop into both eyes daily.    Yes Historical Provider, MD  polyethylene glycol (MIRALAX / GLYCOLAX) packet Take 17 g by mouth daily. Mix in 8 oz of suitable liquid and drink by mouth every day   Yes Historical Provider, MD  rosuvastatin (CRESTOR) 5 MG tablet Take 5 mg by mouth at bedtime.    Yes Historical Provider, MD  senna (SENOKOT) 8.6 MG TABS Take 1 tablet by mouth 2 (two) times daily.   Yes Historical Provider, MD  spironolactone (ALDACTONE) 25 MG tablet Take 25 mg by mouth daily before breakfast.    Yes Historical Provider, MD  cephALEXin (KEFLEX) 500 MG capsule Take 1 capsule (500 mg total) by mouth 4 (four) times daily. Patient not taking: Reported on 08/09/2014 04/23/14   Larene Pickett, PA-C  nitroGLYCERIN (NITROLINGUAL) 0.4 MG/SPRAY spray Place 2 sprays under the tongue every 5 (five) minutes as needed. Use 2 sprays under tongue as needed for chest pain. Repeat 2 sprays if pain persists after 15 minutes if pain still persists    Historical Provider, MD   BP 173/65 mmHg  Pulse 64  Temp(Src) 98.6 F (37 C) (Oral)  Resp 16  Ht 5\' 5"  (1.651 m)  SpO2 97% Physical Exam  Constitutional: She is oriented to person, place, and time. She  appears well-developed and well-nourished. No distress.  HENT:  Head: Normocephalic and atraumatic.  Mouth/Throat: Oropharynx is clear and moist. No oropharyngeal exudate.  Eyes: Conjunctivae and EOM are normal. Pupils are equal, round, and reactive to light.  Neck: Normal range of motion. Neck supple.  No meningismus.  Cardiovascular: Normal rate, regular rhythm, normal heart sounds and intact distal pulses.   No murmur heard. Pulmonary/Chest: Effort normal and breath sounds normal. No respiratory distress.  Abdominal: Soft. There is tenderness (lower abdominal tenderness ). There is no rebound and no guarding.  Lower abdominal tenderness with distended palpable bladder   Genitourinary:  Chaperoned Rectal Exam:  No external hemorrhoids. No fecal impaction. Brown stool present  Musculoskeletal: Normal range of motion. She exhibits no edema or tenderness.  Neurological: She is alert and oriented to person, place, and time. No cranial nerve deficit. She exhibits normal muscle tone. Coordination normal.  No ataxia on finger to nose bilaterally. No pronator drift. 5/5 strength throughout. CN 2-12 intact. Equal grip strength. Sensation intact.   Skin: Skin is warm.  Psychiatric: She has a normal mood and affect. Her behavior is normal.  Nursing note and vitals reviewed.   ED Course  Procedures (including critical care time)  DIAGNOSTIC STUDIES: Oxygen Saturation is 94% on RA, adequate by my interpretation.    COORDINATION OF CARE: 10:04 AM Discussed treatment plan with pt at bedside and pt agreed to plan.   Labs Review Labs Reviewed  CBC WITH DIFFERENTIAL/PLATELET - Abnormal; Notable for the following:    WBC 2.3 (*)    Hemoglobin 11.9 (*)    HCT 35.4 (*)    Neutrophils Relative % 38 (*)    Monocytes Relative 15 (*)    Neutro Abs 0.9 (*)    All other components within normal limits  COMPREHENSIVE METABOLIC PANEL - Abnormal; Notable for the following:  Sodium 131 (*)     Glucose, Bld 105 (*)    GFR calc non Af Amer 75 (*)    GFR calc Af Amer 87 (*)    All other components within normal limits  I-STAT CG4 LACTIC ACID, ED - Abnormal; Notable for the following:    Lactic Acid, Venous 0.32 (*)    All other components within normal limits  LIPASE, BLOOD  TROPONIN I  URINALYSIS, ROUTINE W REFLEX MICROSCOPIC  PROTIME-INR  TSH  POC OCCULT BLOOD, ED  I-STAT CG4 LACTIC ACID, ED    Imaging Review Dg Knee 2 Views Left  08/09/2014   CLINICAL DATA:  Abdominal pain and weakness. Pain in the left hip and knee. No known injury  EXAM: LEFT KNEE - 1-2 VIEW  COMPARISON:  11/28/2005  FINDINGS: There is no evidence of fracture, dislocation, or joint effusion.  Medial compartment osteoarthritis with moderate marginal spurring. There is degenerative marginal spurring at the patellofemoral compartment, with joint narrowing, that has progressed from 2007.  Osteopenia.  IMPRESSION: 1. No acute osseous findings. 2. Knee osteoarthritis with mild progression from 2007.   Electronically Signed   By: Monte Fantasia M.D.   On: 08/09/2014 13:15   Ct Abdomen Pelvis W Contrast  08/09/2014   CLINICAL DATA:  Abdominal pain and urinary frequency  EXAM: CT ABDOMEN AND PELVIS WITH CONTRAST  TECHNIQUE: Multidetector CT imaging of the abdomen and pelvis was performed using the standard protocol following bolus administration of intravenous contrast.  CONTRAST:  165mL OMNIPAQUE IOHEXOL 300 MG/ML  SOLN  COMPARISON:  02/28/2013  FINDINGS: Lung bases are free of acute infiltrate or sizable effusion.  The liver, gallbladder, spleen, adrenal glands and pancreas are within normal limits. The kidneys are well visualized and show no evidence of renal calculi or obstructive changes. Renal vascular calcifications are again noted. No obstructive changes are seen.  The bladder is well distended with evidence of a Foley catheter within. Air is noted within the bladder consistent with the recent catheter placement.  The appendix is not visualized consistent with a prior surgical history. No pelvic mass lesion is noted. The uterus is been surgically removed. Diffuse diverticular change of the colon is noted. No diverticulitis is seen. Aortoiliac calcifications are noted without aneurysmal dilatation. The osseous structures show show degenerative changes of the hip joints and lumbar spine. No acute bony abnormality is seen.  IMPRESSION: Diverticulosis without diverticulitis.  Status post appendectomy and hysterectomy  No acute abnormality is noted.   Electronically Signed   By: Inez Catalina M.D.   On: 08/09/2014 14:13   Dg Abd Acute W/chest  08/09/2014   CLINICAL DATA:  Nausea and vomiting with abdominal pain  EXAM: ACUTE ABDOMEN SERIES (ABDOMEN 2 VIEW & CHEST 1 VIEW)  COMPARISON:  04/22/2014  FINDINGS: Cardiac shadow is mildly enlarged but stable. The lungs are clear bilaterally. No focal infiltrate is noted. The abdomen show scattered large and small bowel gas. No obstructive changes are. No free air is seen. No acute bony abnormality is noted.  IMPRESSION: No acute abnormality noted.   Electronically Signed   By: Inez Catalina M.D.   On: 08/09/2014 11:48   Dg Hip Unilat With Pelvis 2-3 Views Left  08/09/2014   CLINICAL DATA:  Pain all over the left hip and left knee. No known injury.  EXAM: LEFT HIP (WITH PELVIS) 2-3 VIEWS  COMPARISON:  10/28/2013  FINDINGS: There appears to be oral contrast within loops of bowel. The pelvic bony ring is  intact. Medial joint space narrowing in both hips and similar to the prior examination. Mild cortical irregularity involving the femoral necks bilaterally and unchanged. Left hip is located without a fracture.  IMPRESSION: No acute bone abnormality in the pelvis or left hip.  Osteoarthritic changes in both hips.   Electronically Signed   By: Markus Daft M.D.   On: 08/09/2014 13:16     EKG Interpretation   Date/Time:  Friday August 09 2014 09:47:44 EST Ventricular Rate:  72 PR  Interval:  169 QRS Duration: 96 QT Interval:  398 QTC Calculation: 435 R Axis:   -56 Text Interpretation:  Sinus rhythm Abnormal R-wave progression, early  transition Inferior infarct, old Baseline wander in lead(s) V2 No  significant change was found Confirmed by Wyvonnia Dusky  MD, Krystian Ferrentino [phone]) on  08/09/2014 10:20:35 AM      MDM   Final diagnoses:  Pain  Urinary retention  Generalized weakness  Abdominal pain, unspecified abdominal location   patient from nursing home with lower abdominal pain, difficulty with urination, constipation over the past several weeks. States no bowel movement for 3 weeks. No vomiting. No fever. Complains of pain with urination.  Distended palpable bladder on arrival. Greater than 500 mL on bladder scan. Foley catheter replaced. No fecal impaction.  Lactate normal. UA negative. 900 mL obtained upon catheterization. X-ray shows no bowel obstruction. Labs reasurring . Chronic anemia and leukopenia. CT abdomen negative for acute pathology.  Patient unable to stand or ambulate without assistance.  Normally uses walker. States she feels to weak. Observation admission dw Dr. Candiss Norse.   I personally performed the services described in this documentation, which was scribed in my presence. The recorded information has been reviewed and is accurate.   Ezequiel Essex, MD 08/09/14 [phone]

## 2014-08-09 NOTE — ED Notes (Signed)
Called Pharmacy about patient's Hydralazine.

## 2014-08-09 NOTE — ED Notes (Signed)
Pt in from [facility] via [facility] per report pt c/o abd pain, last staff reported BM was today, pt denies BM in 3 wks, pt c/o hemorrhoid pain, pt denies n/v/d, c/o urinary frequency & dysuria, pt seen by PA at facility on Tues, A&O x4, follows commands, speaks in complete sentences

## 2014-08-09 NOTE — H&P (Signed)
Patient Demographics  [name], is a 81 y.o. female  MRN: [id number]   DOB - 22-Jul-1926  Admit Date - 08/09/2014  Outpatient Primary MD for the patient is Reymundo Poll, MD   With History of -  Past Medical History  Diagnosis Date  . Coronary artery disease   . Dementia   . Hypertension   . Hyperlipidemia   . Anxiety   . GERD (gastroesophageal reflux disease)   . CHF (congestive heart failure)   . Polyp, stomach 11/11/2006  . Aphakia of left eye   . Obese   . MI (myocardial infarction)   . Chest pain at rest 12/20/2011  . CAD (coronary artery disease), cutting balloon athrectomy of her dominant AV groove of LCX.  2007 12/20/2011  . Bradycardia 12/20/2011  . HTN (hypertension) 12/20/2011  . Dyslipidemia  12/20/2011  . Cardiomyopathy   . History of GI bleed   . Atrial fibrillation   . Sigmoid diverticulosis   . Anemia, iron deficiency 10/24/2012  . Constipation due to pain medication 10/24/2012  . Cataract     "coming back in both eyes" (06/19/2013)  . Chronic bronchitis     "get it q yr" (06/19/2013)  . Shortness of breath     "comes on at anytime" (06/19/2013)  . OSA on CPAP 12/20/2011  . Type II diabetes mellitus   . DJD (degenerative joint disease)   . Osteoarthritis   . Arthritis     "all over my body" (06/19/2013)  . Chronic back pain     "all over my back" (06/19/2013)  . Nephrolithiasis     bilateral  . Cancer     breast cancer       Past Surgical History  Procedure Laterality Date  . Back surgery    . Appendectomy    . Fracture surgery    . Dilation and curettage of uterus    . Coronary angioplasty    . Esophagogastroduodenoscopy    . Colonoscopy    . Eus    . Esophagogastroduodenoscopy  02/21/2012    Procedure: ESOPHAGOGASTRODUODENOSCOPY (EGD);  Surgeon: Gatha Mayer, MD;   Location: [facility];  Service: Endoscopy;  Laterality: N/A;  pt. being evaluated for a pacemaker  . Balloon dilation  02/21/2012    Procedure: BALLOON DILATION;  Surgeon: Gatha Mayer, MD;  Location: [facility];  Service: Endoscopy;  Laterality: N/A;  . Colonoscopy N/A 10/24/2012    Procedure: COLONOSCOPY;  Surgeon: Gatha Mayer, MD;  Location: [facility];  Service: Endoscopy;  Laterality: N/A;  . Coronary angioplasty  03-07-2004    cutting balloon  . Cardiac catheterization  06/05/2004    high grade disease AV groove CX  . Cardiac catheterization  04/19/2005    noncritical CAD  . US echocardiography  01/25/2008    mild DUST,mild MR,mod. ca+ mitral annular,AOV mildly sclerotid  . Lexiscan mycardial perfusion scan  01/25/2008    Normal  .  Inguinal hernia repair Right 06/18/2013  . Tonsillectomy    . Total abdominal hysterectomy    . Cataract extraction w/ intraocular lens  implant, bilateral Bilateral   . Breast biopsy Right   . Breast lumpectomy Right   . Inguinal hernia repair Right 06/18/2013    Procedure: HERNIA REPAIR INGUINAL ADULT;  Surgeon: Gwenyth Ober, MD;  Location: [place];  Service: General;  Laterality: Right;  . Insertion of mesh Right 06/18/2013    Procedure: INSERTION OF MESH;  Surgeon: Gwenyth Ober, MD;  Location: [place];  Service: General;  Laterality: Right;  . Left heart catheterization with coronary angiogram N/A 12/21/2011    Procedure: LEFT HEART CATHETERIZATION WITH CORONARY ANGIOGRAM;  Surgeon: Lorretta Harp, MD;  Location: [facility] C[facility];  Service: Cardiovascular;  Laterality: N/A;    in for   Chief Complaint  Patient presents with  . Abdominal Pain  . Weakness     HPI  [name]  is a 81 y.o. female, with history of mild dementia and lives in an assisted living, CAD, hypertension, dyslipidemia, GERD, sleep apnea on C Pap night, type 2 diabetes mellitus, history of paroxysmal atrial fibrillation in the chart patient does not recall [place] Vasc 2  is 6, she is not on anticoagulation due to advanced age and risk of fall and bleeding, who comes to the hospital with 1 week history of severe constipation, she also developed inability to pass urine for the last day or so with some abdominal pain and discomfort along with nausea vomiting this morning, she had a bowel movement yesterday after a long time and it was hard, she developed some gradual generalized weakness.  In the ER workup consistent with urinary retention with over 1 L of fluid in the bladder, Foley catheter was placed, lab work was unremarkable except for some mild leukopenia which appears to be chronic in nature, she had some generalized weakness and deconditioning, she was unable to get up and ambulate by herself and I was called to admit the patient.    Review of Systems currently except generalized weakness she feels much better   In addition to the HPI above,   No Fever-chills, No Headache, No changes with Vision or hearing, No problems swallowing food or Liquids, No Chest pain, Cough or Shortness of Breath, No Abdominal pain, No Nausea or Vommitting, Bowel movements are regular, No Blood in stool or Urine, No dysuria, No new skin rashes or bruises, No new joints pains-aches,  No new weakness, tingling, numbness in any extremity, but generalized weakness. No recent weight gain or loss, No polyuria, polydypsia or polyphagia, No significant Mental Stressors.  A full 10 point Review of Systems was done, except as stated above, all other Review of Systems were negative.   Social History History  Substance Use Topics  . Smoking status: Never Smoker   . Smokeless tobacco: Never Used  . Alcohol Use: No      Family History Family History  Problem Relation Age of Onset  . Colon cancer Mother   . Heart disease Mother   . Stroke Mother       Prior to Admission medications   Medication Sig Start Date End Date Taking? Authorizing Provider  acetaminophen (TYLENOL)  500 MG tablet Take 500 mg by mouth 3 (three) times daily.   Yes Historical Provider, MD  amLODipine (NORVASC) 10 MG tablet Take 10 mg by mouth daily.   Yes Historical Provider, MD  aspirin 81 MG chewable  tablet Chew 81 mg by mouth daily.   Yes Historical Provider, MD  calcium-vitamin D (OSCAL WITH D) 500-200 MG-UNIT per tablet Take 1 tablet by mouth daily.   Yes Historical Provider, MD  cetirizine (ZYRTEC) 5 MG tablet Take 5 mg by mouth daily.   Yes Historical Provider, MD  cholecalciferol (VITAMIN D) 400 UNITS TABS tablet Take 800 Units by mouth daily.   Yes Historical Provider, MD  citalopram (CELEXA) 20 MG tablet Take 20 mg by mouth daily before breakfast.    Yes Historical Provider, MD  docusate sodium (COLACE) 100 MG capsule Take 100 mg by mouth 2 (two) times daily.   Yes Historical Provider, MD  esomeprazole (NEXIUM) 40 MG capsule Take 40 mg by mouth daily before breakfast.   Yes Historical Provider, MD  fluticasone (FLONASE) 50 MCG/ACT nasal spray Place 2 sprays into both nostrils daily.    Yes Historical Provider, MD  hydrALAZINE (APRESOLINE) 25 MG tablet Take 25 mg by mouth 3 (three) times daily.   Yes Historical Provider, MD  isosorbide mononitrate (IMDUR) 60 MG 24 hr tablet Take 1 tablet (60 mg total) by mouth daily before breakfast. 11/16/12  Yes Isaiah Serge, NP  ketorolac (ACULAR) 0.5 % ophthalmic solution Place 1 drop into both eyes daily.   Yes Historical Provider, MD  losartan-hydrochlorothiazide (HYZAAR) 100-25 MG per tablet Take 1 tablet by mouth daily before breakfast.    Yes Historical Provider, MD  lubiprostone (AMITIZA) 24 MCG capsule Take 1 capsule (24 mcg total) by mouth 2 (two) times daily with a meal. 10/24/12  Yes Gatha Mayer, MD  metoCLOPramide (REGLAN) 5 MG tablet Take 5 mg by mouth 4 (four) times daily -  before meals and at bedtime.   Yes Historical Provider, MD  mirabegron ER (MYRBETRIQ) 50 MG TB24 tablet Take 50 mg by mouth daily.   Yes Historical Provider, MD    Olopatadine HCl (PATADAY) 0.2 % SOLN Place 1 drop into both eyes daily.    Yes Historical Provider, MD  polyethylene glycol (MIRALAX / GLYCOLAX) packet Take 17 g by mouth daily. Mix in 8 oz of suitable liquid and drink by mouth every day   Yes Historical Provider, MD  rosuvastatin (CRESTOR) 5 MG tablet Take 5 mg by mouth at bedtime.    Yes Historical Provider, MD  senna (SENOKOT) 8.6 MG TABS Take 1 tablet by mouth 2 (two) times daily.   Yes Historical Provider, MD  spironolactone (ALDACTONE) 25 MG tablet Take 25 mg by mouth daily before breakfast.    Yes Historical Provider, MD  cephALEXin (KEFLEX) 500 MG capsule Take 1 capsule (500 mg total) by mouth 4 (four) times daily. Patient not taking: Reported on 08/09/2014 04/23/14   Larene Pickett, PA-C  nitroGLYCERIN (NITROLINGUAL) 0.4 MG/SPRAY spray Place 2 sprays under the tongue every 5 (five) minutes as needed. Use 2 sprays under tongue as needed for chest pain. Repeat 2 sprays if pain persists after 15 minutes if pain still persists    Historical Provider, MD    Allergies  Allergen Reactions  . Chicken Allergy     unknown  . Fish Allergy     unknown  . Percocet [Oxycodone-Acetaminophen] Itching    Physical Exam  Vitals  Blood pressure 179/62, pulse 69, temperature 98.6 F (37 C), temperature source Oral, resp. rate 20, height 5\' 5"  (1.651 m), SpO2 98 %.   1. General elderly AA female  lying in bed in NAD,    2. Normal affect and  insight, Not Suicidal or Homicidal, Awake Alert, Oriented X 3.  3. No F.N deficits, ALL C.Nerves Intact, Strength 5/5 all 4 extremities, Sensation intact all 4 extremities, Plantars down going.  4. Ears and Eyes appear Normal, Conjunctivae clear, PERRLA. Moist Oral Mucosa.  5. Supple Neck, No JVD, No cervical lymphadenopathy appriciated, No Carotid Bruits.  6. Symmetrical Chest wall movement, Good air movement bilaterally, CTAB.  7. RRR, No Gallops, Rubs or Murmurs, No Parasternal Heave.  8. Positive  Bowel Sounds, Abdomen Soft, No tenderness, No organomegaly appriciated,No rebound -guarding or rigidity. Foley in place  9.  No Cyanosis, Normal Skin Turgor, No Skin Rash or Bruise.  10. Good muscle tone,  joints appear normal , no effusions, Normal ROM.  11. No Palpable Lymph Nodes in Neck or Axillae     Data Review  CBC  Recent Labs Lab 08/09/14 1010  WBC 2.3*  HGB 11.9*  HCT 35.4*  PLT 244  MCV 87.0  MCH 29.2  MCHC 33.6  RDW 13.0  LYMPHSABS 1.0  MONOABS 0.3  EOSABS 0.1  BASOSABS 0.0   ------------------------------------------------------------------------------------------------------------------  Chemistries   Recent Labs Lab 08/09/14 1010  NA 131*  K 3.9  CL 101  CO2 23  GLUCOSE 105*  BUN 6  CREATININE 0.75  CALCIUM 9.6  AST 23  ALT 16  ALKPHOS 42  BILITOT 0.8   ------------------------------------------------------------------------------------------------------------------ CrCl cannot be calculated (Unknown ideal weight.). ------------------------------------------------------------------------------------------------------------------ No results for input(s): TSH, T4TOTAL, T3FREE, THYROIDAB in the last 72 hours.  Invalid input(s): FREET3   Coagulation profile  Recent Labs Lab 08/09/14 1010  INR 1.01   ------------------------------------------------------------------------------------------------------------------- No results for input(s): DDIMER in the last 72 hours. -------------------------------------------------------------------------------------------------------------------  Cardiac Enzymes  Recent Labs Lab 08/09/14 1010  TROPONINI <0.03   ------------------------------------------------------------------------------------------------------------------ Invalid input(s): POCBNP   ---------------------------------------------------------------------------------------------------------------  Urinalysis    Component  Value Date/Time   COLORURINE YELLOW 08/09/2014 1050   APPEARANCEUR CLEAR 08/09/2014 1050   LABSPEC 1.006 08/09/2014 1050   PHURINE 7.5 08/09/2014 1050   GLUCOSEU NEGATIVE 08/09/2014 1050   HGBUR NEGATIVE 08/09/2014 1050   BILIRUBINUR NEGATIVE 08/09/2014 1050   KETONESUR NEGATIVE 08/09/2014 1050   PROTEINUR NEGATIVE 08/09/2014 1050   UROBILINOGEN 0.2 08/09/2014 1050   NITRITE NEGATIVE 08/09/2014 1050   LEUKOCYTESUR NEGATIVE 08/09/2014 1050    ----------------------------------------------------------------------------------------------------------------  Imaging results:   Dg Knee 2 Views Left  08/09/2014   CLINICAL DATA:  Abdominal pain and weakness. Pain in the left hip and knee. No known injury  EXAM: LEFT KNEE - 1-2 VIEW  COMPARISON:  11/28/2005  FINDINGS: There is no evidence of fracture, dislocation, or joint effusion.  Medial compartment osteoarthritis with moderate marginal spurring. There is degenerative marginal spurring at the patellofemoral compartment, with joint narrowing, that has progressed from 2007.  Osteopenia.  IMPRESSION: 1. No acute osseous findings. 2. Knee osteoarthritis with mild progression from 2007.   Electronically Signed   By: Monte Fantasia M.D.   On: 08/09/2014 13:15   Ct Abdomen Pelvis W Contrast  08/09/2014   CLINICAL DATA:  Abdominal pain and urinary frequency  EXAM: CT ABDOMEN AND PELVIS WITH CONTRAST  TECHNIQUE: Multidetector CT imaging of the abdomen and pelvis was performed using the standard protocol following bolus administration of intravenous contrast.  CONTRAST:  173mL OMNIPAQUE IOHEXOL 300 MG/ML  SOLN  COMPARISON:  02/28/2013  FINDINGS: Lung bases are free of acute infiltrate or sizable effusion.  The liver, gallbladder, spleen, adrenal glands and pancreas are within normal limits. The kidneys are well visualized  and show no evidence of renal calculi or obstructive changes. Renal vascular calcifications are again noted. No obstructive changes are  seen.  The bladder is well distended with evidence of a Foley catheter within. Air is noted within the bladder consistent with the recent catheter placement. The appendix is not visualized consistent with a prior surgical history. No pelvic mass lesion is noted. The uterus is been surgically removed. Diffuse diverticular change of the colon is noted. No diverticulitis is seen. Aortoiliac calcifications are noted without aneurysmal dilatation. The osseous structures show show degenerative changes of the hip joints and lumbar spine. No acute bony abnormality is seen.  IMPRESSION: Diverticulosis without diverticulitis.  Status post appendectomy and hysterectomy  No acute abnormality is noted.   Electronically Signed   By: Inez Catalina M.D.   On: 08/09/2014 14:13   Dg Abd Acute W/chest  08/09/2014   CLINICAL DATA:  Nausea and vomiting with abdominal pain  EXAM: ACUTE ABDOMEN SERIES (ABDOMEN 2 VIEW & CHEST 1 VIEW)  COMPARISON:  04/22/2014  FINDINGS: Cardiac shadow is mildly enlarged but stable. The lungs are clear bilaterally. No focal infiltrate is noted. The abdomen show scattered large and small bowel gas. No obstructive changes are. No free air is seen. No acute bony abnormality is noted.  IMPRESSION: No acute abnormality noted.   Electronically Signed   By: Inez Catalina M.D.   On: 08/09/2014 11:48   Dg Hip Unilat With Pelvis 2-3 Views Left  08/09/2014   CLINICAL DATA:  Pain all over the left hip and left knee. No known injury.  EXAM: LEFT HIP (WITH PELVIS) 2-3 VIEWS  COMPARISON:  10/28/2013  FINDINGS: There appears to be oral contrast within loops of bowel. The pelvic bony ring is intact. Medial joint space narrowing in both hips and similar to the prior examination. Mild cortical irregularity involving the femoral necks bilaterally and unchanged. Left hip is located without a fracture.  IMPRESSION: No acute bone abnormality in the pelvis or left hip.  Osteoarthritic changes in both hips.   Electronically  Signed   By: Markus Daft M.D.   On: 08/09/2014 13:16    My personal review of EKG: Rhythm NSR,   no Acute ST changes    Assessment & Plan   1. Urinary retention. Likely due to constipation she had for the last week. She had 1 L of urine on bladder scan, Foley catheter placed, will place on Flomax, leave Foley in for at least 3 days. Outpatient follow-up with urology.   2. Severe constipation. Had a BM last night, will initiate on bowel regimen and monitor.   3. CAD, history of paroxysmal atrial fibrillation, [place] Vasc 2 is 6 - no acute issues, EKG is sinus, chest pain free. Continue aspirin, hydralazine and Imdur.   4. Dyslipidemia. Continue statin.   5. GERD. Continue PPI.   6. Diet-controlled type 2 diabetes mellitus. Check A1c. Sliding scale.   7. Essential hypertension. Since she is dehydrated we'll leave her only on low-dose Norvasc and hydralazine. Hold diuretic and ACE inhibitor.   8. Mild hyponatremia. Due to dehydration, and rehydrate repeat BMP in the morning.   9. Chronic leukopenia. Outpatient age appropriate workup.      DVT Prophylaxis Heparin    AM Labs Ordered, also please review Full Orders  Family Communication: Admission, patients condition and plan of care including tests being ordered have been discussed with the patient and  indicates understanding and agree with the plan and Code Status.  Code Status Full  Likely DC to  ALF  Condition Fair  Time spent in minutes : 35    Ramonia Mcclaran K M.D on 08/09/2014 at 4:13 PM  Between 7am to 7pm - Pager - [phone]  After 7pm go to [email or web address] - [facility] Office  [phone]

## 2014-08-09 NOTE — ED Notes (Signed)
Ambulated patient in hallway 2 assisit very unstable

## 2014-08-09 NOTE — Progress Notes (Signed)
Report received from [place], [place] for patient to be admitted into 5w35

## 2014-08-09 NOTE — Progress Notes (Signed)
Notified MD Candiss Norse that patients BP is 191/64. Orders received to administer PO Hydralazine 50mg  now and  Hydralazine 75mg  TID ordered. WIll continue to monitor.

## 2014-08-09 NOTE — ED Notes (Signed)
Attempted Report x1.   

## 2014-08-10 DIAGNOSIS — I10 Essential (primary) hypertension: Secondary | ICD-10-CM | POA: Diagnosis not present

## 2014-08-10 DIAGNOSIS — R109 Unspecified abdominal pain: Secondary | ICD-10-CM | POA: Diagnosis not present

## 2014-08-10 DIAGNOSIS — K5900 Constipation, unspecified: Secondary | ICD-10-CM | POA: Diagnosis not present

## 2014-08-10 DIAGNOSIS — R339 Retention of urine, unspecified: Secondary | ICD-10-CM | POA: Diagnosis not present

## 2014-08-10 LAB — BASIC METABOLIC PANEL
Anion gap: 7 (ref 5–15)
BUN: 8 mg/dL (ref 6–23)
CALCIUM: 8.9 mg/dL (ref 8.4–10.5)
CHLORIDE: 100 mmol/L (ref 96–112)
CO2: 25 mmol/L (ref 19–32)
Creatinine, Ser: 0.96 mg/dL (ref 0.50–1.10)
GFR calc Af Amer: 61 mL/min — ABNORMAL LOW (ref >90)
GFR, EST NON AFRICAN AMERICAN: 52 mL/min — AB (ref >90)
GLUCOSE: 87 mg/dL (ref 70–99)
Potassium: 4 mmol/L (ref 3.5–5.1)
SODIUM: 132 mmol/L — AB (ref 135–145)

## 2014-08-10 LAB — CBC
HCT: 34.4 % — ABNORMAL LOW (ref 36.0–46.0)
Hemoglobin: 11.4 g/dL — ABNORMAL LOW (ref 12.0–15.0)
MCH: 29.1 pg (ref 26.0–34.0)
MCHC: 33.1 g/dL (ref 30.0–36.0)
MCV: 87.8 fL (ref 78.0–100.0)
PLATELETS: 213 10*3/uL (ref 150–400)
RBC: 3.92 MIL/uL (ref 3.87–5.11)
RDW: 13.4 % (ref 11.5–15.5)
WBC: 3.8 10*3/uL — ABNORMAL LOW (ref 4.0–10.5)

## 2014-08-10 LAB — MRSA PCR SCREENING: MRSA by PCR: POSITIVE — AB

## 2014-08-10 LAB — GLUCOSE, CAPILLARY
GLUCOSE-CAPILLARY: 111 mg/dL — AB (ref 70–99)
GLUCOSE-CAPILLARY: 109 mg/dL — AB (ref 70–99)
GLUCOSE-CAPILLARY: 126 mg/dL — AB (ref 70–99)
Glucose-Capillary: 87 mg/dL (ref 70–99)

## 2014-08-10 MED ORDER — HYDROCODONE-ACETAMINOPHEN 5-325 MG PO TABS
1.0000 | ORAL_TABLET | ORAL | Status: DC | PRN
  Administered 2014-08-10 – 2014-08-12 (×6): 1 via ORAL
  Filled 2014-08-10 (×5): qty 1

## 2014-08-10 MED ORDER — MUPIROCIN 2 % EX OINT
1.0000 "application " | TOPICAL_OINTMENT | Freq: Two times a day (BID) | CUTANEOUS | Status: DC
  Administered 2014-08-10 – 2014-08-12 (×6): 1 via NASAL
  Filled 2014-08-10: qty 22

## 2014-08-10 MED ORDER — CALCIUM CARBONATE ANTACID 500 MG PO CHEW
2.0000 | CHEWABLE_TABLET | Freq: Three times a day (TID) | ORAL | Status: DC | PRN
  Filled 2014-08-10: qty 2

## 2014-08-10 MED ORDER — CHLORHEXIDINE GLUCONATE CLOTH 2 % EX PADS
6.0000 | MEDICATED_PAD | Freq: Every day | CUTANEOUS | Status: DC
  Administered 2014-08-10 – 2014-08-11 (×2): 6 via TOPICAL

## 2014-08-10 MED ORDER — MAGNESIUM HYDROXIDE 400 MG/5ML PO SUSP
30.0000 mL | Freq: Once | ORAL | Status: AC
  Administered 2014-08-10: 30 mL via ORAL
  Filled 2014-08-10: qty 30

## 2014-08-10 NOTE — Progress Notes (Signed)
Placed patient on CPAP, 7cmH20.  Patient is tolerating well at this time.

## 2014-08-10 NOTE — Progress Notes (Signed)
INITIAL NUTRITION ASSESSMENT  DOCUMENTATION CODES Per approved criteria  -Not Applicable   INTERVENTION: 1. Ensure Complete po BID, each supplement provides 350 kcal and 13 grams of protein  2. Encourage PO intake as constipation is resolving  NUTRITION DIAGNOSIS: Inadequate oral intake related to constipation as evidenced by only eating 25% of meals  Goal: Pt to meet >/= 90% of their estimated nutrition needs   Monitor:  Oral intake, constipation resoluion, weight, labs/procedures  Reason for Assessment: MST 2  81 y.o. female  Admitting Dx: Urinary retention  ASSESSMENT: 81 y.o. female, with history of mild dementia, CAD, hypertension, dyslipidemia, GERD, sleep apnea, type 2 diabetes mellitus, history of paroxysmal atrial fibrillation. to the hospital with 1 week history of severe constipation, she also developed inability to pass urine for the last day or so with some abdominal pain and discomfort along with nausea vomiting  Spoke with patient who was very upset because she said she was no longer able to feed herself due to severe pain in her hands from arthritis. She said they don't help her eat where she lives. She also said she has had such severe constipation that she has been afraid to eat and rarely eats full meals anymore because she gets severe stomach pains. She reports eating only 25% of her meals.   She says she was 200#s a year ago when she entered her assisted living home marking a 43 pound loss in 1 year (Weight documentation contradicts-pt with mild dementia)  Nutrition Focused Physical Exam: None all areas  Height: Ht Readings from Last 1 Encounters:  08/09/14 5\' 3"  (1.6 m)    Weight: Wt Readings from Last 1 Encounters:  08/10/14 157 lb 6.5 oz (71.4 kg)    Ideal Body Weight: 115  % Ideal Body Weight: 136%  Wt Readings from Last 10 Encounters:  08/10/14 157 lb 6.5 oz (71.4 kg)  03/05/14 160 lb (72.576 kg)  09/06/13 157 lb (71.215 kg)  07/17/13 159  lb 6.4 oz (72.303 kg)  06/05/13 161 lb 3.2 oz (73.12 kg)  03/01/13 167 lb (75.751 kg)  11/30/12 176 lb (79.833 kg)  11/16/12 175 lb 6.4 oz (79.561 kg)  10/24/12 165 lb (74.844 kg)  08/22/12 179 lb (81.194 kg)    Usual Body Weight: would appear to be ~160  % Usual Body Weight: 98%   BMI:  Body mass index is 27.89 kg/(m^2).  Estimated Nutritional Needs: Kcal: [phone] (20-25 kcals/kg) Protein: 78.5-93 (1.1-1.3g/Kcal) Fluid: 1.4-1.75 liters  Skin: WDL  Diet Order:  Heart  EDUCATION NEEDS: -No education needs identified at this time   Intake/Output Summary (Last 24 hours) at 08/10/14 1443 Last data filed at 08/10/14 1057  Gross per 24 hour  Intake 1497.5 ml  Output   1750 ml  Net -252.5 ml  eating 50% of meals  Last BM: 2/12  Labs:   Recent Labs Lab 08/09/14 1010 08/10/14 0620  NA 131* 132*  K 3.9 4.0  CL 101 100  CO2 23 25  BUN 6 8  CREATININE 0.75 0.96  CALCIUM 9.6 8.9  GLUCOSE 105* 87    CBG (last 3)   Recent Labs  08/09/14 2257 08/10/14 0832 08/10/14 1246  GLUCAP 136* 87 111*    Scheduled Meds: . amLODipine  10 mg Oral Daily  . aspirin  81 mg Oral Daily  . bisacodyl  10 mg Rectal Daily  . calcium-vitamin D  1 tablet Oral Daily  . Chlorhexidine Gluconate Cloth  6 each Topical Q0600  .  citalopram  20 mg Oral QAC breakfast  . docusate sodium  200 mg Oral BID  . feeding supplement (ENSURE COMPLETE)  237 mL Oral BID BM  . fluticasone  2 spray Each Nare Daily  . heparin  5,000 Units Subcutaneous 3 times per day  . hydrALAZINE  75 mg Oral TID  . insulin aspart  0-5 Units Subcutaneous QHS  . insulin aspart  0-9 Units Subcutaneous TID WC  . isosorbide mononitrate  60 mg Oral QAC breakfast  . ketorolac  1 drop Both Eyes Daily  . loratadine  10 mg Oral Daily  . lubiprostone  24 mcg Oral BID WC  . mirabegron ER  50 mg Oral Daily  . mupirocin ointment  1 application Nasal BID  . pantoprazole  40 mg Oral Daily  . polyethylene glycol  17 g Oral  BID  . rosuvastatin  5 mg Oral QHS  . tamsulosin  0.4 mg Oral Daily    Continuous Infusions:   Past Medical History  Diagnosis Date  . Coronary artery disease   . Dementia   . Hypertension   . Hyperlipidemia   . Anxiety   . GERD (gastroesophageal reflux disease)   . CHF (congestive heart failure)   . Polyp, stomach 11/11/2006  . Aphakia of left eye   . Obese   . MI (myocardial infarction)   . Chest pain at rest 12/20/2011  . CAD (coronary artery disease), cutting balloon athrectomy of her dominant AV groove of LCX.  2007 12/20/2011  . Bradycardia 12/20/2011  . HTN (hypertension) 12/20/2011  . Dyslipidemia  12/20/2011  . Cardiomyopathy   . History of GI bleed   . Atrial fibrillation   . Sigmoid diverticulosis   . Anemia, iron deficiency 10/24/2012  . Constipation due to pain medication 10/24/2012  . Cataract     "coming back in both eyes" (06/19/2013)  . Chronic bronchitis     "I have it all year round"  . Shortness of breath     "comes on at anytime" (06/19/2013)  . OSA on CPAP 12/20/2011  . Chronic back pain     "all over my back"   . Breast cancer, right breast   . Type II diabetes mellitus   . Headache     "right often" (08/09/2014)  . DJD (degenerative joint disease)   . Osteoarthritis   . Arthritis     "all over my body"  . Nephrolithiasis     bilateral    Past Surgical History  Procedure Laterality Date  . Back surgery    . Appendectomy    . Fracture surgery    . Dilation and curettage of uterus    . Esophagogastroduodenoscopy    . Colonoscopy    . Eus    . Esophagogastroduodenoscopy  02/21/2012    Procedure: ESOPHAGOGASTRODUODENOSCOPY (EGD);  Surgeon: Gatha Mayer, MD;  Location: [facility];  Service: Endoscopy;  Laterality: N/A;  pt. being evaluated for a pacemaker  . Balloon dilation  02/21/2012    Procedure: BALLOON DILATION;  Surgeon: Gatha Mayer, MD;  Location: [facility];  Service: Endoscopy;  Laterality: N/A;  . Colonoscopy N/A 10/24/2012     Procedure: COLONOSCOPY;  Surgeon: Gatha Mayer, MD;  Location: [facility];  Service: Endoscopy;  Laterality: N/A;  . US echocardiography  01/25/2008    mild DUST,mild MR,mod. ca+ mitral annular,AOV mildly sclerotid  . Lexiscan mycardial perfusion scan  01/25/2008    Normal  . Inguinal hernia repair Right 06/18/2013  .  Tonsillectomy    . Total abdominal hysterectomy    . Cataract extraction w/ intraocular lens  implant, bilateral Bilateral   . Breast biopsy Right   . Breast lumpectomy Right   . Inguinal hernia repair Right 06/18/2013    Procedure: HERNIA REPAIR INGUINAL ADULT;  Surgeon: Gwenyth Ober, MD;  Location: [facility];  Service: General;  Laterality: Right;  . Insertion of mesh Right 06/18/2013    Procedure: INSERTION OF MESH;  Surgeon: Gwenyth Ober, MD;  Location: [facility];  Service: General;  Laterality: Right;  . Left heart catheterization with coronary angiogram N/A 12/21/2011    Procedure: LEFT HEART CATHETERIZATION WITH CORONARY ANGIOGRAM;  Surgeon: Lorretta Harp, MD;  Location: [facility];  Service: Cardiovascular;  Laterality: N/A;  . Glaucoma surgery Bilateral   . Eye surgery    . Coronary angioplasty with stent placement      "Dr. Alvester Chou put it in; it's been in a long time"  . Coronary angioplasty  03-07-2004    cutting balloon  . Cardiac catheterization  06/05/2004    high grade disease AV groove CX  . Cardiac catheterization  04/19/2005    noncritical CAD    Burtis Junes RD, LDN Nutrition Pager: [phone] 08/10/2014 2:43 PM

## 2014-08-10 NOTE — Progress Notes (Addendum)
[facility] PROGRESS NOTE  [name] TKP:[id number] DOB: 1926/10/24 DOA: 08/09/2014 PCP: Reymundo Poll, MD  Assessment/Plan   Urinary retention. Likely due to constipation she had for the last week. She had 1 L of urine on bladder scan -  Once constipation resolved, will attempt voiding trial -  Continue foley today -  Continue flomax -  Outpatient follow-up with urology.  Severe constipation, unresolved.  Likely medication side effect -  TSH wnl -  Continue bisacodyl, bid miralax -  Per patient request, will give dose of milk of magnesia  3. CAD, history of paroxysmal atrial fibrillation, [place] Vasc 2 is 6 - no acute issues, EKG is sinus, chest pain free. Continue aspirin, hydralazine and Imdur.   4. Dyslipidemia. Continue statin.   5. GERD. Continue PPI.   6. Diet-controlled type 2 diabetes mellitus. CBG stable -  A1c pending -  Continue Sliding scale.   7. Essential hypertension, labile BPs yesterday - continue Norvasc and hydralazine -  Continue to hold diuretic and ACE inhibitor.   8. Mild hyponatremia, may have reset osmostat, mildly improved with IVF -  D/c IVF as eating and drinking well  9. Chronic leukopenia. Outpatient age appropriate workup.  Diet:  Healthy heart Access:  PIV IVF:  off Proph:  heparin  Code Status: full Family Communication: patient alone Disposition Plan:  Pending resolution of constipation and voiding trial, awaiting PT/OT assessment   Consultants:  none  Procedures:  KUB  Hip Xr  Knee XR  CT abd/pelvis  Antibiotics:  none   HPI/Subjective:  Still having nausea and constipation.  Trying to eat a little lunch, but feeling sick to her stomach    Objective: Filed Vitals:   08/09/14 2131 08/09/14 2133 08/09/14 2158 08/10/14 0543  BP: 90/38 83/46 117/54 161/53  Pulse: 56   58  Temp: 98.7 F (37.1 C)   98.7 F (37.1 C)  TempSrc: Oral   Oral  Resp: 18   18  Height:      Weight:    71.4 kg (157 lb 6.5  oz)  SpO2: 95%   98%    Intake/Output Summary (Last 24 hours) at 08/10/14 1338 Last data filed at 08/10/14 1057  Gross per 24 hour  Intake 1497.5 ml  Output   1750 ml  Net -252.5 ml   Filed Weights   08/09/14 1825 08/10/14 0543  Weight: 71.4 kg (157 lb 6.5 oz) 71.4 kg (157 lb 6.5 oz)    Exam:   General:  Overweight f, No acute distress  HEENT:  NCAT, MMM  Cardiovascular:  Distant heart sounds, no obvious murmur, 2+ pulses, warm extremities  Respiratory:  CTAB, no increased WOB  Abdomen:   Hyperactive BS, soft, mildly distended, mild TTP diffusely, worse in the LLQ  MSK:   Normal tone and bulk, no LEE  Neuro:  Grossly moves extremities, no facial droop  Data Reviewed: Basic Metabolic Panel:  Recent Labs Lab 08/09/14 1010 08/10/14 0620  NA 131* 132*  K 3.9 4.0  CL 101 100  CO2 23 25  GLUCOSE 105* 87  BUN 6 8  CREATININE 0.75 0.96  CALCIUM 9.6 8.9   Liver Function Tests:  Recent Labs Lab 08/09/14 1010  AST 23  ALT 16  ALKPHOS 42  BILITOT 0.8  PROT 7.3  ALBUMIN 3.9    Recent Labs Lab 08/09/14 1010  LIPASE 49   No results for input(s): AMMONIA in the last 168 hours. CBC:  Recent Labs Lab 08/09/14 1010  08/10/14 0620  WBC 2.3* 3.8*  NEUTROABS 0.9*  --   HGB 11.9* 11.4*  HCT 35.4* 34.4*  MCV 87.0 87.8  PLT 244 213   Cardiac Enzymes:  Recent Labs Lab 08/09/14 1010  TROPONINI <0.03   BNP (last 3 results) No results for input(s): BNP in the last 8760 hours.  ProBNP (last 3 results)  Recent Labs  04/22/14 1939  PROBNP 103.1    CBG:  Recent Labs Lab 08/09/14 1919 08/09/14 2257 08/10/14 0832 08/10/14 1246  GLUCAP 204* 136* 87 111*    Recent Results (from the past 240 hour(s))  MRSA PCR Screening     Status: Abnormal   Collection Time: 08/09/14  6:57 PM  Result Value Ref Range Status   MRSA by PCR POSITIVE (A) NEGATIVE Final    Comment:        The GeneXpert MRSA Assay (FDA approved for NASAL specimens only), is one  component of a comprehensive MRSA colonization surveillance program. It is not intended to diagnose MRSA infection nor to guide or monitor treatment for MRSA infections. RESULT CALLED TO, READ BACK BY AND VERIFIED WITH: T.BULTER,RN AT 0121 BY L.PITT 08/10/14      Studies: Dg Knee 2 Views Left  08/09/2014   CLINICAL DATA:  Abdominal pain and weakness. Pain in the left hip and knee. No known injury  EXAM: LEFT KNEE - 1-2 VIEW  COMPARISON:  11/28/2005  FINDINGS: There is no evidence of fracture, dislocation, or joint effusion.  Medial compartment osteoarthritis with moderate marginal spurring. There is degenerative marginal spurring at the patellofemoral compartment, with joint narrowing, that has progressed from 2007.  Osteopenia.  IMPRESSION: 1. No acute osseous findings. 2. Knee osteoarthritis with mild progression from 2007.   Electronically Signed   By: Monte Fantasia M.D.   On: 08/09/2014 13:15   Ct Abdomen Pelvis W Contrast  08/09/2014   CLINICAL DATA:  Abdominal pain and urinary frequency  EXAM: CT ABDOMEN AND PELVIS WITH CONTRAST  TECHNIQUE: Multidetector CT imaging of the abdomen and pelvis was performed using the standard protocol following bolus administration of intravenous contrast.  CONTRAST:  166mL OMNIPAQUE IOHEXOL 300 MG/ML  SOLN  COMPARISON:  02/28/2013  FINDINGS: Lung bases are free of acute infiltrate or sizable effusion.  The liver, gallbladder, spleen, adrenal glands and pancreas are within normal limits. The kidneys are well visualized and show no evidence of renal calculi or obstructive changes. Renal vascular calcifications are again noted. No obstructive changes are seen.  The bladder is well distended with evidence of a Foley catheter within. Air is noted within the bladder consistent with the recent catheter placement. The appendix is not visualized consistent with a prior surgical history. No pelvic mass lesion is noted. The uterus is been surgically removed. Diffuse  diverticular change of the colon is noted. No diverticulitis is seen. Aortoiliac calcifications are noted without aneurysmal dilatation. The osseous structures show show degenerative changes of the hip joints and lumbar spine. No acute bony abnormality is seen.  IMPRESSION: Diverticulosis without diverticulitis.  Status post appendectomy and hysterectomy  No acute abnormality is noted.   Electronically Signed   By: Inez Catalina M.D.   On: 08/09/2014 14:13   Dg Abd Acute W/chest  08/09/2014   CLINICAL DATA:  Nausea and vomiting with abdominal pain  EXAM: ACUTE ABDOMEN SERIES (ABDOMEN 2 VIEW & CHEST 1 VIEW)  COMPARISON:  04/22/2014  FINDINGS: Cardiac shadow is mildly enlarged but stable. The lungs are clear bilaterally. No focal infiltrate  is noted. The abdomen show scattered large and small bowel gas. No obstructive changes are. No free air is seen. No acute bony abnormality is noted.  IMPRESSION: No acute abnormality noted.   Electronically Signed   By: Inez Catalina M.D.   On: 08/09/2014 11:48   Dg Hip Unilat With Pelvis 2-3 Views Left  08/09/2014   CLINICAL DATA:  Pain all over the left hip and left knee. No known injury.  EXAM: LEFT HIP (WITH PELVIS) 2-3 VIEWS  COMPARISON:  10/28/2013  FINDINGS: There appears to be oral contrast within loops of bowel. The pelvic bony ring is intact. Medial joint space narrowing in both hips and similar to the prior examination. Mild cortical irregularity involving the femoral necks bilaterally and unchanged. Left hip is located without a fracture.  IMPRESSION: No acute bone abnormality in the pelvis or left hip.  Osteoarthritic changes in both hips.   Electronically Signed   By: Markus Daft M.D.   On: 08/09/2014 13:16    Scheduled Meds: . amLODipine  10 mg Oral Daily  . aspirin  81 mg Oral Daily  . bisacodyl  10 mg Rectal Daily  . calcium-vitamin D  1 tablet Oral Daily  . Chlorhexidine Gluconate Cloth  6 each Topical Q0600  . citalopram  20 mg Oral QAC breakfast  .  docusate sodium  200 mg Oral BID  . feeding supplement (ENSURE COMPLETE)  237 mL Oral BID BM  . fluticasone  2 spray Each Nare Daily  . heparin  5,000 Units Subcutaneous 3 times per day  . hydrALAZINE  75 mg Oral TID  . insulin aspart  0-5 Units Subcutaneous QHS  . insulin aspart  0-9 Units Subcutaneous TID WC  . isosorbide mononitrate  60 mg Oral QAC breakfast  . ketorolac  1 drop Both Eyes Daily  . loratadine  10 mg Oral Daily  . lubiprostone  24 mcg Oral BID WC  . magnesium hydroxide  30 mL Oral Once  . mirabegron ER  50 mg Oral Daily  . mupirocin ointment  1 application Nasal BID  . pantoprazole  40 mg Oral Daily  . polyethylene glycol  17 g Oral BID  . rosuvastatin  5 mg Oral QHS  . tamsulosin  0.4 mg Oral Daily   Continuous Infusions:   Principal Problem:   Urinary retention Active Problems:   CAD (coronary artery disease), cutting balloon athrectomy of her dominant AV groove of LCX.  2007   OSA on CPAP   DM (diabetes mellitus)   HTN (hypertension)   Constipation   Weakness    Time spent: 30 min    Essa Wenk, [facility] Pager [phone]. If 7PM-7AM, please contact night-coverage at [email or web address], password [facility] 08/10/2014, 1:38 PM

## 2014-08-10 NOTE — Progress Notes (Signed)
Utilization Review completed.  

## 2014-08-10 NOTE — Evaluation (Signed)
Physical Therapy Evaluation Patient Details Name: [name] MRN: [id number] DOB: 1926/08/07 Today's Date: 08/10/2014   History of Present Illness  [name] is a 81 y.o. female, with history of mild dementia and lives in an assisted living, CAD, hypertension, dyslipidemia, GERD, sleep apnea on C Pap night, type 2 diabetes mellitus, history of paroxysmal atrial fibrillation, who comes to the hospital with 1 week history of severe constipation, she also developed inability to pass urine for the last day or so with some abdominal pain and discomfort along with nausea vomiting this morning, she had a bowel movement yesterday after a long time and it was hard, she developed some gradual generalized weakness  Clinical Impression  Pt admitted with/for urinary retention and weakness.  Pt currently limited functionally due to the problems listed below.  (see problems list.)  Pt will benefit from PT to maximize function and safety to be able to get home safely with available assist of staff.     Follow Up Recommendations Home health PT;Other (comment) (at [facility])    Equipment Recommendations  None recommended by PT;Other (comment) (TBA further)    Recommendations for Other Services       Precautions / Restrictions Precautions Precautions: Fall Restrictions Weight Bearing Restrictions: No      Mobility  Bed Mobility Overal bed mobility: Needs Assistance Bed Mobility: Supine to Sit     Supine to sit: Min assist     General bed mobility comments: assist for initiation, LE's and truncal assist  Transfers Overall transfer level: Needs assistance Equipment used: Rolling walker (2 wheeled) Transfers: Sit to/from Stand Sit to Stand: Min assist         General transfer comment: assist to come forward, cues for hand placement  Ambulation/Gait Ambulation/Gait assistance: Min assist Ambulation Distance (Feet): 16 Feet Assistive device: Rolling walker (2 wheeled) Gait  Pattern/deviations: Step-through pattern Gait velocity: slow and tentative Gait velocity interpretation: Below normal speed for age/gender General Gait Details: short uncoordinated steps, quick to fatigue.  Needed help maneuvering RW  Stairs            Wheelchair Mobility    Modified Rankin (Stroke Patients Only)       Balance Overall balance assessment: Needs assistance Sitting-balance support: No upper extremity supported Sitting balance-Leahy Scale: Fair     Standing balance support: Bilateral upper extremity supported Standing balance-Leahy Scale: Poor                               Pertinent Vitals/Pain Pain Assessment: Faces Faces Pain Scale: Hurts little more Pain Location: arthritis all over.    Home Living Family/patient expects to be discharged to:: Assisted living               Home Equipment: Gilford Rile - 2 wheels;Wheelchair - manual Additional Comments: pt reports having to have more help lately: "    Prior Function Level of Independence: Needs assistance   Gait / Transfers Assistance Needed: assisted or uses w/c           Hand Dominance        Extremity/Trunk Assessment                         Communication   Communication: No difficulties  Cognition Arousal/Alertness: Awake/alert Behavior During Therapy: WFL for tasks assessed/performed Overall Cognitive Status: History of cognitive impairments - at baseline  General Comments      Exercises        Assessment/Plan    PT Assessment Patient needs continued PT services  PT Diagnosis Difficulty walking;Generalized weakness   PT Problem List Decreased strength;Decreased activity tolerance;Decreased balance;Decreased mobility;Decreased coordination;Decreased knowledge of use of DME;Decreased knowledge of precautions  PT Treatment Interventions DME instruction;Gait training;Functional mobility training;Therapeutic  activities;Therapeutic exercise;Balance training;Patient/family education   PT Goals (Current goals can be found in the Care Plan section) Acute Rehab PT Goals Patient Stated Goal: pt didn't state, but wants to make it back to [facility] and get therapy. PT Goal Formulation: With patient Time For Goal Achievement: 08/24/14 Potential to Achieve Goals: Good    Frequency Min 3X/week   Barriers to discharge        Co-evaluation               End of Session   Activity Tolerance: Patient tolerated treatment well;Patient limited by fatigue Patient left: in chair;with call bell/phone within reach Nurse Communication: Mobility status    Functional Assessment Tool Used: clinical judgement Functional Limitation: Mobility: Walking and moving around Mobility: Walking and Moving Around Current Status (V4259): At least 20 percent but less than 40 percent impaired, limited or restricted Mobility: Walking and Moving Around Goal Status [phone]): At least 1 percent but less than 20 percent impaired, limited or restricted    Time: 0855-0921 PT Time Calculation (min) (ACUTE ONLY): 26 min   Charges:   PT Evaluation $Initial PT Evaluation Tier I: 1 Procedure PT Treatments $Gait Training: 8-22 mins   PT G Codes:   PT G-Codes **NOT FOR INPATIENT CLASS** Functional Assessment Tool Used: clinical judgement Functional Limitation: Mobility: Walking and moving around Mobility: Walking and Moving Around Current Status (F6433): At least 20 percent but less than 40 percent impaired, limited or restricted Mobility: Walking and Moving Around Goal Status [phone]): At least 1 percent but less than 20 percent impaired, limited or restricted    Yobany Vroom, Tessie Fass 08/10/2014, 11:02 AM 08/10/2014  Donnella Sham, [place] [phone]  (pager)

## 2014-08-11 DIAGNOSIS — R109 Unspecified abdominal pain: Secondary | ICD-10-CM | POA: Diagnosis not present

## 2014-08-11 DIAGNOSIS — K5900 Constipation, unspecified: Secondary | ICD-10-CM | POA: Diagnosis not present

## 2014-08-11 DIAGNOSIS — R339 Retention of urine, unspecified: Secondary | ICD-10-CM | POA: Diagnosis not present

## 2014-08-11 LAB — CBC
HCT: 34.3 % — ABNORMAL LOW (ref 36.0–46.0)
HEMOGLOBIN: 11.2 g/dL — AB (ref 12.0–15.0)
MCH: 29.2 pg (ref 26.0–34.0)
MCHC: 32.7 g/dL (ref 30.0–36.0)
MCV: 89.6 fL (ref 78.0–100.0)
Platelets: 213 10*3/uL (ref 150–400)
RBC: 3.83 MIL/uL — AB (ref 3.87–5.11)
RDW: 13.3 % (ref 11.5–15.5)
WBC: 4.2 10*3/uL (ref 4.0–10.5)

## 2014-08-11 LAB — GLUCOSE, CAPILLARY
GLUCOSE-CAPILLARY: 91 mg/dL (ref 70–99)
GLUCOSE-CAPILLARY: 144 mg/dL — AB (ref 70–99)
GLUCOSE-CAPILLARY: 167 mg/dL — AB (ref 70–99)
Glucose-Capillary: 112 mg/dL — ABNORMAL HIGH (ref 70–99)

## 2014-08-11 LAB — BASIC METABOLIC PANEL
ANION GAP: 8 (ref 5–15)
BUN: 14 mg/dL (ref 6–23)
CALCIUM: 9.6 mg/dL (ref 8.4–10.5)
CO2: 26 mmol/L (ref 19–32)
CREATININE: 1.05 mg/dL (ref 0.50–1.10)
Chloride: 98 mmol/L (ref 96–112)
GFR calc Af Amer: 55 mL/min — ABNORMAL LOW (ref >90)
GFR calc non Af Amer: 47 mL/min — ABNORMAL LOW (ref >90)
Glucose, Bld: 92 mg/dL (ref 70–99)
POTASSIUM: 4.8 mmol/L (ref 3.5–5.1)
Sodium: 132 mmol/L — ABNORMAL LOW (ref 135–145)

## 2014-08-11 MED ORDER — LOSARTAN POTASSIUM 50 MG PO TABS
100.0000 mg | ORAL_TABLET | Freq: Every day | ORAL | Status: DC
  Administered 2014-08-11 – 2014-08-12 (×2): 100 mg via ORAL
  Filled 2014-08-11 (×2): qty 2

## 2014-08-11 MED ORDER — PEG 3350-KCL-NA BICARB-NACL 420 G PO SOLR
4000.0000 mL | Freq: Once | ORAL | Status: AC
  Administered 2014-08-11: 4000 mL via ORAL
  Filled 2014-08-11: qty 4000

## 2014-08-11 MED ORDER — HYDROCORTISONE ACETATE 25 MG RE SUPP
25.0000 mg | Freq: Two times a day (BID) | RECTAL | Status: DC | PRN
  Filled 2014-08-11: qty 1

## 2014-08-11 NOTE — Progress Notes (Signed)
Patient complaining of nausea/vomiting after approx. 2L NuLYTELY (refused to continue to drink the prep). Patient had a medium bowl movement. She received Zofran IV per PRN order. Will continue to monitor. MD notified.

## 2014-08-11 NOTE — Progress Notes (Signed)
UR completed 

## 2014-08-11 NOTE — Progress Notes (Signed)
[facility] PROGRESS NOTE  [name] EXN:[id number] DOB: [birth year] DOA: 08/09/2014 PCP: Reymundo Poll, MD  Assessment/Plan  Urinary retention. Likely due to constipation she had for the last week. She had 1 L of urine on bladder scan -  Once constipation resolved, will attempt voiding trial -  Continue foley today -  Continue flomax -  Outpatient follow-up with urology.  Severe constipation, unresolved.  Likely medication side effect.  Still no good results after extra miralax, bisacodyl, milk of magnesia yesterday -  TSH wnl -  Continue bisacodyl, bid miralax -  Golytely/nulytely cleanout  CAD, history of paroxysmal atrial fibrillation, [place] Vasc 2 is 6 - no acute issues, EKG is sinus, chest pain free. Continue aspirin, hydralazine and Imdur.  Dyslipidemia. Continue statin.  GERD. Continue PPI.  Diet-controlled type 2 diabetes mellitus. CBG stable -  A1c pending -  Continue Sliding scale.  Essential hypertension, BP rising -  Resume losartan - continue Norvasc and hydralazine -  Continue to hold diuretics   Mild hyponatremia, may have reset osmostat, mildly improved with IVF and now stable.  Chronic leukopenia. Outpatient age appropriate workup.  Diet:  Healthy heart Access:  PIV IVF:  off Proph:  heparin  Code Status: full Family Communication: patient alone Disposition Plan:  Pending resolution of constipation and voiding trial, PT recommending HH PT at [facility]   Consultants:  none  Procedures:  KUB  Hip Xr  Knee XR  CT abd/pelvis  Antibiotics:  none   HPI/Subjective:  Still having nausea and constipation.  Eating somewhat better.  Continues to have severe rectal pain.    Objective: Filed Vitals:   08/10/14 1451 08/10/14 2239 08/11/14 0533 08/11/14 1420  BP: 136/41 129/47 136/53 152/56  Pulse: 82 71 62   Temp: 98.9 F (37.2 C) 98.9 F (37.2 C) 98.5 F (36.9 C) 98.7 F (37.1 C)  TempSrc: Oral Oral Oral Oral  Resp: 19 20 18  20   Height:      Weight:   72 kg (158 lb 11.7 oz)   SpO2: 98% 98% 99% 98%    Intake/Output Summary (Last 24 hours) at 08/11/14 1432 Last data filed at 08/11/14 1421  Gross per 24 hour  Intake   1700 ml  Output   1850 ml  Net   -150 ml   Filed Weights   08/09/14 1825 08/10/14 0543 08/11/14 0533  Weight: 71.4 kg (157 lb 6.5 oz) 71.4 kg (157 lb 6.5 oz) 72 kg (158 lb 11.7 oz)    Exam:   General:  Overweight f, No acute distress  HEENT:  NCAT, MMM  Cardiovascular:  Distant heart sounds, no obvious murmur, 2+ pulses, warm extremities  Respiratory:  CTAB, no increased WOB  Abdomen:   Hyperactive BS, soft, mild to mod distended, mild TTP diffusely, worse in the LLQ, no rebound or guarding  MSK:   Normal tone and bulk, no LEE  Neuro:  Grossly moves extremities, no facial droop  Data Reviewed: Basic Metabolic Panel:  Recent Labs Lab 08/09/14 1010 08/10/14 0620 08/11/14 0530  NA 131* 132* 132*  K 3.9 4.0 4.8  CL 101 100 98  CO2 23 25 26   GLUCOSE 105* 87 92  BUN 6 8 14   CREATININE 0.75 0.96 1.05  CALCIUM 9.6 8.9 9.6   Liver Function Tests:  Recent Labs Lab 08/09/14 1010  AST 23  ALT 16  ALKPHOS 42  BILITOT 0.8  PROT 7.3  ALBUMIN 3.9    Recent Labs Lab 08/09/14  1010  LIPASE 49   No results for input(s): AMMONIA in the last 168 hours. CBC:  Recent Labs Lab 08/09/14 1010 08/10/14 0620 08/11/14 0530  WBC 2.3* 3.8* 4.2  NEUTROABS 0.9*  --   --   HGB 11.9* 11.4* 11.2*  HCT 35.4* 34.4* 34.3*  MCV 87.0 87.8 89.6  PLT 244 213 213   Cardiac Enzymes:  Recent Labs Lab 08/09/14 1010  TROPONINI <0.03   BNP (last 3 results) No results for input(s): BNP in the last 8760 hours.  ProBNP (last 3 results)  Recent Labs  04/22/14 1939  PROBNP 103.1    CBG:  Recent Labs Lab 08/10/14 1246 08/10/14 1748 08/10/14 2235 08/11/14 0804 08/11/14 1219  GLUCAP 111* 109* 126* 91 144*    Recent Results (from the past 240 hour(s))  MRSA PCR  Screening     Status: Abnormal   Collection Time: 08/09/14  6:57 PM  Result Value Ref Range Status   MRSA by PCR POSITIVE (A) NEGATIVE Final    Comment:        The GeneXpert MRSA Assay (FDA approved for NASAL specimens only), is one component of a comprehensive MRSA colonization surveillance program. It is not intended to diagnose MRSA infection nor to guide or monitor treatment for MRSA infections. RESULT CALLED TO, READ BACK BY AND VERIFIED WITH: T.BULTER,RN AT 0121 BY L.PITT 08/10/14      Studies: No results found.  Scheduled Meds: . amLODipine  10 mg Oral Daily  . aspirin  81 mg Oral Daily  . bisacodyl  10 mg Rectal Daily  . calcium-vitamin D  1 tablet Oral Daily  . Chlorhexidine Gluconate Cloth  6 each Topical Q0600  . citalopram  20 mg Oral QAC breakfast  . docusate sodium  200 mg Oral BID  . feeding supplement (ENSURE COMPLETE)  237 mL Oral BID BM  . fluticasone  2 spray Each Nare Daily  . heparin  5,000 Units Subcutaneous 3 times per day  . hydrALAZINE  75 mg Oral TID  . insulin aspart  0-5 Units Subcutaneous QHS  . insulin aspart  0-9 Units Subcutaneous TID WC  . isosorbide mononitrate  60 mg Oral QAC breakfast  . ketorolac  1 drop Both Eyes Daily  . loratadine  10 mg Oral Daily  . lubiprostone  24 mcg Oral BID WC  . mirabegron ER  50 mg Oral Daily  . mupirocin ointment  1 application Nasal BID  . pantoprazole  40 mg Oral Daily  . polyethylene glycol  17 g Oral BID  . rosuvastatin  5 mg Oral QHS  . tamsulosin  0.4 mg Oral Daily   Continuous Infusions:   Principal Problem:   Urinary retention Active Problems:   CAD (coronary artery disease), cutting balloon athrectomy of her dominant AV groove of LCX.  2007   OSA on CPAP   DM (diabetes mellitus)   HTN (hypertension)   Constipation   Weakness    Time spent: 30 min    [name], [facility] Pager [phone]. If 7PM-7AM, please contact night-coverage at [email or web address], password  [facility] 08/11/2014, 2:32 PM

## 2014-08-11 NOTE — Progress Notes (Signed)
Patient did not want to wear CPAP tonight.  RN aware.

## 2014-08-12 DIAGNOSIS — K5900 Constipation, unspecified: Secondary | ICD-10-CM | POA: Diagnosis not present

## 2014-08-12 DIAGNOSIS — R339 Retention of urine, unspecified: Secondary | ICD-10-CM | POA: Diagnosis not present

## 2014-08-12 DIAGNOSIS — R109 Unspecified abdominal pain: Secondary | ICD-10-CM | POA: Diagnosis not present

## 2014-08-12 LAB — CBC
HCT: 34 % — ABNORMAL LOW (ref 36.0–46.0)
HEMOGLOBIN: 11.1 g/dL — AB (ref 12.0–15.0)
MCH: 29 pg (ref 26.0–34.0)
MCHC: 32.6 g/dL (ref 30.0–36.0)
MCV: 88.8 fL (ref 78.0–100.0)
Platelets: 229 10*3/uL (ref 150–400)
RBC: 3.83 MIL/uL — ABNORMAL LOW (ref 3.87–5.11)
RDW: 13.6 % (ref 11.5–15.5)
WBC: 6.2 10*3/uL (ref 4.0–10.5)

## 2014-08-12 LAB — GLUCOSE, CAPILLARY
GLUCOSE-CAPILLARY: 100 mg/dL — AB (ref 70–99)
Glucose-Capillary: 133 mg/dL — ABNORMAL HIGH (ref 70–99)
Glucose-Capillary: 121 mg/dL — ABNORMAL HIGH (ref 70–99)

## 2014-08-12 LAB — BASIC METABOLIC PANEL
Anion gap: 4 — ABNORMAL LOW (ref 5–15)
BUN: 16 mg/dL (ref 6–23)
CO2: 28 mmol/L (ref 19–32)
CREATININE: 0.87 mg/dL (ref 0.50–1.10)
Calcium: 9.4 mg/dL (ref 8.4–10.5)
Chloride: 102 mmol/L (ref 96–112)
GFR calc Af Amer: 68 mL/min — ABNORMAL LOW (ref >90)
GFR, EST NON AFRICAN AMERICAN: 59 mL/min — AB (ref >90)
Glucose, Bld: 86 mg/dL (ref 70–99)
POTASSIUM: 4.9 mmol/L (ref 3.5–5.1)
Sodium: 134 mmol/L — ABNORMAL LOW (ref 135–145)

## 2014-08-12 LAB — PATHOLOGIST SMEAR REVIEW

## 2014-08-12 LAB — HEMOGLOBIN A1C
Hgb A1c MFr Bld: 5.6 % (ref 4.8–5.6)
MEAN PLASMA GLUCOSE: 114 mg/dL

## 2014-08-12 MED ORDER — BISACODYL 10 MG RE SUPP: 10.0000 mg | Freq: Every day | RECTAL | Status: DC | PRN

## 2014-08-12 MED ORDER — POLYETHYLENE GLYCOL 3350 17 G PO PACK: 17.0000 g | PACK | Freq: Three times a day (TID) | ORAL | Status: DC

## 2014-08-12 MED ORDER — HYDROCORTISONE 2.5 % RE CREA
TOPICAL_CREAM | Freq: Two times a day (BID) | RECTAL | Status: DC | PRN
Start: 2014-08-12 — End: 2014-09-04

## 2014-08-12 MED ORDER — TAMSULOSIN HCL 0.4 MG PO CAPS: 0.4000 mg | ORAL_CAPSULE | Freq: Every day | ORAL | Status: DC

## 2014-08-12 NOTE — Clinical Social Work Note (Signed)
CSW was informed that patient is discharging today. Facility requesting DC Summary and signed FL2. FL2 on chart for MD signature. CSW will assist with DC once DC Summary received.   Liz Beach MSW, [place], [place], [phone]

## 2014-08-12 NOTE — Discharge Summary (Signed)
Physician Discharge Summary  [name] JIR:[id number] DOB: [birth year] DOA: 08/09/2014  PCP: Reymundo Poll, MD  Admit date: 08/09/2014 Discharge date: 08/12/2014  Recommendations for Outpatient Follow-up:  1.  HH PT/OT at [facility] 2.  Increased laxatives/stool softeners to prevent constipation 3.  Follow up with [facility] in 2-4 weeks, please call to schedule appointment  Discharge Diagnoses:  Principal Problem:   Urinary retention Active Problems:   CAD (coronary artery disease), cutting balloon athrectomy of her dominant AV groove of LCX.  2007   OSA on CPAP   DM (diabetes mellitus)   HTN (hypertension)   Constipation   Weakness   Discharge Condition: stable, improved  Diet recommendation: healthy heart  Wt Readings from Last 3 Encounters:  08/12/14 72.1 kg (158 lb 15.2 oz)  03/05/14 72.576 kg (160 lb)  09/06/13 71.215 kg (157 lb)    History of present illness:   [name] a 81 y.o. female with history of mild dementia who lives in an assisted living, CAD, hypertension, dyslipidemia, GERD, OSA  On CPap, type 2 diabetes mellitus, history of paroxysmal atrial fibrillation, [place] Vasc 2 is 6, not on anticoagulation due to advanced age and risk of fall and bleeding, who comes to the hospital with 1 week history of severe constipation.  She also developed inability to pass urine for at least one day prior to admission.  She had abdominal pain and discomfort along with nausea vomiting.  In the ER, she had with urinary retention with over 1 L of fluid in the bladder.  Foley catheter was placed, lab work was unremarkable except for some mild leukopenia which appears to be chronic in nature.  She had some generalized weakness and deconditioning, she was unable to get up and ambulate by herself.  Hospital Course:   Urinary retention. Likely due to constipation she had for the last week. She had 1 L of urine on bladder scan.   - She was started on flomax -  Myrbetriq  was discontinued -  Once constipation resolved, her foley catheter was removed and she was able to void spontaneously - Outpatient follow-up with urology in 2-4 weeks   Severe constipation.  She was started on miralax, bisacodyl, milk of magnesia without results.  She drank nulytely and finally had some bowel movements.  She was pooping clear water by the time of discharge.   -  Stopped calcium supplement - TSH wnl - recommend increased miralax and some stimulant laxatives to prevent constipation    Weakness.  PT/OT to continue to work on strength and endurance at ALF  CAD, history of paroxysmal atrial fibrillation, [place] Vasc 2 is 6, not on A/C due ot falls risk  - no acute issues, EKG is sinus, chest pain free. Continue aspirin, hydralazine and Imdur.  Dyslipidemia. Continue statin.  GERD. Continue PPI.  Diet-controlled type 2 diabetes mellitus, in remission. CBG stable.  - A1c 5.6  Essential hypertension, initially held ARB and diuretic due to dehydration - Resumed losartan during hospitalization -  Continue Norvasc and hydralazine   PCP to please address diuretic again in 1 week.    Mild hyponatremia, may have reset osmostat, mildly improved with IVF and now stable.  Chronic leukopenia. Outpatient age appropriate workup.  Consultants:  none  Procedures:  KUB  Hip Xr  Knee XR  CT abd/pelvis  Antibiotics:  none   Discharge Exam: Filed Vitals:   08/12/14 0658  BP: 141/48  Pulse: 62  Temp: 98 F (36.7 C)  Resp: 18   Filed Vitals:   08/11/14 1420 08/11/14 2240 08/11/14 2347 08/12/14 0658  BP: 152/56 129/42 140/48 141/48  Pulse:  61  62  Temp: 98.7 F (37.1 C) 98.2 F (36.8 C)  98 F (36.7 C)  TempSrc: Oral Oral  Tympanic  Resp: 20 18  18   Height:      Weight:    72.1 kg (158 lb 15.2 oz)  SpO2: 98% 97%  98%     General: Overweight f, No acute distress  HEENT: NCAT, MMM  Cardiovascular: Distant heart sounds, no obvious murmur, 2+  pulses, warm extremities  Respiratory: CTAB, no increased WOB  Abdomen: Hyperactive BS, soft, mild distension, nontender, no rebound or guarding  MSK: Normal tone and bulk, no LEE  Neuro: Grossly moves extremities, no facial droop  Discharge Instructions      Discharge Instructions    Call MD for:  difficulty breathing, headache or visual disturbances    Complete by:  As directed      Call MD for:  extreme fatigue    Complete by:  As directed      Call MD for:  hives    Complete by:  As directed      Call MD for:  persistant dizziness or light-headedness    Complete by:  As directed      Call MD for:  persistant nausea and vomiting    Complete by:  As directed      Call MD for:  severe uncontrolled pain    Complete by:  As directed      Call MD for:  temperature >100.4    Complete by:  As directed      Diet - low sodium heart healthy    Complete by:  As directed      Increase activity slowly    Complete by:  As directed             Medication List    STOP taking these medications        calcium-vitamin D 500-200 MG-UNIT per tablet  Commonly known as:  OSCAL WITH D     cephALEXin 500 MG capsule  Commonly known as:  KEFLEX     metoCLOPramide 5 MG tablet  Commonly known as:  REGLAN     MYRBETRIQ 50 MG Tb24 tablet  Generic drug:  mirabegron ER      TAKE these medications        acetaminophen 500 MG tablet  Commonly known as:  TYLENOL  Take 500 mg by mouth 3 (three) times daily.     amLODipine 10 MG tablet  Commonly known as:  NORVASC  Take 10 mg by mouth daily.     aspirin 81 MG chewable tablet  Chew 81 mg by mouth daily.     bisacodyl 10 MG suppository  Commonly known as:  DULCOLAX  Place 1 suppository (10 mg total) rectally daily as needed (no bowel movement in 24 hours).     cetirizine 5 MG tablet  Commonly known as:  ZYRTEC  Take 5 mg by mouth daily.     cholecalciferol 400 UNITS Tabs tablet  Commonly known as:  VITAMIN D  Take 800  Units by mouth daily.     citalopram 20 MG tablet  Commonly known as:  CELEXA  Take 20 mg by mouth daily before breakfast.     docusate sodium 100 MG capsule  Commonly known as:  COLACE  Take 100 mg by mouth  2 (two) times daily.     esomeprazole 40 MG capsule  Commonly known as:  NEXIUM  Take 40 mg by mouth daily before breakfast.     fluticasone 50 MCG/ACT nasal spray  Commonly known as:  FLONASE  Place 2 sprays into both nostrils daily.     hydrALAZINE 25 MG tablet  Commonly known as:  APRESOLINE  Take 25 mg by mouth 3 (three) times daily.     hydrocortisone 2.5 % rectal cream  Commonly known as:  PROCTOZONE-HC  Place rectally 2 (two) times daily as needed for hemorrhoids or itching (rectal pain).     isosorbide mononitrate 60 MG 24 hr tablet  Commonly known as:  IMDUR  Take 1 tablet (60 mg total) by mouth daily before breakfast.     ketorolac 0.5 % ophthalmic solution  Commonly known as:  ACULAR  Place 1 drop into both eyes daily.     losartan-hydrochlorothiazide 100-25 MG per tablet  Commonly known as:  HYZAAR  Take 1 tablet by mouth daily before breakfast.     lubiprostone 24 MCG capsule  Commonly known as:  AMITIZA  Take 1 capsule (24 mcg total) by mouth 2 (two) times daily with a meal.     nitroGLYCERIN 0.4 MG/SPRAY spray  Commonly known as:  NITROLINGUAL  Place 2 sprays under the tongue every 5 (five) minutes as needed. Use 2 sprays under tongue as needed for chest pain. Repeat 2 sprays if pain persists after 15 minutes if pain still persists     PATADAY 0.2 % Soln  Generic drug:  Olopatadine HCl  Place 1 drop into both eyes daily.     polyethylene glycol packet  Commonly known as:  MIRALAX / GLYCOLAX  Take 17 g by mouth 3 (three) times daily. Mix in 8 oz of suitable liquid and drink by mouth every day     rosuvastatin 5 MG tablet  Commonly known as:  CRESTOR  Take 5 mg by mouth at bedtime.     senna 8.6 MG Tabs tablet  Commonly known as:  SENOKOT   Take 1 tablet by mouth 2 (two) times daily.     spironolactone 25 MG tablet  Commonly known as:  ALDACTONE  Take 25 mg by mouth daily before breakfast.     tamsulosin 0.4 MG Caps capsule  Commonly known as:  FLOMAX  Take 1 capsule (0.4 mg total) by mouth daily.       Follow-up Information    Follow up with Reymundo Poll, MD. Schedule an appointment as soon as possible for a visit in 1 week.   Specialty:  Family Medicine   Contact information:   [address] [place] [address] [phone]       Follow up with [facility]. Schedule an appointment as soon as possible for a visit in 2 weeks.   Contact information:   [address]  [address] [place] [address] [phone]        The results of significant diagnostics from this hospitalization (including imaging, microbiology, ancillary and laboratory) are listed below for reference.    Significant Diagnostic Studies: Dg Knee 2 Views Left  08/09/2014   CLINICAL DATA:  Abdominal pain and weakness. Pain in the left hip and knee. No known injury  EXAM: LEFT KNEE - 1-2 VIEW  COMPARISON:  11/28/2005  FINDINGS: There is no evidence of fracture, dislocation, or joint effusion.  Medial compartment osteoarthritis with moderate marginal spurring.  There is degenerative marginal spurring at the patellofemoral compartment, with joint narrowing, that has progressed from 2007.  Osteopenia.  IMPRESSION: 1. No acute osseous findings. 2. Knee osteoarthritis with mild progression from 2007.   Electronically Signed   By: Monte Fantasia M.D.   On: 08/09/2014 13:15   Ct Abdomen Pelvis W Contrast  08/09/2014   CLINICAL DATA:  Abdominal pain and urinary frequency  EXAM: CT ABDOMEN AND PELVIS WITH CONTRAST  TECHNIQUE: Multidetector CT imaging of the abdomen and pelvis was performed using the standard protocol following bolus administration of intravenous contrast.  CONTRAST:  120mL OMNIPAQUE IOHEXOL 300 MG/ML  SOLN   COMPARISON:  02/28/2013  FINDINGS: Lung bases are free of acute infiltrate or sizable effusion.  The liver, gallbladder, spleen, adrenal glands and pancreas are within normal limits. The kidneys are well visualized and show no evidence of renal calculi or obstructive changes. Renal vascular calcifications are again noted. No obstructive changes are seen.  The bladder is well distended with evidence of a Foley catheter within. Air is noted within the bladder consistent with the recent catheter placement. The appendix is not visualized consistent with a prior surgical history. No pelvic mass lesion is noted. The uterus is been surgically removed. Diffuse diverticular change of the colon is noted. No diverticulitis is seen. Aortoiliac calcifications are noted without aneurysmal dilatation. The osseous structures show show degenerative changes of the hip joints and lumbar spine. No acute bony abnormality is seen.  IMPRESSION: Diverticulosis without diverticulitis.  Status post appendectomy and hysterectomy  No acute abnormality is noted.   Electronically Signed   By: Inez Catalina M.D.   On: 08/09/2014 14:13   Dg Abd Acute W/chest  08/09/2014   CLINICAL DATA:  Nausea and vomiting with abdominal pain  EXAM: ACUTE ABDOMEN SERIES (ABDOMEN 2 VIEW & CHEST 1 VIEW)  COMPARISON:  04/22/2014  FINDINGS: Cardiac shadow is mildly enlarged but stable. The lungs are clear bilaterally. No focal infiltrate is noted. The abdomen show scattered large and small bowel gas. No obstructive changes are. No free air is seen. No acute bony abnormality is noted.  IMPRESSION: No acute abnormality noted.   Electronically Signed   By: Inez Catalina M.D.   On: 08/09/2014 11:48   Dg Hip Unilat With Pelvis 2-3 Views Left  08/09/2014   CLINICAL DATA:  Pain all over the left hip and left knee. No known injury.  EXAM: LEFT HIP (WITH PELVIS) 2-3 VIEWS  COMPARISON:  10/28/2013  FINDINGS: There appears to be oral contrast within loops of bowel. The  pelvic bony ring is intact. Medial joint space narrowing in both hips and similar to the prior examination. Mild cortical irregularity involving the femoral necks bilaterally and unchanged. Left hip is located without a fracture.  IMPRESSION: No acute bone abnormality in the pelvis or left hip.  Osteoarthritic changes in both hips.   Electronically Signed   By: Markus Daft M.D.   On: 08/09/2014 13:16    Microbiology: Recent Results (from the past 240 hour(s))  MRSA PCR Screening     Status: Abnormal   Collection Time: 08/09/14  6:57 PM  Result Value Ref Range Status   MRSA by PCR POSITIVE (A) NEGATIVE Final    Comment:        The GeneXpert MRSA Assay (FDA approved for NASAL specimens only), is one component of a comprehensive MRSA colonization surveillance program. It is not intended to diagnose MRSA infection nor to guide or monitor treatment for MRSA  infections. RESULT CALLED TO, READ BACK BY AND VERIFIED WITH: T.BULTER,RN AT 0121 BY L.PITT 08/10/14      Labs: Basic Metabolic Panel:  Recent Labs Lab 08/09/14 1010 08/10/14 0620 08/11/14 0530 08/12/14 0625  NA 131* 132* 132* 134*  K 3.9 4.0 4.8 4.9  CL 101 100 98 102  CO2 23 25 26 28   GLUCOSE 105* 87 92 86  BUN 6 8 14 16   CREATININE 0.75 0.96 1.05 0.87  CALCIUM 9.6 8.9 9.6 9.4   Liver Function Tests:  Recent Labs Lab 08/09/14 1010  AST 23  ALT 16  ALKPHOS 42  BILITOT 0.8  PROT 7.3  ALBUMIN 3.9    Recent Labs Lab 08/09/14 1010  LIPASE 49   No results for input(s): AMMONIA in the last 168 hours. CBC:  Recent Labs Lab 08/09/14 1010 08/10/14 0620 08/11/14 0530 08/12/14 0625  WBC 2.3* 3.8* 4.2 6.2  NEUTROABS 0.9*  --   --   --   HGB 11.9* 11.4* 11.2* 11.1*  HCT 35.4* 34.4* 34.3* 34.0*  MCV 87.0 87.8 89.6 88.8  PLT 244 213 213 229   Cardiac Enzymes:  Recent Labs Lab 08/09/14 1010  TROPONINI <0.03   BNP: BNP (last 3 results) No results for input(s): BNP in the last 8760 hours.  ProBNP  (last 3 results)  Recent Labs  04/22/14 1939  PROBNP 103.1    CBG:  Recent Labs Lab 08/11/14 1219 08/11/14 1737 08/11/14 2245 08/12/14 0841 08/12/14 1215  GLUCAP 144* 167* 112* 100* 133*    Time coordinating discharge: 35 minutes  Signed:  Shaneeka Scarboro  [facility] 08/12/2014, 2:08 PM

## 2014-08-12 NOTE — Progress Notes (Signed)
Medicare Important Message given?  YES (If response is "NO", the following Medicare IM given date fields will be blank) Date Medicare IM given:  08/12/14 Medicare IM given by:  Olyn Landstrom 

## 2014-08-12 NOTE — Clinical Social Work Note (Signed)
Per MD patient ready to DC back to [facility]. RN, patient/family (Patient's sister Hoyle Sauer called), and facility notified of patient's DC. RN given number for report. DC packet on patient's chart. Ambulance transport requested for patient. CSW signing off at this time.   Liz Beach MSW, [place], [place], [phone]

## 2014-08-12 NOTE — Progress Notes (Signed)
Patient has voided post foley removal. Now ready for discharge. Dr Sheran Fava aware. Joslyn Hy, MSN, RN, [employer]

## 2014-08-12 NOTE — Progress Notes (Signed)
PT Cancellation Note  Patient Details Name: [name] MRN: [id number] DOB: [birth year]   Cancelled Treatment:    Reason Eval/Treat Not Completed: Other (comment) (pt getting ready to discharge to [facility], will defer treat) 08/12/2014  Donnella Sham, [place] [phone]  (pager)  Dravyn Severs, Tessie Fass 08/12/2014, 5:27 PM

## 2014-08-12 NOTE — Progress Notes (Signed)
Pt refuses CPAP 

## 2014-08-12 NOTE — Clinical Social Work Note (Signed)
Clinical Social Work Department BRIEF PSYCHOSOCIAL ASSESSMENT 08/12/2014  Patient:  [name],[name]     Account Number:  [id number]     Admit date:  08/09/2014  Clinical Social Worker:  Myles Lipps  Date/Time:  08/12/2014 09:30 AM  Referred by:  RN  Date Referred:  08/12/2014 Referred for  ALF Placement   Other Referral:   Interview type:  Patient Other interview type:   Spoke with patient sister Colin Ina) over the phone with patient permission to confirm plans    PSYCHOSOCIAL DATA Living Status:  FACILITY Admitted from facility:  [facility] Level of care:  Assisted Living Primary support name:  Colin Ina [phone] Primary support relationship to patient:  SIBLING Degree of support available:   Strong    CURRENT CONCERNS Current Concerns  Post-Acute Placement   Other Concerns:    SOCIAL WORK ASSESSMENT / PLAN Clinical Social Worker met with patient at bedside to offer support and discuss patient needs at discharge.  Patient states that she has been living at [facility] and would like to return once medically ready.  Patient requested that CSW follow up with her sister to confirm plans.  CSW spoke with patient sister over the phone who states that patient has been living at [facility] for a little over a year and she will definitely be returning at discharge.  CSW to complete FL2 and contact the facility regarding patient return.  CSW to contact patient sister again once patient is medically ready for discharge.  CSW remains available for support and to facilitate patient discharge needs once medically ready.   Assessment/plan status:  Psychosocial Support/Ongoing Assessment of Needs Other assessment/ plan:   Information/referral to community resources:   Holiday representative offered patient additional choices for placement, however [facility] is now patient "home."    PATIENT'S/FAMILY'S RESPONSE TO PLAN OF CARE: Patient alert and oriented  x3 laying in bed and complaining of some pain issues.  Patient states that she would like to return to [facility] but requested RN to provide medication for pain and constipation - RN notified. Patient with good family support who are aware and agreeable with patient current discharge plans.  Patient and family verbalized their appreciation for CSW support and concern.

## 2014-08-13 NOTE — Care Management Note (Addendum)
    Page 1 of 1   08/13/2014     1:36:52 PM CARE MANAGEMENT NOTE 08/13/2014  Patient:  [name],[name]   Account Number:  [id number]  Date Initiated:  08/12/2014  Documentation initiated by:  Tomi Bamberger  Subjective/Objective Assessment:   dx weakness  admit as observation- from [facility]     Action/Plan:   pt eval- hhpt   Anticipated DC Date:  08/12/2014   Anticipated DC Plan:  [place]  CM consult      [facility] Choice  HOME HEALTH   Choice offered to / List presented to:          [place] arranged  HH-2 PT      [place] agency  OTHER - SEE NOTE   Status of service:  Completed, signed off Medicare Important Message given?  YES (If response is "NO", the following Medicare IM given date fields will be blank) Date Medicare IM given:  08/12/2014 Medicare IM given by:  Tomi Bamberger Date Additional Medicare IM given:   Additional Medicare IM given by:    Discharge Disposition:  [place]  Per UR Regulation:  Reviewed for med. necessity/level of care/duration of stay  If discussed at [place] of Stay Meetings, dates discussed:    Comments:  08/13/14 [place] ,BSN [id number] patient dc [facility] , NCM will call to see if they have their own hhpt services there.  NCM spoke with Ms Tamala Julian she state they use [place] or [place] and the hhpt was on the fl2 so they set patient up with [place].

## 2014-09-04 ENCOUNTER — Encounter: Payer: Self-pay | Admitting: Cardiology

## 2014-09-04 ENCOUNTER — Ambulatory Visit (INDEPENDENT_AMBULATORY_CARE_PROVIDER_SITE_OTHER): Payer: Medicare Other | Admitting: Cardiology

## 2014-09-04 VITALS — BP 102/52 | HR 72 | Ht 65.0 in | Wt 155.3 lb

## 2014-09-04 DIAGNOSIS — R0789 Other chest pain: Secondary | ICD-10-CM | POA: Diagnosis not present

## 2014-09-04 NOTE — Progress Notes (Signed)
Cardiology Office Note   Date:  09/04/2014   ID:  [name], DOB 1926/12/25, MRN [id number]  PCP:  Reymundo Poll, MD  Cardiologist:  Dr. Adora Fridge     Chief Complaint  Patient presents with  . Coronary Artery Disease    6 months. has chest pain on ocasion with left arm pain. dyspnea with activity.      History of Present Illness: [name] is a 81 y.o. female who presents for monitoring of her CAD , history of paroxysmal atrial fibrillation, [place] Vasc 2 is 6, not on A/C due to falls risk.     She has a history of hypertension and bradycardia. She also has known CAD, status post cutting balloon atherectomy of an AV groove circumflex in 2007 by me with normal LV function. She does have some dementia as well as GERD but otherwise is asymptomatic. 2014 Myoview stress test was normal. On further questioning her pains seems to occur after eating vegetables suggesting a gastrointestinal etiology.  Lipid profile performed 09/27/12  LDL 29 and HDL of 39.  Recently hospitalized with constipation and urinary retention and it was thought the urinary retention was secondary to the constipation. Physical therapy and occupational therapy worked with her in the hospital. Today she walks in with a walker and feels fairly well though she complains of some chest pressure additionally on her aide with her states she is more dyspneic on exertion this is fairly new for her. Her diabetes has been fairly well controlled she is on a statin and her hypertension has been controlled.  Her blood pressure is 102/52 borderline.   Past Medical History  Diagnosis Date  . Coronary artery disease   . Dementia   . Hypertension   . Hyperlipidemia   . Anxiety   . GERD (gastroesophageal reflux disease)   . CHF (congestive heart failure)   . Polyp, stomach 11/11/2006  . Aphakia of left eye   . Obese   . MI (myocardial infarction)   . Chest pain at rest 12/20/2011  . CAD (coronary artery disease), cutting balloon  athrectomy of her dominant AV groove of LCX.  2007 12/20/2011  . Bradycardia 12/20/2011  . HTN (hypertension) 12/20/2011  . Dyslipidemia  12/20/2011  . Cardiomyopathy   . History of GI bleed   . Atrial fibrillation   . Sigmoid diverticulosis   . Anemia, iron deficiency 10/24/2012  . Constipation due to pain medication 10/24/2012  . Cataract     "coming back in both eyes" (06/19/2013)  . Chronic bronchitis     "I have it all year round"  . Shortness of breath     "comes on at anytime" (06/19/2013)  . OSA on CPAP 12/20/2011  . Chronic back pain     "all over my back"   . Breast cancer, right breast   . Type II diabetes mellitus   . Headache     "right often" (08/09/2014)  . DJD (degenerative joint disease)   . Osteoarthritis   . Arthritis     "all over my body"  . Nephrolithiasis     bilateral    Past Surgical History  Procedure Laterality Date  . Back surgery    . Appendectomy    . Fracture surgery    . Dilation and curettage of uterus    . Esophagogastroduodenoscopy    . Colonoscopy    . Eus    . Esophagogastroduodenoscopy  02/21/2012    Procedure: ESOPHAGOGASTRODUODENOSCOPY (EGD);  Surgeon: Glendell Docker  Simonne Maffucci, MD;  Location: [facility];  Service: Endoscopy;  Laterality: N/A;  pt. being evaluated for a pacemaker  . Balloon dilation  02/21/2012    Procedure: BALLOON DILATION;  Surgeon: Gatha Mayer, MD;  Location: [facility];  Service: Endoscopy;  Laterality: N/A;  . Colonoscopy N/A 10/24/2012    Procedure: COLONOSCOPY;  Surgeon: Gatha Mayer, MD;  Location: [facility];  Service: Endoscopy;  Laterality: N/A;  . US echocardiography  01/25/2008    mild DUST,mild MR,mod. ca+ mitral annular,AOV mildly sclerotid  . Lexiscan mycardial perfusion scan  01/25/2008    Normal  . Inguinal hernia repair Right 06/18/2013  . Tonsillectomy    . Total abdominal hysterectomy    . Cataract extraction w/ intraocular lens  implant, bilateral Bilateral   . Breast biopsy Right   . Breast  lumpectomy Right   . Inguinal hernia repair Right 06/18/2013    Procedure: HERNIA REPAIR INGUINAL ADULT;  Surgeon: Gwenyth Ober, MD;  Location: [facility];  Service: General;  Laterality: Right;  . Insertion of mesh Right 06/18/2013    Procedure: INSERTION OF MESH;  Surgeon: Gwenyth Ober, MD;  Location: [facility];  Service: General;  Laterality: Right;  . Left heart catheterization with coronary angiogram N/A 12/21/2011    Procedure: LEFT HEART CATHETERIZATION WITH CORONARY ANGIOGRAM;  Surgeon: Lorretta Harp, MD;  Location: [facility] CATH LAB;  Service: Cardiovascular;  Laterality: N/A;  . Glaucoma surgery Bilateral   . Eye surgery    . Coronary angioplasty with stent placement      "Dr. Alvester Chou put it in; it's been in a long time"  . Coronary angioplasty  03-07-2004    cutting balloon  . Cardiac catheterization  06/05/2004    high grade disease AV groove CX  . Cardiac catheterization  04/19/2005    noncritical CAD     Current Outpatient Prescriptions  Medication Sig Dispense Refill  . acetaminophen (TYLENOL) 500 MG tablet Take 500 mg by mouth 3 (three) times daily.    Marland Kitchen amLODipine (NORVASC) 10 MG tablet Take 10 mg by mouth daily.    Marland Kitchen aspirin 81 MG chewable tablet Chew 81 mg by mouth daily.    . bisacodyl (DULCOLAX) 10 MG suppository Place 1 suppository (10 mg total) rectally daily as needed (no bowel movement in 24 hours). 12 suppository 0  . cetirizine (ZYRTEC) 5 MG tablet Take 5 mg by mouth daily.    . cholecalciferol (VITAMIN D) 400 UNITS TABS tablet Take 800 Units by mouth daily.    . citalopram (CELEXA) 20 MG tablet Take 20 mg by mouth daily before breakfast.     . docusate sodium (COLACE) 100 MG capsule Take 100 mg by mouth 2 (two) times daily.    Marland Kitchen esomeprazole (NEXIUM) 40 MG capsule Take 40 mg by mouth daily before breakfast.    . fluticasone (FLONASE) 50 MCG/ACT nasal spray Place 2 sprays into both nostrils daily.     . hydrALAZINE (APRESOLINE) 25 MG tablet Take 25 mg by mouth 3 (three)  times daily.    . isosorbide mononitrate (IMDUR) 60 MG 24 hr tablet Take 1 tablet (60 mg total) by mouth daily before breakfast. 30 tablet 6  . ketorolac (ACULAR) 0.5 % ophthalmic solution Place 1 drop into both eyes daily.    Marland Kitchen losartan-hydrochlorothiazide (HYZAAR) 100-25 MG per tablet Take 1 tablet by mouth daily before breakfast.     . lubiprostone (AMITIZA) 24 MCG capsule Take 1 capsule (24 mcg total) by  mouth 2 (two) times daily with a meal. 60 capsule 11  . nitroGLYCERIN (NITROLINGUAL) 0.4 MG/SPRAY spray Place 2 sprays under the tongue every 5 (five) minutes as needed. Use 2 sprays under tongue as needed for chest pain. Repeat 2 sprays if pain persists after 15 minutes if pain still persists    . Olopatadine HCl (PATADAY) 0.2 % SOLN Place 1 drop into both eyes daily.     . polyethylene glycol (MIRALAX / GLYCOLAX) packet Take 17 g by mouth 3 (three) times daily. Mix in 8 oz of suitable liquid and drink by mouth every day 14 each 0  . rosuvastatin (CRESTOR) 5 MG tablet Take 5 mg by mouth at bedtime.     . senna (SENOKOT) 8.6 MG TABS Take 1 tablet by mouth 2 (two) times daily.    Marland Kitchen spironolactone (ALDACTONE) 25 MG tablet Take 25 mg by mouth daily before breakfast.     . tamsulosin (FLOMAX) 0.4 MG CAPS capsule Take 1 capsule (0.4 mg total) by mouth daily. 30 capsule 0   No current facility-administered medications for this visit.    Allergies:   Chicken allergy; Fish allergy; and Percocet    Social History:  The patient  reports that she has never smoked. She has never used smokeless tobacco. She reports that she does not drink alcohol or use illicit drugs.   Family History:  The patient's family history includes Colon cancer in her mother; Heart disease in her mother; Stroke in her mother.    ROS:  General:no colds or fevers, no weight changes Skin:no rashes or ulcers HEENT:no blurred vision, no congestion CV:see HPI PUL:see HPI GI:no diarrhea constipation or melena, no  indigestion GU:no hematuria, no dysuria MS:no joint pain, no claudication Neuro:no syncope, no lightheadedness Endo:no diabetes, no thyroid disease  Wt Readings from Last 3 Encounters:  09/04/14 155 lb 4.8 oz (70.444 kg)  08/12/14 158 lb 15.2 oz (72.1 kg)  03/05/14 160 lb (72.576 kg)     PHYSICAL EXAM: VS:  BP 102/52 mmHg  Pulse 72  Ht 5\' 5"  (1.651 m)  Wt 155 lb 4.8 oz (70.444 kg)  BMI 25.84 kg/m2 , BMI Body mass index is 25.84 kg/(m^2). General:Pleasant affect, NAD Skin:Warm and dry, brisk capillary refill HEENT:normocephalic, sclera clear, mucus membranes moist Neck:supple, no JVD, no bruits  Heart:S1S2 RRR without murmur, gallup, rub or click Lungs:clear without rales, rhonchi, or wheezes HKV:QQVZ, non tender, + BS, do not palpate liver spleen or masses Ext:no lower ext edema, 2+ pedal pulses, 2+ radial pulses Neuro:alert and oriented, MAE, follows commands, + facial symmetry    EKG:  EKG is ordered today. The ekg ordered today demonstrates SR rate 72 occ PAC, non specific t wave abnormalities but no acute changes.    Recent Labs: 04/22/2014: Pro B Natriuretic peptide (BNP) 103.1 08/09/2014: ALT 16; TSH 0.577 08/12/2014: BUN 16; Creatinine 0.87; Hemoglobin 11.1*; Platelets 229; Potassium 4.9; Sodium 134*    Lipid Panel    Component Value Date/Time   CHOL  11/29/2006 0025    126        ATP III CLASSIFICATION:  <200     mg/dL   Desirable  200-239  mg/dL   Borderline High  >=240    mg/dL   High   TRIG 110 11/29/2006 0025   HDL 40 11/29/2006 0025   CHOLHDL 3.2 11/29/2006 0025   VLDL 22 11/29/2006 0025   LDLCALC  11/29/2006 0025    64  Total Cholesterol/HDL:CHD Risk Coronary Heart Disease Risk Table                     Men   Women  1/2 Average Risk   3.4   3.3       Other studies Reviewed: Additional studies/ records that were reviewed today include: cath 2007, echo, nuc.hospitalization   ASSESSMENT AND PLAN:  1. Angina- lexiscan myoview. We will  hold off vein Ranexa for now in weight in until we know if she truly has angina. We will call the results of the test and have her follow up with either Dr. Gwenlyn Found or an extender in the office when he is present.  Patient and her sitter were agreeable to this plan.  We will also check a basic metabolic panel.  2.  CAD (coronary artery disease), cutting balloon athrectomy of her dominant AV groove of LCX. 2007  Her other arteries were without significant disease and her heart function was normal. She did have a Myoview stress test 11/30/12 which is nonischemic because of atypical chest pain.  3.  HTN (hypertension) Controlled on current medications  4.  Dyslipidemia  On statin therapy followed by her PCP     Current medicines are reviewed with the patient today.  The patient Has no concerns regarding medicines.  The following changes have been made:  See above Labs/ tests ordered today include:see above  Disposition:   FU:  see above  Signed, Isaiah Serge, NP  09/04/2014 9:57 AM    [facility] [place], [place], [place], [place] Phone: [phone]; Fax: [phone]

## 2014-09-04 NOTE — Patient Instructions (Signed)
Your physician has requested that you have a lexiscan myoview. For further information please visit [email or web address]. Please follow instruction sheet, as given.  Your physician recommends that you schedule a follow-up appointment in a few weeks after your test with Dr.Berry or an Extender on a day that Dr.Berry is in the office.

## 2014-09-06 ENCOUNTER — Encounter (HOSPITAL_COMMUNITY): Payer: Self-pay | Admitting: *Deleted

## 2014-09-13 ENCOUNTER — Emergency Department (HOSPITAL_COMMUNITY)
Admission: EM | Admit: 2014-09-13 | Discharge: 2014-09-14 | Disposition: A | Discharge status: Home / Self Care | Payer: Medicare Other | Attending: Emergency Medicine | Admitting: Emergency Medicine

## 2014-09-13 ENCOUNTER — Encounter (HOSPITAL_COMMUNITY): Payer: Self-pay | Admitting: Emergency Medicine

## 2014-09-13 ENCOUNTER — Emergency Department (HOSPITAL_COMMUNITY): Payer: Medicare Other

## 2014-09-13 DIAGNOSIS — Y9389 Activity, other specified: Secondary | ICD-10-CM | POA: Insufficient documentation

## 2014-09-13 DIAGNOSIS — Z9981 Dependence on supplemental oxygen: Secondary | ICD-10-CM | POA: Diagnosis not present

## 2014-09-13 DIAGNOSIS — I252 Old myocardial infarction: Secondary | ICD-10-CM | POA: Insufficient documentation

## 2014-09-13 DIAGNOSIS — K219 Gastro-esophageal reflux disease without esophagitis: Secondary | ICD-10-CM | POA: Diagnosis not present

## 2014-09-13 DIAGNOSIS — I2510 Atherosclerotic heart disease of native coronary artery without angina pectoris: Secondary | ICD-10-CM | POA: Insufficient documentation

## 2014-09-13 DIAGNOSIS — S0083XA Contusion of other part of head, initial encounter: Secondary | ICD-10-CM | POA: Diagnosis not present

## 2014-09-13 DIAGNOSIS — E785 Hyperlipidemia, unspecified: Secondary | ICD-10-CM | POA: Insufficient documentation

## 2014-09-13 DIAGNOSIS — N390 Urinary tract infection, site not specified: Secondary | ICD-10-CM | POA: Diagnosis not present

## 2014-09-13 DIAGNOSIS — E669 Obesity, unspecified: Secondary | ICD-10-CM | POA: Insufficient documentation

## 2014-09-13 DIAGNOSIS — S0990XA Unspecified injury of head, initial encounter: Secondary | ICD-10-CM | POA: Diagnosis present

## 2014-09-13 DIAGNOSIS — K5900 Constipation, unspecified: Secondary | ICD-10-CM | POA: Insufficient documentation

## 2014-09-13 DIAGNOSIS — Z862 Personal history of diseases of the blood and blood-forming organs and certain disorders involving the immune mechanism: Secondary | ICD-10-CM | POA: Diagnosis not present

## 2014-09-13 DIAGNOSIS — E876 Hypokalemia: Secondary | ICD-10-CM | POA: Diagnosis not present

## 2014-09-13 DIAGNOSIS — W01198A Fall on same level from slipping, tripping and stumbling with subsequent striking against other object, initial encounter: Secondary | ICD-10-CM | POA: Diagnosis not present

## 2014-09-13 DIAGNOSIS — Y998 Other external cause status: Secondary | ICD-10-CM | POA: Diagnosis not present

## 2014-09-13 DIAGNOSIS — Z79899 Other long term (current) drug therapy: Secondary | ICD-10-CM | POA: Diagnosis not present

## 2014-09-13 DIAGNOSIS — M1990 Unspecified osteoarthritis, unspecified site: Secondary | ICD-10-CM | POA: Diagnosis not present

## 2014-09-13 DIAGNOSIS — Z87442 Personal history of urinary calculi: Secondary | ICD-10-CM | POA: Diagnosis not present

## 2014-09-13 DIAGNOSIS — I509 Heart failure, unspecified: Secondary | ICD-10-CM | POA: Diagnosis not present

## 2014-09-13 DIAGNOSIS — E119 Type 2 diabetes mellitus without complications: Secondary | ICD-10-CM | POA: Diagnosis not present

## 2014-09-13 DIAGNOSIS — I10 Essential (primary) hypertension: Secondary | ICD-10-CM | POA: Diagnosis not present

## 2014-09-13 DIAGNOSIS — Z7982 Long term (current) use of aspirin: Secondary | ICD-10-CM | POA: Diagnosis not present

## 2014-09-13 DIAGNOSIS — F0390 Unspecified dementia without behavioral disturbance: Secondary | ICD-10-CM | POA: Insufficient documentation

## 2014-09-13 DIAGNOSIS — Y929 Unspecified place or not applicable: Secondary | ICD-10-CM | POA: Diagnosis not present

## 2014-09-13 DIAGNOSIS — G8929 Other chronic pain: Secondary | ICD-10-CM | POA: Diagnosis not present

## 2014-09-13 DIAGNOSIS — Z7951 Long term (current) use of inhaled steroids: Secondary | ICD-10-CM | POA: Diagnosis not present

## 2014-09-13 DIAGNOSIS — G4733 Obstructive sleep apnea (adult) (pediatric): Secondary | ICD-10-CM | POA: Insufficient documentation

## 2014-09-13 DIAGNOSIS — I4891 Unspecified atrial fibrillation: Secondary | ICD-10-CM | POA: Diagnosis not present

## 2014-09-13 DIAGNOSIS — Z853 Personal history of malignant neoplasm of breast: Secondary | ICD-10-CM | POA: Insufficient documentation

## 2014-09-13 DIAGNOSIS — F419 Anxiety disorder, unspecified: Secondary | ICD-10-CM | POA: Insufficient documentation

## 2014-09-13 DIAGNOSIS — W19XXXA Unspecified fall, initial encounter: Secondary | ICD-10-CM

## 2014-09-13 DIAGNOSIS — S199XXA Unspecified injury of neck, initial encounter: Secondary | ICD-10-CM | POA: Insufficient documentation

## 2014-09-13 LAB — CBC WITH DIFFERENTIAL/PLATELET
Basophils Absolute: 0 10*3/uL (ref 0.0–0.1)
Basophils Relative: 1 % (ref 0–1)
Eosinophils Absolute: 0.1 10*3/uL (ref 0.0–0.7)
Eosinophils Relative: 2 % (ref 0–5)
HEMATOCRIT: 35.5 % — AB (ref 36.0–46.0)
HEMOGLOBIN: 11.8 g/dL — AB (ref 12.0–15.0)
LYMPHS ABS: 1.1 10*3/uL (ref 0.7–4.0)
Lymphocytes Relative: 26 % (ref 12–46)
MCH: 29.4 pg (ref 26.0–34.0)
MCHC: 33.2 g/dL (ref 30.0–36.0)
MCV: 88.5 fL (ref 78.0–100.0)
MONOS PCT: 15 % — AB (ref 3–12)
Monocytes Absolute: 0.6 10*3/uL (ref 0.1–1.0)
Neutro Abs: 2.5 10*3/uL (ref 1.7–7.7)
Neutrophils Relative %: 56 % (ref 43–77)
Platelets: 222 10*3/uL (ref 150–400)
RBC: 4.01 MIL/uL (ref 3.87–5.11)
RDW: 13.7 % (ref 11.5–15.5)
WBC: 4.3 10*3/uL (ref 4.0–10.5)

## 2014-09-13 LAB — BASIC METABOLIC PANEL
ANION GAP: 10 (ref 5–15)
BUN: 8 mg/dL (ref 6–23)
CHLORIDE: 104 mmol/L (ref 96–112)
CO2: 22 mmol/L (ref 19–32)
CREATININE: 0.78 mg/dL (ref 0.50–1.10)
Calcium: 9.3 mg/dL (ref 8.4–10.5)
GFR, EST AFRICAN AMERICAN: 86 mL/min — AB (ref >90)
GFR, EST NON AFRICAN AMERICAN: 74 mL/min — AB (ref >90)
Glucose, Bld: 95 mg/dL (ref 70–99)
POTASSIUM: 3.2 mmol/L — AB (ref 3.5–5.1)
Sodium: 136 mmol/L (ref 135–145)

## 2014-09-13 LAB — I-STAT TROPONIN, ED: Troponin i, poc: 0.01 ng/mL (ref 0.00–0.08)

## 2014-09-13 NOTE — ED Notes (Signed)
Per EMS, pt fell about an hour ago. EMS states that the pt had gotten up to get a pair of socks, but lost her footing and fell and hit her head on her room mate's bed. Pt takes ASA, but no other blood thinner. Pt has has mild swelling and bruising to the left forehead. Pt states that she normally walk with a walker.

## 2014-09-13 NOTE — ED Provider Notes (Signed)
CSN: [id number]     Arrival date & time 09/13/14  2141 History   First MD Initiated Contact with Patient 09/13/14 2142     Chief Complaint  Patient presents with  . Fall     (Consider location/radiation/quality/duration/timing/severity/associated sxs/prior Treatment) Patient is a 81 y.o. female presenting with fall. The history is provided by the patient and medical records.  Fall   LEVEL 5 CAVEAT:  DEMENTIA This is an 81 year old female with past medical history significant for hypertension, hyperlipidemia, coronary artery disease, dementia, congestive heart failure, presenting to the ED for a fall. Patient apparently got out of bed to go get a pair of socks without using her walker, lost her footing and fell hitting her head on her roommate's bed. Patient denies loss of consciousness. She states she has a headache at this time. She is on daily aspirin, no other anticoagulation.  Patient denies dizziness or syncope prior to fall.  Patient does not have hx of recurrent falls but does have hx of UTI.  No reported fever, chills, or other illness recently.  VSS.  Past Medical History  Diagnosis Date  . Coronary artery disease   . Dementia   . Hypertension   . Hyperlipidemia   . Anxiety   . GERD (gastroesophageal reflux disease)   . CHF (congestive heart failure)   . Polyp, stomach 11/11/2006  . Aphakia of left eye   . Obese   . MI (myocardial infarction)   . Chest pain at rest 12/20/2011  . CAD (coronary artery disease), cutting balloon athrectomy of her dominant AV groove of LCX.  2007 12/20/2011  . Bradycardia 12/20/2011  . HTN (hypertension) 12/20/2011  . Dyslipidemia  12/20/2011  . Cardiomyopathy   . History of GI bleed   . Atrial fibrillation   . Sigmoid diverticulosis   . Anemia, iron deficiency 10/24/2012  . Constipation due to pain medication 10/24/2012  . Cataract     "coming back in both eyes" (06/19/2013)  . Chronic bronchitis     "I have it all year round"  . Shortness of  breath     "comes on at anytime" (06/19/2013)  . OSA on CPAP 12/20/2011  . Chronic back pain     "all over my back"   . Breast cancer, right breast   . Type II diabetes mellitus   . Headache     "right often" (08/09/2014)  . DJD (degenerative joint disease)   . Osteoarthritis   . Arthritis     "all over my body"  . Nephrolithiasis     bilateral   Past Surgical History  Procedure Laterality Date  . Back surgery    . Appendectomy    . Fracture surgery    . Dilation and curettage of uterus    . Esophagogastroduodenoscopy    . Colonoscopy    . Eus    . Esophagogastroduodenoscopy  02/21/2012    Procedure: ESOPHAGOGASTRODUODENOSCOPY (EGD);  Surgeon: Gatha Mayer, MD;  Location: [facility];  Service: Endoscopy;  Laterality: N/A;  pt. being evaluated for a pacemaker  . Balloon dilation  02/21/2012    Procedure: BALLOON DILATION;  Surgeon: Gatha Mayer, MD;  Location: [facility];  Service: Endoscopy;  Laterality: N/A;  . Colonoscopy N/A 10/24/2012    Procedure: COLONOSCOPY;  Surgeon: Gatha Mayer, MD;  Location: [facility];  Service: Endoscopy;  Laterality: N/A;  . US echocardiography  01/25/2008    mild DUST,mild MR,mod. ca+ mitral annular,AOV mildly sclerotid  . Lexiscan  mycardial perfusion scan  01/25/2008    Normal  . Inguinal hernia repair Right 06/18/2013  . Tonsillectomy    . Total abdominal hysterectomy    . Cataract extraction w/ intraocular lens  implant, bilateral Bilateral   . Breast biopsy Right   . Breast lumpectomy Right   . Inguinal hernia repair Right 06/18/2013    Procedure: HERNIA REPAIR INGUINAL ADULT;  Surgeon: Gwenyth Ober, MD;  Location: [facility];  Service: General;  Laterality: Right;  . Insertion of mesh Right 06/18/2013    Procedure: INSERTION OF MESH;  Surgeon: Gwenyth Ober, MD;  Location: [facility];  Service: General;  Laterality: Right;  . Left heart catheterization with coronary angiogram N/A 12/21/2011    Procedure: LEFT HEART CATHETERIZATION WITH  CORONARY ANGIOGRAM;  Surgeon: Lorretta Harp, MD;  Location: [facility] CATH LAB;  Service: Cardiovascular;  Laterality: N/A;  . Glaucoma surgery Bilateral   . Eye surgery    . Coronary angioplasty with stent placement      "Dr. Alvester Chou put it in; it's been in a long time"  . Coronary angioplasty  03-07-2004    cutting balloon  . Cardiac catheterization  06/05/2004    high grade disease AV groove CX  . Cardiac catheterization  04/19/2005    noncritical CAD   Family History  Problem Relation Age of Onset  . Colon cancer Mother   . Heart disease Mother   . Stroke Mother    History  Substance Use Topics  . Smoking status: Never Smoker   . Smokeless tobacco: Never Used  . Alcohol Use: No   OB History    No data available     Review of Systems  Unable to perform ROS: Dementia      Allergies  Chicken allergy; Fish allergy; and Percocet  Home Medications   Prior to Admission medications   Medication Sig Start Date End Date Taking? Authorizing Provider  acetaminophen (TYLENOL) 500 MG tablet Take 500 mg by mouth 3 (three) times daily.   Yes Historical Provider, MD  amLODipine (NORVASC) 10 MG tablet Take 10 mg by mouth daily.   Yes Historical Provider, MD  aspirin 81 MG chewable tablet Chew 81 mg by mouth daily.   Yes Historical Provider, MD  cetirizine (ZYRTEC) 5 MG tablet Take 5 mg by mouth daily.   Yes Historical Provider, MD  cholecalciferol (VITAMIN D) 400 UNITS TABS tablet Take 800 Units by mouth daily.   Yes Historical Provider, MD  citalopram (CELEXA) 20 MG tablet Take 20 mg by mouth daily before breakfast.    Yes Historical Provider, MD  docusate sodium (COLACE) 100 MG capsule Take 100 mg by mouth 2 (two) times daily.   Yes Historical Provider, MD  esomeprazole (NEXIUM) 40 MG capsule Take 40 mg by mouth daily before breakfast.   Yes Historical Provider, MD  fluticasone (FLONASE) 50 MCG/ACT nasal spray Place 2 sprays into both nostrils daily.    Yes Historical Provider, MD   hydrALAZINE (APRESOLINE) 25 MG tablet Take 25 mg by mouth 3 (three) times daily.   Yes Historical Provider, MD  isosorbide mononitrate (IMDUR) 60 MG 24 hr tablet Take 1 tablet (60 mg total) by mouth daily before breakfast. 11/16/12  Yes Isaiah Serge, NP  ketorolac (ACULAR) 0.5 % ophthalmic solution Place 1 drop into both eyes daily.   Yes Historical Provider, MD  losartan-hydrochlorothiazide (HYZAAR) 100-25 MG per tablet Take 1 tablet by mouth daily before breakfast.    Yes Historical Provider,  MD  lubiprostone (AMITIZA) 24 MCG capsule Take 1 capsule (24 mcg total) by mouth 2 (two) times daily with a meal. 10/24/12  Yes Gatha Mayer, MD  mirabegron ER (MYRBETRIQ) 25 MG TB24 tablet Take 25 mg by mouth daily.   Yes Historical Provider, MD  nitroGLYCERIN (NITROLINGUAL) 0.4 MG/SPRAY spray Place 2 sprays under the tongue every 5 (five) minutes as needed. Use 2 sprays under tongue as needed for chest pain. Repeat 2 sprays if pain persists after 15 minutes if pain still persists   Yes Historical Provider, MD  Olopatadine HCl (PATADAY) 0.2 % SOLN Place 1 drop into both eyes daily.    Yes Historical Provider, MD  polyethylene glycol (MIRALAX / GLYCOLAX) packet Take 17 g by mouth 3 (three) times daily. Mix in 8 oz of suitable liquid and drink by mouth every day Patient taking differently: Take 17 g by mouth daily. Mix in 8 oz of suitable liquid and drink by mouth every day 08/12/14  Yes Janece Canterbury, MD  PROCTOZONE-HC 2.5 % rectal cream Apply 1 application topically as needed. 08/15/14  Yes Historical Provider, MD  rosuvastatin (CRESTOR) 5 MG tablet Take 5 mg by mouth at bedtime.    Yes Historical Provider, MD  senna (SENOKOT) 8.6 MG TABS Take 1 tablet by mouth 2 (two) times daily.   Yes Historical Provider, MD  spironolactone (ALDACTONE) 25 MG tablet Take 25 mg by mouth daily before breakfast.    Yes Historical Provider, MD  tamsulosin (FLOMAX) 0.4 MG CAPS capsule Take 1 capsule (0.4 mg total) by mouth  daily. 08/12/14  Yes Janece Canterbury, MD  bisacodyl (DULCOLAX) 10 MG suppository Place 1 suppository (10 mg total) rectally daily as needed (no bowel movement in 24 hours). 08/12/14   Janece Canterbury, MD   BP 161/49 mmHg  Pulse 68  Temp(Src) 98.2 F (36.8 C) (Oral)  Resp 20  Ht 5\' 4"  (1.626 m)  Wt 155 lb (70.308 kg)  BMI 26.59 kg/m2  SpO2 100%   Physical Exam  Constitutional: She appears well-developed and well-nourished. No distress.  HENT:  Head: Normocephalic and atraumatic.  Mouth/Throat: Oropharynx is clear and moist.  Bruising and hematoma to left forehead; no open wounds or bleeding; mid-face stable, dentition intact; no hemotympanum  Eyes: Conjunctivae and EOM are normal. Pupils are equal, round, and reactive to light.  Neck: Normal range of motion. Neck supple.  Cardiovascular: Normal rate, regular rhythm and normal heart sounds.   Pulmonary/Chest: Effort normal and breath sounds normal. No respiratory distress. She has no wheezes.  Abdominal: Soft. Bowel sounds are normal. There is no tenderness. There is no guarding.  Musculoskeletal: Normal range of motion. She exhibits no edema.       Cervical back: She exhibits tenderness, bony tenderness and pain.       Thoracic back: Normal.       Lumbar back: Normal.  Tenderness of CS without bony deformity; full ROM maintained, equal grip strengths, normal sensation of BUE Pelvis stable and non-tender, no leg shortening noted  Neurological: She is alert.  Awake, oriented to self and place, moving all extremities well  Skin: Skin is warm and dry. She is not diaphoretic.  Psychiatric: She has a normal mood and affect.  Nursing note and vitals reviewed.   ED Course  Procedures (including critical care time) Labs Review Labs Reviewed  CBC WITH DIFFERENTIAL/PLATELET - Abnormal; Notable for the following:    Hemoglobin 11.8 (*)    HCT 35.5 (*)  Monocytes Relative 15 (*)    All other components within normal limits  BASIC  METABOLIC PANEL - Abnormal; Notable for the following:    Potassium 3.2 (*)    GFR calc non Af Amer 74 (*)    GFR calc Af Amer 86 (*)    All other components within normal limits  URINALYSIS, ROUTINE W REFLEX MICROSCOPIC - Abnormal; Notable for the following:    APPearance CLOUDY (*)    Leukocytes, UA SMALL (*)    All other components within normal limits  URINE MICROSCOPIC-ADD ON - Abnormal; Notable for the following:    Bacteria, UA FEW (*)    All other components within normal limits  URINE CULTURE  I-STAT TROPOININ, ED    Imaging Review Ct Head Wo Contrast  09/13/2014   CLINICAL DATA:  states that the pt had gotten up to get a pair of socks, but lost her footing and fell and hit her head on her room mate's bed. Pt takes ASA, but no other blood thinner. Pt has has mild swelling and bruising to the left forehead. Pt states that she normally walk with a walker; c/o headache and posterior neck pain radiating to shoulders  EXAM: CT HEAD WITHOUT CONTRAST  CT CERVICAL SPINE WITHOUT CONTRAST  TECHNIQUE: Multidetector CT imaging of the head and cervical spine was performed following the standard protocol without intravenous contrast. Multiplanar CT image reconstructions of the cervical spine were also generated.  COMPARISON:  Head CT 12/19/2011  FINDINGS: CT HEAD FINDINGS  No intracranial hemorrhage. No parenchymal contusion. No midline shift or mass effect. Basilar cisterns are patent. No skull base fracture. No fluid in the paranasal sinuses or mastoid air cells. Orbits are normal.  There is generalized cortical atrophy and proportional ventricular dilatation. Contents extensive periventricular white matter hypodensities.  Small left frontal scalp hematoma  CT CERVICAL SPINE FINDINGS  No prevertebral soft tissue swelling. Normal alignment of cervical vertebral bodies. No loss of vertebral body height. Normal facet articulation. Normal craniocervical junction.  There is history of normal cervical  lordosis. There is multiple levels of endplate spurring and joint space narrowing. Posterior osteophytosis at C3, C4, C5 and C6.  No evidence epidural or paraspinal hematoma.  IMPRESSION: 1. No acute intracranial trauma. 2. Small left frontal scalp hematoma. 3. No skull fracture. 4. No acute cervical spine fracture. 5. Multilevel disc osteophytic disease. 6. Straightening of the normal cervical lordosis may be secondary to position, muscle spasm, or ligamentous injury.   Electronically Signed   By: Suzy Bouchard M.D.   On: 09/13/2014 23:02   Ct Cervical Spine Wo Contrast  09/13/2014   CLINICAL DATA:  states that the pt had gotten up to get a pair of socks, but lost her footing and fell and hit her head on her room mate's bed. Pt takes ASA, but no other blood thinner. Pt has has mild swelling and bruising to the left forehead. Pt states that she normally walk with a walker; c/o headache and posterior neck pain radiating to shoulders  EXAM: CT HEAD WITHOUT CONTRAST  CT CERVICAL SPINE WITHOUT CONTRAST  TECHNIQUE: Multidetector CT imaging of the head and cervical spine was performed following the standard protocol without intravenous contrast. Multiplanar CT image reconstructions of the cervical spine were also generated.  COMPARISON:  Head CT 12/19/2011  FINDINGS: CT HEAD FINDINGS  No intracranial hemorrhage. No parenchymal contusion. No midline shift or mass effect. Basilar cisterns are patent. No skull base fracture. No fluid in the paranasal  sinuses or mastoid air cells. Orbits are normal.  There is generalized cortical atrophy and proportional ventricular dilatation. Contents extensive periventricular white matter hypodensities.  Small left frontal scalp hematoma  CT CERVICAL SPINE FINDINGS  No prevertebral soft tissue swelling. Normal alignment of cervical vertebral bodies. No loss of vertebral body height. Normal facet articulation. Normal craniocervical junction.  There is history of normal cervical  lordosis. There is multiple levels of endplate spurring and joint space narrowing. Posterior osteophytosis at C3, C4, C5 and C6.  No evidence epidural or paraspinal hematoma.  IMPRESSION: 1. No acute intracranial trauma. 2. Small left frontal scalp hematoma. 3. No skull fracture. 4. No acute cervical spine fracture. 5. Multilevel disc osteophytic disease. 6. Straightening of the normal cervical lordosis may be secondary to position, muscle spasm, or ligamentous injury.   Electronically Signed   By: Suzy Bouchard M.D.   On: 09/13/2014 23:02     EKG Interpretation None      MDM   Final diagnoses:  Fall  UTI (lower urinary tract infection)   81 year old female with dementia here following a mechanical fall. She apparently lost her footing while attempting to get some socks from dresser.  She was not using her walker at the time of fall. Patient denies dizziness or syncope prior to fall. She did hit her head on roommate's bed. On exam, patient is awake and oriented to self and place, some confusion regarding time. She does have a hematoma and bruising of the left side of her forehead.  CT head and CS obtained, no acute injuries noted. Lab work reassuring, mild hypokalemia at 3.2 which was replaced in ED.  U/a appears mildly infectious, small leuks, few bacteria, 7-10 WBCs.  Will treat with keflex pending urine culture given hx of UTI.  Patient d/c back to facility via [facility].  Discussed plan with patient, he/she acknowledged understanding and agreed with plan of care.  Return precautions given for new or worsening symptoms.  Larene Pickett, PA-C 09/14/14 [id number]  Tanna Furry, MD 09/14/14 2218

## 2014-09-14 DIAGNOSIS — S0083XA Contusion of other part of head, initial encounter: Secondary | ICD-10-CM | POA: Diagnosis not present

## 2014-09-14 LAB — URINE MICROSCOPIC-ADD ON

## 2014-09-14 LAB — URINALYSIS, ROUTINE W REFLEX MICROSCOPIC
Bilirubin Urine: NEGATIVE
Glucose, UA: NEGATIVE mg/dL
Hgb urine dipstick: NEGATIVE
Ketones, ur: NEGATIVE mg/dL
Nitrite: NEGATIVE
Protein, ur: NEGATIVE mg/dL
Specific Gravity, Urine: 1.005 (ref 1.005–1.030)
Urobilinogen, UA: 0.2 mg/dL (ref 0.0–1.0)
pH: 7 (ref 5.0–8.0)

## 2014-09-14 MED ORDER — POTASSIUM CHLORIDE CRYS ER 20 MEQ PO TBCR
40.0000 meq | EXTENDED_RELEASE_TABLET | Freq: Once | ORAL | Status: AC
  Administered 2014-09-14: 40 meq via ORAL
  Filled 2014-09-14: qty 2

## 2014-09-14 MED ORDER — CEPHALEXIN 500 MG PO CAPS: 500.0000 mg | ORAL_CAPSULE | Freq: Three times a day (TID) | ORAL | Status: DC

## 2014-09-14 NOTE — Discharge Instructions (Signed)
Take the prescribed medication as directed. °Follow-up with your primary care physician. °Return to the ED for new or worsening symptoms. ° °

## 2014-09-14 NOTE — ED Notes (Signed)
Called [facility] for Transport @ [phone]

## 2014-09-16 LAB — URINE CULTURE: Colony Count: >=100000

## 2014-09-17 ENCOUNTER — Telehealth (HOSPITAL_COMMUNITY): Payer: Self-pay

## 2014-09-17 NOTE — Telephone Encounter (Signed)
Encounter complete. 

## 2014-09-17 NOTE — Telephone Encounter (Signed)
I did speak with Lanier Clam at [facility] assisted living to give pt instructions. Veronda Prude was notified that there would need to be someone stay with the pt on the day of testing. Encounter complete.

## 2014-09-19 ENCOUNTER — Ambulatory Visit (HOSPITAL_COMMUNITY)
Admission: RE | Admit: 2014-09-19 | Discharge: 2014-09-19 | Disposition: A | Discharge status: Home / Self Care | Payer: Medicare Other | Source: Ambulatory Visit | Attending: Cardiology | Admitting: Cardiology

## 2014-09-19 DIAGNOSIS — F0390 Unspecified dementia without behavioral disturbance: Secondary | ICD-10-CM | POA: Diagnosis not present

## 2014-09-19 DIAGNOSIS — I2510 Atherosclerotic heart disease of native coronary artery without angina pectoris: Secondary | ICD-10-CM | POA: Insufficient documentation

## 2014-09-19 DIAGNOSIS — I10 Essential (primary) hypertension: Secondary | ICD-10-CM | POA: Insufficient documentation

## 2014-09-19 DIAGNOSIS — Z8249 Family history of ischemic heart disease and other diseases of the circulatory system: Secondary | ICD-10-CM | POA: Diagnosis not present

## 2014-09-19 DIAGNOSIS — R0789 Other chest pain: Secondary | ICD-10-CM | POA: Diagnosis present

## 2014-09-19 DIAGNOSIS — E119 Type 2 diabetes mellitus without complications: Secondary | ICD-10-CM | POA: Diagnosis not present

## 2014-09-19 DIAGNOSIS — I509 Heart failure, unspecified: Secondary | ICD-10-CM | POA: Diagnosis not present

## 2014-09-19 MED ORDER — TECHNETIUM TC 99M SESTAMIBI GENERIC - CARDIOLITE
32.0000 | Freq: Once | INTRAVENOUS | Status: AC | PRN
  Administered 2014-09-19: 32 via INTRAVENOUS

## 2014-09-19 MED ORDER — REGADENOSON 0.4 MG/5ML IV SOLN
0.4000 mg | Freq: Once | INTRAVENOUS | Status: AC
  Administered 2014-09-19: 0.4 mg via INTRAVENOUS

## 2014-09-19 MED ORDER — AMINOPHYLLINE 25 MG/ML IV SOLN
75.0000 mg | Freq: Once | INTRAVENOUS | Status: AC
  Administered 2014-09-19: 75 mg via INTRAVENOUS

## 2014-09-19 MED ORDER — TECHNETIUM TC 99M SESTAMIBI GENERIC - CARDIOLITE
10.3000 | Freq: Once | INTRAVENOUS | Status: AC | PRN
  Administered 2014-09-19: 10 via INTRAVENOUS

## 2014-09-19 NOTE — Procedures (Addendum)
[facility] [address] [place] [address] [phone]  Cardiology Nuclear Med Study  [name] is a 81 y.o. female     MRN : [id number]     DOB: 06-04-1927  Procedure Date: 09/19/2014  Nuclear Med Background Indication for Stress Test:  [facility] and Follow up CAD History:  Chronic bronchitis;CAD;CHF;Last NUC MPI on 11/30/2012-normal;EF=80% Cardiac Risk Factors: Family History - CAD, Hypertension, Lipids, NIDDM, Overweight and dementia  Symptoms:  Chest Pain, DOE and Fatigue   Nuclear Pre-Procedure Caffeine/Decaff Intake:  8:00pm NPO After: 4:00am   IV Site: R Forearm  IV 0.9% NS with Angio Cath:  22g  Chest Size (in):  n/a IV Started by: Rolene Course, RN  Height: 5\' 5"  (1.651 m)  Cup Size: C  BMI:  Body mass index is 25.79 kg/(m^2). Weight:  155 lb (70.308 kg)   Tech Comments:  n/a    Nuclear Med Study 1 or 2 day study: 1 day  Stress Test Type:  [place] Provider:  Quay Burow, MD   Resting Radionuclide: Technetium 84m Sestamibi  Resting Radionuclide Dose: 10.3 mCi   Stress Radionuclide:  Technetium 9m Sestamibi  Stress Radionuclide Dose: 32.0 mCi           Stress Protocol Rest HR: 65 Stress HR: 101  Rest BP: 202/89 Stress BP: 207/83  Exercise Time (min): n/a METS: n/a          Dose of Adenosine (mg):  n/a Dose of Lexiscan: 0.4 mg  Dose of Atropine (mg): n/a Dose of Dobutamine: n/a mcg/kg/min (at max HR)  Stress Test Technologist: Mellody Memos, CCT Nuclear Technologist: Imagene Riches, CNMT   Rest Procedure:  Myocardial perfusion imaging was performed at rest 45 minutes following the intravenous administration of Technetium 28m Sestamibi. Stress Procedure:  The patient received IV Lexiscan 0.4 mg over 15-seconds.  Technetium 85m Sestamibi injected IV at 30-seconds.  Patient experienced shortness of breath, head pain and was administered 75 mg of Aminophylline IV at 5  minutes. There were no significant changes with Lexiscan.  Quantitative spect images were obtained after a 45 minute delay.  Transient Ischemic Dilatation (Normal <1.22):  1.12  QGS EDV:  97 ml QGS ESV:  35 ml LV Ejection Fraction: 64%        Rest ECG: NSR - Normal EKG  Stress ECG: No significant change from baseline ECG  QPS Raw Data Images:  Normal; no motion artifact; normal heart/lung ratio. Stress Images:  Normal homogeneous uptake in all areas of the myocardium. Rest Images:  Normal homogeneous uptake in all areas of the myocardium. Subtraction (SDS):  No evidence of ischemia.  Impression Exercise Capacity:  Lexiscan with no exercise. BP Response:  Hypertensive blood pressure response. Clinical Symptoms:  No significant symptoms noted. ECG Impression:  No significant ST segment change suggestive of ischemia. Comparison with Prior Nuclear Study: No images to compare  Overall Impression:  Normal stress nuclear study.  LV Wall Motion:  NL LV Function; NL Wall Motion   Lorretta Harp, MD  09/20/2014 8:49 AM

## 2014-09-27 ENCOUNTER — Emergency Department (HOSPITAL_COMMUNITY): Payer: Medicare Other

## 2014-09-27 ENCOUNTER — Emergency Department (HOSPITAL_COMMUNITY)
Admission: EM | Admit: 2014-09-27 | Discharge: 2014-09-28 | Disposition: A | Discharge status: Home / Self Care | Payer: Medicare Other | Attending: Emergency Medicine | Admitting: Emergency Medicine

## 2014-09-27 ENCOUNTER — Encounter (HOSPITAL_COMMUNITY): Payer: Self-pay

## 2014-09-27 DIAGNOSIS — S79911A Unspecified injury of right hip, initial encounter: Secondary | ICD-10-CM | POA: Diagnosis not present

## 2014-09-27 DIAGNOSIS — Z9861 Coronary angioplasty status: Secondary | ICD-10-CM | POA: Insufficient documentation

## 2014-09-27 DIAGNOSIS — Z792 Long term (current) use of antibiotics: Secondary | ICD-10-CM | POA: Diagnosis not present

## 2014-09-27 DIAGNOSIS — Z7982 Long term (current) use of aspirin: Secondary | ICD-10-CM | POA: Insufficient documentation

## 2014-09-27 DIAGNOSIS — W19XXXA Unspecified fall, initial encounter: Secondary | ICD-10-CM

## 2014-09-27 DIAGNOSIS — E119 Type 2 diabetes mellitus without complications: Secondary | ICD-10-CM | POA: Diagnosis not present

## 2014-09-27 DIAGNOSIS — Z9981 Dependence on supplemental oxygen: Secondary | ICD-10-CM | POA: Insufficient documentation

## 2014-09-27 DIAGNOSIS — E669 Obesity, unspecified: Secondary | ICD-10-CM | POA: Diagnosis not present

## 2014-09-27 DIAGNOSIS — Z862 Personal history of diseases of the blood and blood-forming organs and certain disorders involving the immune mechanism: Secondary | ICD-10-CM | POA: Insufficient documentation

## 2014-09-27 DIAGNOSIS — S4992XA Unspecified injury of left shoulder and upper arm, initial encounter: Secondary | ICD-10-CM | POA: Insufficient documentation

## 2014-09-27 DIAGNOSIS — F0390 Unspecified dementia without behavioral disturbance: Secondary | ICD-10-CM | POA: Diagnosis not present

## 2014-09-27 DIAGNOSIS — T07XXXA Unspecified multiple injuries, initial encounter: Secondary | ICD-10-CM

## 2014-09-27 DIAGNOSIS — F419 Anxiety disorder, unspecified: Secondary | ICD-10-CM | POA: Insufficient documentation

## 2014-09-27 DIAGNOSIS — Z86010 Personal history of colonic polyps: Secondary | ICD-10-CM | POA: Insufficient documentation

## 2014-09-27 DIAGNOSIS — Z791 Long term (current) use of non-steroidal anti-inflammatories (NSAID): Secondary | ICD-10-CM | POA: Diagnosis not present

## 2014-09-27 DIAGNOSIS — K219 Gastro-esophageal reflux disease without esophagitis: Secondary | ICD-10-CM | POA: Insufficient documentation

## 2014-09-27 DIAGNOSIS — Y9289 Other specified places as the place of occurrence of the external cause: Secondary | ICD-10-CM | POA: Insufficient documentation

## 2014-09-27 DIAGNOSIS — G8929 Other chronic pain: Secondary | ICD-10-CM | POA: Insufficient documentation

## 2014-09-27 DIAGNOSIS — I2510 Atherosclerotic heart disease of native coronary artery without angina pectoris: Secondary | ICD-10-CM | POA: Diagnosis not present

## 2014-09-27 DIAGNOSIS — Z79899 Other long term (current) drug therapy: Secondary | ICD-10-CM | POA: Diagnosis not present

## 2014-09-27 DIAGNOSIS — Z9842 Cataract extraction status, left eye: Secondary | ICD-10-CM | POA: Insufficient documentation

## 2014-09-27 DIAGNOSIS — S199XXA Unspecified injury of neck, initial encounter: Secondary | ICD-10-CM | POA: Insufficient documentation

## 2014-09-27 DIAGNOSIS — Z853 Personal history of malignant neoplasm of breast: Secondary | ICD-10-CM | POA: Diagnosis not present

## 2014-09-27 DIAGNOSIS — S00432A Contusion of left ear, initial encounter: Secondary | ICD-10-CM | POA: Diagnosis not present

## 2014-09-27 DIAGNOSIS — G4733 Obstructive sleep apnea (adult) (pediatric): Secondary | ICD-10-CM | POA: Insufficient documentation

## 2014-09-27 DIAGNOSIS — I4891 Unspecified atrial fibrillation: Secondary | ICD-10-CM | POA: Insufficient documentation

## 2014-09-27 DIAGNOSIS — Z8709 Personal history of other diseases of the respiratory system: Secondary | ICD-10-CM | POA: Insufficient documentation

## 2014-09-27 DIAGNOSIS — M1990 Unspecified osteoarthritis, unspecified site: Secondary | ICD-10-CM | POA: Diagnosis not present

## 2014-09-27 DIAGNOSIS — Z7951 Long term (current) use of inhaled steroids: Secondary | ICD-10-CM | POA: Diagnosis not present

## 2014-09-27 DIAGNOSIS — E785 Hyperlipidemia, unspecified: Secondary | ICD-10-CM | POA: Insufficient documentation

## 2014-09-27 DIAGNOSIS — Y9389 Activity, other specified: Secondary | ICD-10-CM | POA: Insufficient documentation

## 2014-09-27 DIAGNOSIS — W1839XA Other fall on same level, initial encounter: Secondary | ICD-10-CM | POA: Insufficient documentation

## 2014-09-27 DIAGNOSIS — S0991XA Unspecified injury of ear, initial encounter: Secondary | ICD-10-CM | POA: Diagnosis present

## 2014-09-27 DIAGNOSIS — Z9841 Cataract extraction status, right eye: Secondary | ICD-10-CM | POA: Diagnosis not present

## 2014-09-27 DIAGNOSIS — I252 Old myocardial infarction: Secondary | ICD-10-CM | POA: Insufficient documentation

## 2014-09-27 DIAGNOSIS — Z87442 Personal history of urinary calculi: Secondary | ICD-10-CM | POA: Diagnosis not present

## 2014-09-27 DIAGNOSIS — Z9889 Other specified postprocedural states: Secondary | ICD-10-CM | POA: Diagnosis not present

## 2014-09-27 DIAGNOSIS — Y998 Other external cause status: Secondary | ICD-10-CM | POA: Diagnosis not present

## 2014-09-27 DIAGNOSIS — I10 Essential (primary) hypertension: Secondary | ICD-10-CM | POA: Insufficient documentation

## 2014-09-27 DIAGNOSIS — I509 Heart failure, unspecified: Secondary | ICD-10-CM | POA: Diagnosis not present

## 2014-09-27 LAB — CBC WITH DIFFERENTIAL/PLATELET
BASOS ABS: 0 10*3/uL (ref 0.0–0.1)
Basophils Relative: 1 % (ref 0–1)
EOS PCT: 4 % (ref 0–5)
Eosinophils Absolute: 0.1 10*3/uL (ref 0.0–0.7)
HCT: 35.7 % — ABNORMAL LOW (ref 36.0–46.0)
HEMOGLOBIN: 11.7 g/dL — AB (ref 12.0–15.0)
LYMPHS PCT: 37 % (ref 12–46)
Lymphs Abs: 1.4 10*3/uL (ref 0.7–4.0)
MCH: 29.5 pg (ref 26.0–34.0)
MCHC: 32.8 g/dL (ref 30.0–36.0)
MCV: 89.9 fL (ref 78.0–100.0)
MONO ABS: 0.6 10*3/uL (ref 0.1–1.0)
MONOS PCT: 15 % — AB (ref 3–12)
Neutro Abs: 1.7 10*3/uL (ref 1.7–7.7)
Neutrophils Relative %: 43 % (ref 43–77)
Platelets: 261 10*3/uL (ref 150–400)
RBC: 3.97 MIL/uL (ref 3.87–5.11)
RDW: 13.6 % (ref 11.5–15.5)
WBC: 3.9 10*3/uL — AB (ref 4.0–10.5)

## 2014-09-27 LAB — BASIC METABOLIC PANEL
Anion gap: 9 (ref 5–15)
BUN: 14 mg/dL (ref 6–23)
CHLORIDE: 105 mmol/L (ref 96–112)
CO2: 24 mmol/L (ref 19–32)
CREATININE: 0.73 mg/dL (ref 0.50–1.10)
Calcium: 9.6 mg/dL (ref 8.4–10.5)
GFR calc Af Amer: 88 mL/min — ABNORMAL LOW (ref >90)
GFR calc non Af Amer: 76 mL/min — ABNORMAL LOW (ref >90)
GLUCOSE: 94 mg/dL (ref 70–99)
Potassium: 3.7 mmol/L (ref 3.5–5.1)
Sodium: 138 mmol/L (ref 135–145)

## 2014-09-27 LAB — URINALYSIS, ROUTINE W REFLEX MICROSCOPIC
BILIRUBIN URINE: NEGATIVE
Glucose, UA: NEGATIVE mg/dL
Hgb urine dipstick: NEGATIVE
Ketones, ur: NEGATIVE mg/dL
Leukocytes, UA: NEGATIVE
Nitrite: NEGATIVE
PH: 7.5 (ref 5.0–8.0)
Protein, ur: NEGATIVE mg/dL
SPECIFIC GRAVITY, URINE: 1.009 (ref 1.005–1.030)
Urobilinogen, UA: 0.2 mg/dL (ref 0.0–1.0)

## 2014-09-27 MED ORDER — ACETAMINOPHEN 325 MG PO TABS
650.0000 mg | ORAL_TABLET | Freq: Once | ORAL | Status: AC
  Administered 2014-09-27: 650 mg via ORAL
  Filled 2014-09-27: qty 2

## 2014-09-27 MED ORDER — LORAZEPAM 0.5 MG PO TABS
0.5000 mg | ORAL_TABLET | Freq: Once | ORAL | Status: AC
  Administered 2014-09-27: 0.5 mg via ORAL
  Filled 2014-09-27: qty 1

## 2014-09-27 NOTE — ED Notes (Signed)
Pt c/o generalized back pain, bilateral leg pain, bilateral shoulder pain, and headache.  Pain score 10/10.   When staff went in to clear LSB, Pt was found to have c-collar around forehead.  Staff attempted to reapply collar.

## 2014-09-27 NOTE — ED Notes (Signed)
[facility] called for transport.  

## 2014-09-27 NOTE — Progress Notes (Signed)
CSW met with pt at bedside. There was no family present.Patient confirms that she is from [facility]. Also, pt confirms that she presents tonight due to back, leg, and shoulder pain. Patient states that she fell today.She states that she has been hurting ever since she fell. Patient states that her fall was caused by her "right leg giving out".    Pt informed CSW that she was in pain currently while at bedside. Patient stated " I think I broke something." Patient states that while at the facility the staff help her complete her ADL's.  Patient states that her support system is her sister. She informed CSW that her sister lives in [place] and visits her about x2 a week.  Patient states that she does not have nay questions.   Willette Brace [phone] ED CSW 09/27/2014 7:46 PM

## 2014-09-27 NOTE — Discharge Instructions (Signed)
Take Tylenol, 650 mg, every 4 hours, for pain. See your doctor next week for a checkup    Contusion A contusion is a deep bruise. Contusions are the result of an injury that caused bleeding under the skin. The contusion may turn blue, purple, or yellow. Minor injuries will give you a painless contusion, but more severe contusions may stay painful and swollen for a few weeks.  CAUSES  A contusion is usually caused by a blow, trauma, or direct force to an area of the body. SYMPTOMS   Swelling and redness of the injured area.  Bruising of the injured area.  Tenderness and soreness of the injured area.  Pain. DIAGNOSIS  The diagnosis can be made by taking a history and physical exam. An X-ray, CT scan, or MRI may be needed to determine if there were any associated injuries, such as fractures. TREATMENT  Specific treatment will depend on what area of the body was injured. In general, the best treatment for a contusion is resting, icing, elevating, and applying cold compresses to the injured area. Over-the-counter medicines may also be recommended for pain control. Ask your caregiver what the best treatment is for your contusion. HOME CARE INSTRUCTIONS   Put ice on the injured area.  Put ice in a plastic bag.  Place a towel between your skin and the bag.  Leave the ice on for 15-20 minutes, 3-4 times a day, or as directed by your health care provider.  Only take over-the-counter or prescription medicines for pain, discomfort, or fever as directed by your caregiver. Your caregiver may recommend avoiding anti-inflammatory medicines (aspirin, ibuprofen, and naproxen) for 48 hours because these medicines may increase bruising.  Rest the injured area.  If possible, elevate the injured area to reduce swelling. SEEK IMMEDIATE MEDICAL CARE IF:   You have increased bruising or swelling.  You have pain that is getting worse.  Your swelling or pain is not relieved with medicines. MAKE SURE  YOU:   Understand these instructions.  Will watch your condition.  Will get help right away if you are not doing well or get worse. Document Released: 03/24/2005 Document Revised: 06/19/2013 Document Reviewed: 04/19/2011 [facility] Patient Information 2015 [place], [place]. This information is not intended to replace advice given to you by your health care provider. Make sure you discuss any questions you have with your health care provider.  Fall Prevention and Home Safety Falls cause injuries and can affect all age groups. It is possible to prevent falls.  HOW TO PREVENT FALLS  Wear shoes with rubber soles that do not have an opening for your toes.  Keep the inside and outside of your house well lit.  Use night lights throughout your home.  Remove clutter from floors.  Clean up floor spills.  Remove throw rugs or fasten them to the floor with carpet tape.  Do not place electrical cords across pathways.  Put grab bars by your tub, shower, and toilet. Do not use towel bars as grab bars.  Put handrails on both sides of the stairway. Fix loose handrails.  Do not climb on stools or stepladders, if possible.  Do not wax your floors.  Repair uneven or unsafe sidewalks, walkways, or stairs.  Keep items you use a lot within reach.  Be aware of pets.  Keep emergency numbers next to the telephone.  Put smoke detectors in your home and near bedrooms. Ask your doctor what other things you can do to prevent falls. Document Released: 04/10/2009  Document Revised: 12/14/2011 Document Reviewed: 09/14/2011 [facility] Patient Information 2015 [place], [place]. This information is not intended to replace advice given to you by your health care provider. Make sure you discuss any questions you have with your health care provider.

## 2014-09-27 NOTE — ED Notes (Signed)
Per EMS, Pt, from [facility], c/o R shoulder, neck, low back, and R knee pain after a witnessed fall.  Staff reports that Pt's R leg "gave out" causing her to fall.  Pt did hit head and hematoma noted on posterior head.  Denies LOC. No blood thinners.  A & Ox4.  Pt is at neuro baseline.  EMS noted shortening to R leg.

## 2014-09-27 NOTE — ED Notes (Signed)
Delay in lab draw, pt not in room 

## 2014-09-27 NOTE — ED Provider Notes (Signed)
CSN: [id number]     Arrival date & time 09/27/14  1629 History   First MD Initiated Contact with Patient 09/27/14 1756     Chief Complaint  Patient presents with  . Fall  . Shoulder Pain  . Neck Pain     (Consider location/radiation/quality/duration/timing/severity/associated sxs/prior Treatment) Patient is a 81 y.o. female presenting with fall, shoulder pain, and neck pain. The history is provided by the patient.  Fall  Shoulder Pain Associated symptoms: neck pain   Neck Pain  Marguita Venning is a 81 y.o. female here for evaluation of ground-level fall with injury to the head and left shoulder. Patient [place] a retirement community, independently. Recalls falling, but cannot remember why. She was unable to get up without help. She also has pain in the left side of her head. No known preceding symptoms.  Level V Caveat- confusion     Past Medical History  Diagnosis Date  . Coronary artery disease   . Dementia   . Hypertension   . Hyperlipidemia   . Anxiety   . GERD (gastroesophageal reflux disease)   . CHF (congestive heart failure)   . Polyp, stomach 11/11/2006  . Aphakia of left eye   . Obese   . MI (myocardial infarction)   . Chest pain at rest 12/20/2011  . CAD (coronary artery disease), cutting balloon athrectomy of her dominant AV groove of LCX.  2007 12/20/2011  . Bradycardia 12/20/2011  . HTN (hypertension) 12/20/2011  . Dyslipidemia  12/20/2011  . Cardiomyopathy   . History of GI bleed   . Atrial fibrillation   . Sigmoid diverticulosis   . Anemia, iron deficiency 10/24/2012  . Constipation due to pain medication 10/24/2012  . Cataract     "coming back in both eyes" (06/19/2013)  . Chronic bronchitis     "I have it all year round"  . Shortness of breath     "comes on at anytime" (06/19/2013)  . OSA on CPAP 12/20/2011  . Chronic back pain     "all over my back"   . Breast cancer, right breast   . Type II diabetes mellitus   . Headache     "right often"  (08/09/2014)  . DJD (degenerative joint disease)   . Osteoarthritis   . Arthritis     "all over my body"  . Nephrolithiasis     bilateral   Past Surgical History  Procedure Laterality Date  . Back surgery    . Appendectomy    . Fracture surgery    . Dilation and curettage of uterus    . Esophagogastroduodenoscopy    . Colonoscopy    . Eus    . Esophagogastroduodenoscopy  02/21/2012    Procedure: ESOPHAGOGASTRODUODENOSCOPY (EGD);  Surgeon: Gatha Mayer, MD;  Location: [facility];  Service: Endoscopy;  Laterality: N/A;  pt. being evaluated for a pacemaker  . Balloon dilation  02/21/2012    Procedure: BALLOON DILATION;  Surgeon: Gatha Mayer, MD;  Location: [facility];  Service: Endoscopy;  Laterality: N/A;  . Colonoscopy N/A 10/24/2012    Procedure: COLONOSCOPY;  Surgeon: Gatha Mayer, MD;  Location: [facility];  Service: Endoscopy;  Laterality: N/A;  . US echocardiography  01/25/2008    mild DUST,mild MR,mod. ca+ mitral annular,AOV mildly sclerotid  . Lexiscan mycardial perfusion scan  01/25/2008    Normal  . Inguinal hernia repair Right 06/18/2013  . Tonsillectomy    . Total abdominal hysterectomy    . Cataract extraction w/  intraocular lens  implant, bilateral Bilateral   . Breast biopsy Right   . Breast lumpectomy Right   . Inguinal hernia repair Right 06/18/2013    Procedure: HERNIA REPAIR INGUINAL ADULT;  Surgeon: Gwenyth Ober, MD;  Location: [facility];  Service: General;  Laterality: Right;  . Insertion of mesh Right 06/18/2013    Procedure: INSERTION OF MESH;  Surgeon: Gwenyth Ober, MD;  Location: [facility];  Service: General;  Laterality: Right;  . Left heart catheterization with coronary angiogram N/A 12/21/2011    Procedure: LEFT HEART CATHETERIZATION WITH CORONARY ANGIOGRAM;  Surgeon: Lorretta Harp, MD;  Location: [facility];  Service: Cardiovascular;  Laterality: N/A;  . Glaucoma surgery Bilateral   . Eye surgery    . Coronary angioplasty with stent placement       "Dr. Alvester Chou put it in; it's been in a long time"  . Coronary angioplasty  03-07-2004    cutting balloon  . Cardiac catheterization  06/05/2004    high grade disease AV groove CX  . Cardiac catheterization  04/19/2005    noncritical CAD   Family History  Problem Relation Age of Onset  . Colon cancer Mother   . Heart disease Mother   . Stroke Mother    History  Substance Use Topics  . Smoking status: Never Smoker   . Smokeless tobacco: Never Used  . Alcohol Use: No   OB History    No data available     Review of Systems  Unable to perform ROS Musculoskeletal: Positive for neck pain.      Allergies  Chicken allergy; Fish allergy; and Percocet  Home Medications   Prior to Admission medications   Medication Sig Start Date End Date Taking? Authorizing Provider  acetaminophen (TYLENOL) 500 MG tablet Take 500 mg by mouth 3 (three) times daily.   Yes Historical Provider, MD  amLODipine (NORVASC) 10 MG tablet Take 10 mg by mouth daily.   Yes Historical Provider, MD  aspirin 81 MG chewable tablet Chew 81 mg by mouth daily.   Yes Historical Provider, MD  bisacodyl (DULCOLAX) 10 MG suppository Place 1 suppository (10 mg total) rectally daily as needed (no bowel movement in 24 hours). 08/12/14  Yes Janece Canterbury, MD  cetirizine (ZYRTEC) 5 MG tablet Take 5 mg by mouth every morning.    Yes Historical Provider, MD  cholecalciferol (VITAMIN D) 400 UNITS TABS tablet Take 800 Units by mouth daily.   Yes Historical Provider, MD  citalopram (CELEXA) 20 MG tablet Take 20 mg by mouth daily before breakfast.    Yes Historical Provider, MD  docusate sodium (COLACE) 100 MG capsule Take 100 mg by mouth 2 (two) times daily.   Yes Historical Provider, MD  esomeprazole (NEXIUM) 40 MG capsule Take 40 mg by mouth daily before breakfast.   Yes Historical Provider, MD  fluticasone (FLONASE) 50 MCG/ACT nasal spray Place 2 sprays into both nostrils daily.    Yes Historical Provider, MD  hydrALAZINE  (APRESOLINE) 25 MG tablet Take 25 mg by mouth 3 (three) times daily.   Yes Historical Provider, MD  isosorbide mononitrate (IMDUR) 60 MG 24 hr tablet Take 1 tablet (60 mg total) by mouth daily before breakfast. 11/16/12  Yes Isaiah Serge, NP  ketorolac (ACULAR) 0.5 % ophthalmic solution Place 1 drop into both eyes daily.   Yes Historical Provider, MD  losartan-hydrochlorothiazide (HYZAAR) 100-25 MG per tablet Take 1 tablet by mouth daily before breakfast.    Yes Historical  Provider, MD  lubiprostone (AMITIZA) 24 MCG capsule Take 1 capsule (24 mcg total) by mouth 2 (two) times daily with a meal. 10/24/12  Yes Gatha Mayer, MD  mirabegron ER (MYRBETRIQ) 25 MG TB24 tablet Take 25 mg by mouth daily.   Yes Historical Provider, MD  nitroGLYCERIN (NITROLINGUAL) 0.4 MG/SPRAY spray Place 2 sprays under the tongue every 5 (five) minutes as needed. Use 2 sprays under tongue as needed for chest pain. Repeat 2 sprays if pain persists after 15 minutes if pain still persists   Yes Historical Provider, MD  Olopatadine HCl (PATADAY) 0.2 % SOLN Place 1 drop into both eyes daily.    Yes Historical Provider, MD  polyethylene glycol (MIRALAX / GLYCOLAX) packet Take 17 g by mouth 3 (three) times daily. Mix in 8 oz of suitable liquid and drink by mouth every day Patient taking differently: Take 17 g by mouth daily. Mix in 8 oz of suitable liquid and drink by mouth every day 08/12/14  Yes Janece Canterbury, MD  rosuvastatin (CRESTOR) 5 MG tablet Take 5 mg by mouth at bedtime.    Yes Historical Provider, MD  senna (SENOKOT) 8.6 MG TABS Take 1 tablet by mouth 2 (two) times daily.   Yes Historical Provider, MD  spironolactone (ALDACTONE) 25 MG tablet Take 25 mg by mouth daily before breakfast.    Yes Historical Provider, MD  tamsulosin (FLOMAX) 0.4 MG CAPS capsule Take 1 capsule (0.4 mg total) by mouth daily. 08/12/14  Yes Janece Canterbury, MD  cephALEXin (KEFLEX) 500 MG capsule Take 1 capsule (500 mg total) by mouth 3 (three)  times daily. Patient not taking: Reported on 09/27/2014 09/14/14   Larene Pickett, PA-C  PROCTOZONE-HC 2.5 % rectal cream Apply 1 application topically as needed. 08/15/14   Historical Provider, MD   BP 196/68 mmHg  Pulse 70  Temp(Src) 98.2 F (36.8 C) (Oral)  Resp 18  SpO2 96% Physical Exam  Constitutional: She appears well-developed. No distress.  Elderly, frail  HENT:  Head: Normocephalic.  Right Ear: External ear normal.  Left Ear: External ear normal.  Small contusion, left parietal, no associated abrasion or laceration.  Eyes: Conjunctivae and EOM are normal. Pupils are equal, round, and reactive to light.  Neck: Normal range of motion and phonation normal. Neck supple.  Cardiovascular: Normal rate, regular rhythm and normal heart sounds.   Pulmonary/Chest: Effort normal and breath sounds normal. She exhibits no bony tenderness.  Abdominal: Soft. There is no tenderness.  Musculoskeletal: She exhibits no edema.  Mild pain, right hip with passive range of motion, and somewhat limited active range of motion of the right hip secondary to pain. Tender left shoulder, without deformity.  Neurological: She is alert. No cranial nerve deficit or sensory deficit. She exhibits normal muscle tone. Coordination normal.  Skin: Skin is warm, dry and intact.  Psychiatric: She has a normal mood and affect. Her behavior is normal.  Nursing note and vitals reviewed.   ED Course  Procedures (including critical care time)   Medications  acetaminophen (TYLENOL) tablet 650 mg (650 mg Oral Given 09/27/14 2217)  LORazepam (ATIVAN) tablet 0.5 mg (0.5 mg Oral Given 09/27/14 2217)    Patient Vitals for the past 24 hrs:  BP Temp Temp src Pulse Resp SpO2  09/27/14 2130 196/68 mmHg - - 70 - 96 %  09/27/14 2100 197/60 mmHg - - 72 - 98 %  09/27/14 2033 (!) 219/66 mmHg - - 63 18 98 %  09/27/14 1701 198/67  mmHg - - - - -  09/27/14 1643 (!) 215/78 mmHg 98.2 F (36.8 C) Oral 74 18 96 %  09/27/14 1633 - - - -  - 99 %    10:50 PM Reevaluation with update and discussion. After initial assessment and treatment, an updated evaluation reveals vital signs improved with Ativan and Tylenol for discomfort.Daleen Bo L    Labs Review Labs Reviewed  BASIC METABOLIC PANEL - Abnormal; Notable for the following:    GFR calc non Af Amer 76 (*)    GFR calc Af Amer 88 (*)    All other components within normal limits  CBC WITH DIFFERENTIAL/PLATELET - Abnormal; Notable for the following:    WBC 3.9 (*)    Hemoglobin 11.7 (*)    HCT 35.7 (*)    Monocytes Relative 15 (*)    All other components within normal limits  URINE CULTURE  URINALYSIS, ROUTINE W REFLEX MICROSCOPIC    Imaging Review Dg Pelvis 1-2 Views  09/27/2014   CLINICAL DATA:  Pain after falling  EXAM: PELVIS - 1-2 VIEW  COMPARISON:  None.  FINDINGS: There is no evidence of pelvic fracture or diastasis. No pelvic bone lesions are seen.  IMPRESSION: Negative.   Electronically Signed   By: Andreas Newport M.D.   On: 09/27/2014 20:56   Ct Head Wo Contrast  09/27/2014   CLINICAL DATA:  Fall with shoulder and neck pain.  EXAM: CT HEAD WITHOUT CONTRAST  CT CERVICAL SPINE WITHOUT CONTRAST  TECHNIQUE: Multidetector CT imaging of the head and cervical spine was performed following the standard protocol without intravenous contrast. Multiplanar CT image reconstructions of the cervical spine were also generated.  COMPARISON:  09/13/2014  FINDINGS: CT HEAD FINDINGS  Sinuses/Soft tissues: Mucous retention cyst or polyp in the right maxillary sinus. Hypoplastic right frontal sinus. No scalp soft tissue swelling or skull fracture.  Intracranial: Marked low density in the periventricular white matter likely related to small vessel disease. Cerebral atrophy. Ventriculomegaly is felt to be secondary. No mass lesion, hemorrhage, hydrocephalus, acute infarct, intra-axial, or extra-axial fluid collection.  CT CERVICAL SPINE FINDINGS  Spinal visualization through the  bottom of T2. Prevertebral soft tissues are within normal limits. Dense carotid atherosclerosis. Left-sided thyroid enlargement, likely due to multinodular goiter. No apical pneumothorax.  Multilevel spondylosis, resulting in areas of central canal and bilateral neural foraminal narrowing.  Skull base intact. Maintenance of vertebral body height. Straightening of expected cervical lordosis. Facets are well-aligned. Prominent posterior osteophytes at C5-6. C1-2 demonstrates only degenerative change on coronal reformatted images.  IMPRESSION: 1.  No acute intracranial abnormality. 2.  Cerebral atrophy and small vessel ischemic change. 3. Sinus disease. 4. No cervical spine acute fracture or subluxation; advanced spondylosis. 5. Straightening of expected cervical lordosis could be positional, due to muscular spasm, or ligamentous injury.   Electronically Signed   By: Abigail Miyamoto M.D.   On: 09/27/2014 20:59   Ct Cervical Spine Wo Contrast  09/27/2014   CLINICAL DATA:  Fall with shoulder and neck pain.  EXAM: CT HEAD WITHOUT CONTRAST  CT CERVICAL SPINE WITHOUT CONTRAST  TECHNIQUE: Multidetector CT imaging of the head and cervical spine was performed following the standard protocol without intravenous contrast. Multiplanar CT image reconstructions of the cervical spine were also generated.  COMPARISON:  09/13/2014  FINDINGS: CT HEAD FINDINGS  Sinuses/Soft tissues: Mucous retention cyst or polyp in the right maxillary sinus. Hypoplastic right frontal sinus. No scalp soft tissue swelling or skull fracture.  Intracranial: Marked low  density in the periventricular white matter likely related to small vessel disease. Cerebral atrophy. Ventriculomegaly is felt to be secondary. No mass lesion, hemorrhage, hydrocephalus, acute infarct, intra-axial, or extra-axial fluid collection.  CT CERVICAL SPINE FINDINGS  Spinal visualization through the bottom of T2. Prevertebral soft tissues are within normal limits. Dense carotid  atherosclerosis. Left-sided thyroid enlargement, likely due to multinodular goiter. No apical pneumothorax.  Multilevel spondylosis, resulting in areas of central canal and bilateral neural foraminal narrowing.  Skull base intact. Maintenance of vertebral body height. Straightening of expected cervical lordosis. Facets are well-aligned. Prominent posterior osteophytes at C5-6. C1-2 demonstrates only degenerative change on coronal reformatted images.  IMPRESSION: 1.  No acute intracranial abnormality. 2.  Cerebral atrophy and small vessel ischemic change. 3. Sinus disease. 4. No cervical spine acute fracture or subluxation; advanced spondylosis. 5. Straightening of expected cervical lordosis could be positional, due to muscular spasm, or ligamentous injury.   Electronically Signed   By: Abigail Miyamoto M.D.   On: 09/27/2014 20:59   Dg Shoulder Left  09/27/2014   CLINICAL DATA:  Pain after falling  EXAM: LEFT SHOULDER - 2+ VIEW  COMPARISON:  08/08/2004  FINDINGS: Negative for acute fracture or dislocation. Severe glenohumeral degenerative changes are present. No bone lesion or bony destruction is evident.  IMPRESSION: Negative for acute fracture or dislocation   Electronically Signed   By: Andreas Newport M.D.   On: 09/27/2014 20:57     EKG Interpretation None      MDM   Final diagnoses:  Fall, initial encounter  Contusion, multiple sites    FALL, CAUSE UNCLEAR, WITH NEGATIVE EVALUATION FOR SOURCES OF WEAKNESS. NO SPECIFIC INJURIES OR FRACTURES UNCOVERED ON EVALUATION CLINICALLY OR WITH IMAGING.  Nursing Notes Reviewed/ Care Coordinated Applicable Imaging Reviewed Interpretation of Laboratory Data incorporated into ED treatment  The patient appears reasonably screened and/or stabilized for discharge and I doubt any other medical condition or other [facility] requiring further screening, evaluation, or treatment in the ED at this time prior to discharge.  Plan: Home Medications- Tylenol prn; Home  Treatments- rest; return here if the recommended treatment, does not improve the symptoms; Recommended follow up- PCP 1 week    Daleen Bo, MD 09/27/14 2251

## 2014-09-29 LAB — URINE CULTURE
Colony Count: NO GROWTH
Culture: NO GROWTH
SPECIAL REQUESTS: NORMAL

## 2014-12-11 ENCOUNTER — Emergency Department (HOSPITAL_COMMUNITY)
Admission: EM | Admit: 2014-12-11 | Discharge: 2014-12-12 | Disposition: A | Discharge status: Home / Self Care | Payer: Medicare Other | Attending: Emergency Medicine | Admitting: Emergency Medicine

## 2014-12-11 ENCOUNTER — Encounter (HOSPITAL_COMMUNITY): Payer: Self-pay

## 2014-12-11 ENCOUNTER — Emergency Department (HOSPITAL_COMMUNITY): Payer: Medicare Other

## 2014-12-11 DIAGNOSIS — G4733 Obstructive sleep apnea (adult) (pediatric): Secondary | ICD-10-CM | POA: Insufficient documentation

## 2014-12-11 DIAGNOSIS — I252 Old myocardial infarction: Secondary | ICD-10-CM | POA: Diagnosis not present

## 2014-12-11 DIAGNOSIS — E119 Type 2 diabetes mellitus without complications: Secondary | ICD-10-CM | POA: Insufficient documentation

## 2014-12-11 DIAGNOSIS — I509 Heart failure, unspecified: Secondary | ICD-10-CM | POA: Diagnosis not present

## 2014-12-11 DIAGNOSIS — M1990 Unspecified osteoarthritis, unspecified site: Secondary | ICD-10-CM | POA: Insufficient documentation

## 2014-12-11 DIAGNOSIS — I2510 Atherosclerotic heart disease of native coronary artery without angina pectoris: Secondary | ICD-10-CM | POA: Diagnosis not present

## 2014-12-11 DIAGNOSIS — Z853 Personal history of malignant neoplasm of breast: Secondary | ICD-10-CM | POA: Diagnosis not present

## 2014-12-11 DIAGNOSIS — R079 Chest pain, unspecified: Secondary | ICD-10-CM | POA: Diagnosis not present

## 2014-12-11 DIAGNOSIS — Z9861 Coronary angioplasty status: Secondary | ICD-10-CM | POA: Diagnosis not present

## 2014-12-11 DIAGNOSIS — I4891 Unspecified atrial fibrillation: Secondary | ICD-10-CM | POA: Insufficient documentation

## 2014-12-11 DIAGNOSIS — Z9981 Dependence on supplemental oxygen: Secondary | ICD-10-CM | POA: Diagnosis not present

## 2014-12-11 DIAGNOSIS — F0390 Unspecified dementia without behavioral disturbance: Secondary | ICD-10-CM | POA: Insufficient documentation

## 2014-12-11 DIAGNOSIS — E669 Obesity, unspecified: Secondary | ICD-10-CM | POA: Diagnosis not present

## 2014-12-11 DIAGNOSIS — G8929 Other chronic pain: Secondary | ICD-10-CM | POA: Diagnosis not present

## 2014-12-11 DIAGNOSIS — K219 Gastro-esophageal reflux disease without esophagitis: Secondary | ICD-10-CM | POA: Diagnosis not present

## 2014-12-11 DIAGNOSIS — Z79899 Other long term (current) drug therapy: Secondary | ICD-10-CM | POA: Diagnosis not present

## 2014-12-11 DIAGNOSIS — E785 Hyperlipidemia, unspecified: Secondary | ICD-10-CM | POA: Diagnosis not present

## 2014-12-11 DIAGNOSIS — Z87442 Personal history of urinary calculi: Secondary | ICD-10-CM | POA: Diagnosis not present

## 2014-12-11 DIAGNOSIS — R0602 Shortness of breath: Secondary | ICD-10-CM | POA: Diagnosis not present

## 2014-12-11 DIAGNOSIS — F419 Anxiety disorder, unspecified: Secondary | ICD-10-CM | POA: Insufficient documentation

## 2014-12-11 DIAGNOSIS — Z7982 Long term (current) use of aspirin: Secondary | ICD-10-CM | POA: Diagnosis not present

## 2014-12-11 DIAGNOSIS — K644 Residual hemorrhoidal skin tags: Secondary | ICD-10-CM | POA: Insufficient documentation

## 2014-12-11 DIAGNOSIS — I10 Essential (primary) hypertension: Secondary | ICD-10-CM | POA: Insufficient documentation

## 2014-12-11 DIAGNOSIS — Z791 Long term (current) use of non-steroidal anti-inflammatories (NSAID): Secondary | ICD-10-CM | POA: Insufficient documentation

## 2014-12-11 DIAGNOSIS — K649 Unspecified hemorrhoids: Secondary | ICD-10-CM

## 2014-12-11 LAB — I-STAT TROPONIN, ED
TROPONIN I, POC: 0.01 ng/mL (ref 0.00–0.08)
TROPONIN I, POC: 0 ng/mL (ref 0.00–0.08)

## 2014-12-11 LAB — BASIC METABOLIC PANEL
ANION GAP: 8 (ref 5–15)
BUN: 11 mg/dL (ref 6–20)
CO2: 23 mmol/L (ref 22–32)
Calcium: 9.3 mg/dL (ref 8.9–10.3)
Chloride: 106 mmol/L (ref 101–111)
Creatinine, Ser: 0.88 mg/dL (ref 0.44–1.00)
GFR calc Af Amer: >60 mL/min (ref >60)
GFR, EST NON AFRICAN AMERICAN: 58 mL/min — AB (ref >60)
Glucose, Bld: 117 mg/dL — ABNORMAL HIGH (ref 65–99)
Potassium: 3.2 mmol/L — ABNORMAL LOW (ref 3.5–5.1)
SODIUM: 137 mmol/L (ref 135–145)

## 2014-12-11 LAB — CBC
HEMATOCRIT: 32.3 % — AB (ref 36.0–46.0)
HEMOGLOBIN: 10.8 g/dL — AB (ref 12.0–15.0)
MCH: 28.6 pg (ref 26.0–34.0)
MCHC: 33.4 g/dL (ref 30.0–36.0)
MCV: 85.7 fL (ref 78.0–100.0)
PLATELETS: 235 10*3/uL (ref 150–400)
RBC: 3.77 MIL/uL — ABNORMAL LOW (ref 3.87–5.11)
RDW: 13.9 % (ref 11.5–15.5)
WBC: 4 10*3/uL (ref 4.0–10.5)

## 2014-12-11 MED ORDER — HYDROCORTISONE ACETATE 25 MG RE SUPP
25.0000 mg | Freq: Once | RECTAL | Status: AC
  Administered 2014-12-11: 25 mg via RECTAL
  Filled 2014-12-11 (×2): qty 1

## 2014-12-11 NOTE — ED Provider Notes (Signed)
CSN: [id number]     Arrival date & time 12/11/14  1922 History   First MD Initiated Contact with Patient 12/11/14 2020     Chief Complaint  Patient presents with  . Chest Pain  . Hemorrhoids     (Consider location/radiation/quality/duration/timing/severity/associated sxs/prior Treatment) HPI Comments: Patient is am 81 yo F PMHx significant for dementia, CAD, HTN, HLD, CHF, MI s/p stent placement, Cardiomyopathy, A fib sharp central chest pain with radiation to her left arm that started last evening with assosciated SOB. Patient is unclear how long the pain lasted for, but states it started just before dinner. No modifying factors identified. Her major and playing his worsening hemorrhoid pain. Patient states she was initially constipated, on MiraLAX last dose Monday. She states she has used suppositories with little to no relief.  Patient is a 81 y.o. female presenting with chest pain.  Chest Pain Associated symptoms: shortness of breath     Past Medical History  Diagnosis Date  . Coronary artery disease   . Dementia   . Hypertension   . Hyperlipidemia   . Anxiety   . GERD (gastroesophageal reflux disease)   . CHF (congestive heart failure)   . Polyp, stomach 11/11/2006  . Aphakia of left eye   . Obese   . MI (myocardial infarction)   . Chest pain at rest 12/20/2011  . CAD (coronary artery disease), cutting balloon athrectomy of her dominant AV groove of LCX.  2007 12/20/2011  . Bradycardia 12/20/2011  . HTN (hypertension) 12/20/2011  . Dyslipidemia  12/20/2011  . Cardiomyopathy   . History of GI bleed   . Atrial fibrillation   . Sigmoid diverticulosis   . Anemia, iron deficiency 10/24/2012  . Constipation due to pain medication 10/24/2012  . Cataract     "coming back in both eyes" (06/19/2013)  . Chronic bronchitis     "I have it all year round"  . Shortness of breath     "comes on at anytime" (06/19/2013)  . OSA on CPAP 12/20/2011  . Chronic back pain     "all over my back"    . Breast cancer, right breast   . Type II diabetes mellitus   . Headache     "right often" (08/09/2014)  . DJD (degenerative joint disease)   . Osteoarthritis   . Arthritis     "all over my body"  . Nephrolithiasis     bilateral   Past Surgical History  Procedure Laterality Date  . Back surgery    . Appendectomy    . Fracture surgery    . Dilation and curettage of uterus    . Esophagogastroduodenoscopy    . Colonoscopy    . Eus    . Esophagogastroduodenoscopy  02/21/2012    Procedure: ESOPHAGOGASTRODUODENOSCOPY (EGD);  Surgeon: Gatha Mayer, MD;  Location: [facility];  Service: Endoscopy;  Laterality: N/A;  pt. being evaluated for a pacemaker  . Balloon dilation  02/21/2012    Procedure: BALLOON DILATION;  Surgeon: Gatha Mayer, MD;  Location: [facility];  Service: Endoscopy;  Laterality: N/A;  . Colonoscopy N/A 10/24/2012    Procedure: COLONOSCOPY;  Surgeon: Gatha Mayer, MD;  Location: [facility];  Service: Endoscopy;  Laterality: N/A;  . US echocardiography  01/25/2008    mild DUST,mild MR,mod. ca+ mitral annular,AOV mildly sclerotid  . Lexiscan mycardial perfusion scan  01/25/2008    Normal  . Inguinal hernia repair Right 06/18/2013  . Tonsillectomy    . Total abdominal  hysterectomy    . Cataract extraction w/ intraocular lens  implant, bilateral Bilateral   . Breast biopsy Right   . Breast lumpectomy Right   . Inguinal hernia repair Right 06/18/2013    Procedure: HERNIA REPAIR INGUINAL ADULT;  Surgeon: Gwenyth Ober, MD;  Location: [place];  Service: General;  Laterality: Right;  . Insertion of mesh Right 06/18/2013    Procedure: INSERTION OF MESH;  Surgeon: Gwenyth Ober, MD;  Location: [place];  Service: General;  Laterality: Right;  . Left heart catheterization with coronary angiogram N/A 12/21/2011    Procedure: LEFT HEART CATHETERIZATION WITH CORONARY ANGIOGRAM;  Surgeon: Lorretta Harp, MD;  Location: [facility] C[facility];  Service: Cardiovascular;  Laterality: N/A;   . Glaucoma surgery Bilateral   . Eye surgery    . Coronary angioplasty with stent placement      "Dr. Alvester Chou put it in; it's been in a long time"  . Coronary angioplasty  03-07-2004    cutting balloon  . Cardiac catheterization  06/05/2004    high grade disease AV groove CX  . Cardiac catheterization  04/19/2005    noncritical CAD   Family History  Problem Relation Age of Onset  . Colon cancer Mother   . Heart disease Mother   . Stroke Mother    History  Substance Use Topics  . Smoking status: Never Smoker   . Smokeless tobacco: Never Used  . Alcohol Use: No   OB History    No data available     Review of Systems  Respiratory: Positive for shortness of breath.   Cardiovascular: Positive for chest pain and leg swelling (chronic).  Gastrointestinal: Positive for anal bleeding.       Hemorrhoids  All other systems reviewed and are negative.     Allergies  Chicken allergy; Fish allergy; and Percocet  Home Medications   Prior to Admission medications   Medication Sig Start Date End Date Taking? Authorizing Provider  acetaminophen (TYLENOL) 500 MG tablet Take 500 mg by mouth 3 (three) times daily.    Historical Provider, MD  amLODipine (NORVASC) 10 MG tablet Take 10 mg by mouth daily.    Historical Provider, MD  aspirin 81 MG chewable tablet Chew 81 mg by mouth daily.    Historical Provider, MD  bisacodyl (DULCOLAX) 10 MG suppository Place 1 suppository (10 mg total) rectally daily as needed (no bowel movement in 24 hours). 08/12/14   Janece Canterbury, MD  cephALEXin (KEFLEX) 500 MG capsule Take 1 capsule (500 mg total) by mouth 3 (three) times daily. Patient not taking: Reported on 09/27/2014 09/14/14   Larene Pickett, PA-C  cetirizine (ZYRTEC) 5 MG tablet Take 5 mg by mouth every morning.     Historical Provider, MD  cholecalciferol (VITAMIN D) 400 UNITS TABS tablet Take 800 Units by mouth daily.    Historical Provider, MD  citalopram (CELEXA) 20 MG tablet Take 20 mg by  mouth daily before breakfast.     Historical Provider, MD  docusate sodium (COLACE) 100 MG capsule Take 100 mg by mouth 2 (two) times daily.    Historical Provider, MD  esomeprazole (NEXIUM) 40 MG capsule Take 40 mg by mouth daily before breakfast.    Historical Provider, MD  fluticasone (FLONASE) 50 MCG/ACT nasal spray Place 2 sprays into both nostrils daily.     Historical Provider, MD  hydrALAZINE (APRESOLINE) 25 MG tablet Take 25 mg by mouth 3 (three) times daily.    Historical Provider, MD  isosorbide mononitrate (IMDUR) 60 MG 24 hr tablet Take 1 tablet (60 mg total) by mouth daily before breakfast. 11/16/12   Isaiah Serge, NP  ketorolac (ACULAR) 0.5 % ophthalmic solution Place 1 drop into both eyes daily.    Historical Provider, MD  losartan-hydrochlorothiazide (HYZAAR) 100-25 MG per tablet Take 1 tablet by mouth daily before breakfast.     Historical Provider, MD  lubiprostone (AMITIZA) 24 MCG capsule Take 1 capsule (24 mcg total) by mouth 2 (two) times daily with a meal. 10/24/12   Gatha Mayer, MD  mirabegron ER (MYRBETRIQ) 25 MG TB24 tablet Take 25 mg by mouth daily.    Historical Provider, MD  nitroGLYCERIN (NITROLINGUAL) 0.4 MG/SPRAY spray Place 2 sprays under the tongue every 5 (five) minutes as needed. Use 2 sprays under tongue as needed for chest pain. Repeat 2 sprays if pain persists after 15 minutes if pain still persists    Historical Provider, MD  Olopatadine HCl (PATADAY) 0.2 % SOLN Place 1 drop into both eyes daily.     Historical Provider, MD  polyethylene glycol (MIRALAX / GLYCOLAX) packet Take 17 g by mouth 3 (three) times daily. Mix in 8 oz of suitable liquid and drink by mouth every day Patient taking differently: Take 17 g by mouth daily. Mix in 8 oz of suitable liquid and drink by mouth every day 08/12/14   Janece Canterbury, MD  pramoxine (PROCTOFOAM) 1 % foam Place 1 application rectally 3 (three) times daily as needed for itching. 12/12/14   Murl Zogg, PA-C   PROCTOZONE-HC 2.5 % rectal cream Apply 1 application topically as needed. 08/15/14   Historical Provider, MD  rosuvastatin (CRESTOR) 5 MG tablet Take 5 mg by mouth at bedtime.     Historical Provider, MD  senna (SENOKOT) 8.6 MG TABS Take 1 tablet by mouth 2 (two) times daily.    Historical Provider, MD  spironolactone (ALDACTONE) 25 MG tablet Take 25 mg by mouth daily before breakfast.     Historical Provider, MD  starch (ANUSOL) 51 % suppository Place 1 suppository rectally as needed for pain. 12/12/14   Latasha Buczkowski, PA-C  tamsulosin (FLOMAX) 0.4 MG CAPS capsule Take 1 capsule (0.4 mg total) by mouth daily. 08/12/14   Janece Canterbury, MD   BP 201/64 mmHg  Pulse 62  Temp(Src) 98.6 F (37 C) (Oral)  Resp 21  Ht 5\' 6"  (1.676 m)  Wt 155 lb (70.308 kg)  BMI 25.03 kg/m2  SpO2 97% Physical Exam  Constitutional: She is oriented to person, place, and time. She appears well-developed and well-nourished. No distress.  HENT:  Head: Normocephalic and atraumatic.  Right Ear: External ear normal.  Left Ear: External ear normal.  Nose: Nose normal.  Eyes: Conjunctivae are normal.  Neck: Neck supple.  Cardiovascular: Normal rate and normal heart sounds.  An irregularly irregular rhythm present.  Pulmonary/Chest: Effort normal and breath sounds normal.  Abdominal: Soft. There is no tenderness.  Genitourinary: Rectal exam shows external hemorrhoid.  Brown stool noted   Musculoskeletal: She exhibits edema (2+ bilateral ankles & feet).  Neurological: She is alert and oriented to person, place, and time.  Skin: Skin is warm and dry. She is not diaphoretic.  Nursing note and vitals reviewed.   ED Course  Procedures (including critical care time) Medications  hydrocortisone (ANUSOL-HC) suppository 25 mg (25 mg Rectal Given 12/11/14 2140)  potassium chloride SA (K-DUR,KLOR-CON) CR tablet 40 mEq (40 mEq Oral Given 12/12/14 0120)    Labs Review Labs  Reviewed  CBC - Abnormal; Notable for the  following:    RBC 3.77 (*)    Hemoglobin 10.8 (*)    HCT 32.3 (*)    All other components within normal limits  BASIC METABOLIC PANEL - Abnormal; Notable for the following:    Potassium 3.2 (*)    Glucose, Bld 117 (*)    GFR calc non Af Amer 58 (*)    All other components within normal limits  BRAIN NATRIURETIC PEPTIDE  I-STAT TROPOININ, ED  Randolm Idol, ED    Imaging Review Dg Chest 2 View  12/11/2014   CLINICAL DATA:  Chest pain for 1 day  EXAM: CHEST  2 VIEW  COMPARISON:  08/09/2014  FINDINGS: Mild cardiac enlargement. No pulmonary vascular congestion. No focal airspace disease or consolidation in the lungs. No blunting of costophrenic angles. No pneumothorax. Calcified and tortuous aorta. Prominent soft tissue at the thoracic inlet may represent enlarged thyroid gland. No significant changes since previous study. Degenerative changes in the spine and shoulders.  IMPRESSION: Mild cardiac enlargement.  No evidence of active pulmonary disease.   Electronically Signed   By: Lucienne Capers M.D.   On: 12/11/2014 21:33     EKG Interpretation   Date/Time:  Wednesday December 11 2014 19:31:08 EDT Ventricular Rate:  67 PR Interval:  172 QRS Duration: 100 QT Interval:  429 QTC Calculation: 453 R Axis:   -23 Text Interpretation:  Sinus rhythm Atrial premature complex Borderline  left axis deviation Minimal ST depression, inferior leads Confirmed by  DOCHERTY  MD, MEGAN [phone]) on 12/11/2014 7:35:04 PM      Dr. Claiborne Billings consulted who feels delta troponin and outpatient follow up is appropriate for patient given recent stress test within last 4 months.   MDM   Final diagnoses:  Chest pain, unspecified chest pain type  Hemorrhoids, unspecified hemorrhoid type    Filed Vitals:   12/12/14 0100  BP: 201/64  Pulse: 62  Temp:   Resp: 21    Afebrile, NAD, non-toxic appearing, AAOx4.   I have reviewed nursing notes, vital signs, and all appropriate lab and imaging results if  ordered as above.   1) CP: Patient is to be discharged with recommendation to follow up with PCP in regards to today's hospital visit.  VSS, no tracheal deviation, no JVD or new murmur, RRR, breath sounds equal bilaterally, EKG without acute abnormalities, negative delta troponin, and negative CXR.    2) Hemorrhoid: Hemorrhoids  Pt to ER e external hemorrhoids. Home care discussed including sitz baths 15 min TID. Dc w Anusol, stool softener and recommendation for surgery f-u. Pt w normal VS and in NAD prior to dc.  Case has been discussed with and seen by Dr. Tawnya Crook who agrees with the above plan to discharge.   Baron Sane, PA-C 12/12/14 [id number]  Ernestina Patches, MD 12/14/14 1109

## 2014-12-11 NOTE — ED Notes (Signed)
Pt come from [facility] with c/o sharp CP on left side of chest that started last night radiates to left arm, denies other cardiac symptoms, pt also c/o hemorrhoids pain, pt denies CP at this time.

## 2014-12-12 DIAGNOSIS — R079 Chest pain, unspecified: Secondary | ICD-10-CM | POA: Diagnosis not present

## 2014-12-12 LAB — BRAIN NATRIURETIC PEPTIDE: B Natriuretic Peptide: 21.7 pg/mL (ref 0.0–100.0)

## 2014-12-12 MED ORDER — PRAMOXINE HCL 1 % RE FOAM: 1.0000 "application " | Freq: Three times a day (TID) | RECTAL | Status: DC | PRN

## 2014-12-12 MED ORDER — POTASSIUM CHLORIDE CRYS ER 20 MEQ PO TBCR
40.0000 meq | EXTENDED_RELEASE_TABLET | Freq: Once | ORAL | Status: AC
  Administered 2014-12-12: 40 meq via ORAL
  Filled 2014-12-12: qty 2

## 2014-12-12 MED ORDER — STARCH 51 % RE SUPP: 1.0000 | RECTAL | Status: DC | PRN

## 2014-12-12 NOTE — ED Notes (Signed)
[facility] called  

## 2014-12-12 NOTE — Discharge Instructions (Signed)
Please follow up with your primary care physician in 1-2 days. If you do not have one please call the [place] number listed above. Please read all discharge instructions and return precautions.    Chest Pain (Nonspecific) It is often hard to give a specific diagnosis for the cause of chest pain. There is always a chance that your pain could be related to something serious, such as a heart attack or a blood clot in the lungs. You need to follow up with your health care provider for further evaluation. CAUSES   Heartburn.  Pneumonia or bronchitis.  Anxiety or stress.  Inflammation around your heart (pericarditis) or lung (pleuritis or pleurisy).  A blood clot in the lung.  A collapsed lung (pneumothorax). It can develop suddenly on its own (spontaneous pneumothorax) or from trauma to the chest.  Shingles infection (herpes zoster virus). The chest wall is composed of bones, muscles, and cartilage. Any of these can be the source of the pain.  The bones can be bruised by injury.  The muscles or cartilage can be strained by coughing or overwork.  The cartilage can be affected by inflammation and become sore (costochondritis). DIAGNOSIS  Lab tests or other studies may be needed to find the cause of your pain. Your health care provider may have you take a test called an ambulatory electrocardiogram (ECG). An ECG records your heartbeat patterns over a 24-hour period. You may also have other tests, such as:  Transthoracic echocardiogram (TTE). During echocardiography, sound waves are used to evaluate how blood flows through your heart.  Transesophageal echocardiogram (TEE).  Cardiac monitoring. This allows your health care provider to monitor your heart rate and rhythm in real time.  Holter monitor. This is a portable device that records your heartbeat and can help diagnose heart arrhythmias. It allows your health care provider to track your heart activity for  several days, if needed.  Stress tests by exercise or by giving medicine that makes the heart beat faster. TREATMENT   Treatment depends on what may be causing your chest pain. Treatment may include:  Acid blockers for heartburn.  Anti-inflammatory medicine.  Pain medicine for inflammatory conditions.  Antibiotics if an infection is present.  You may be advised to change lifestyle habits. This includes stopping smoking and avoiding alcohol, caffeine, and chocolate.  You may be advised to keep your head raised (elevated) when sleeping. This reduces the chance of acid going backward from your stomach into your esophagus. Most of the time, nonspecific chest pain will improve within 2-3 days with rest and mild pain medicine.  HOME CARE INSTRUCTIONS   If antibiotics were prescribed, take them as directed. Finish them even if you start to feel better.  For the next few days, avoid physical activities that bring on chest pain. Continue physical activities as directed.  Do not use any tobacco products, including cigarettes, chewing tobacco, or electronic cigarettes.  Avoid drinking alcohol.  Only take medicine as directed by your health care provider.  Follow your health care provider's suggestions for further testing if your chest pain does not go away.  Keep any follow-up appointments you made. If you do not go to an appointment, you could develop lasting (chronic) problems with pain. If there is any problem keeping an appointment, call to reschedule. SEEK MEDICAL CARE IF:   Your chest pain does not go away, even after treatment.  You have a rash with blisters on your chest.  You have a fever.  SEEK IMMEDIATE MEDICAL CARE IF:   You have increased chest pain or pain that spreads to your arm, neck, jaw, back, or abdomen.  You have shortness of breath.  You have an increasing cough, or you cough up blood.  You have severe back or abdominal pain.  You feel nauseous or  vomit.  You have severe weakness.  You faint.  You have chills. This is an emergency. Do not wait to see if the pain will go away. Get medical help at once. Call your local emergency services (911 in [place] Do not drive yourself to the hospital. MAKE SURE YOU:   Understand these instructions.  Will watch your condition.  Will get help right away if you are not doing well or get worse. Document Released: 03/24/2005 Document Revised: 06/19/2013 Document Reviewed: 01/18/2008 [facility] Patient Information 2015 [place], [place]. This information is not intended to replace advice given to you by your health care provider. Make sure you discuss any questions you have with your health care provider.  Hemorrhoids Hemorrhoids are swollen veins around the rectum or anus. There are two types of hemorrhoids:   Internal hemorrhoids. These occur in the veins just inside the rectum. They may poke through to the outside and become irritated and painful.  External hemorrhoids. These occur in the veins outside the anus and can be felt as a painful swelling or hard lump near the anus. CAUSES  Pregnancy.   Obesity.   Constipation or diarrhea.   Straining to have a bowel movement.   Sitting for long periods on the toilet.  Heavy lifting or other activity that caused you to strain.  Anal intercourse. SYMPTOMS   Pain.   Anal itching or irritation.   Rectal bleeding.   Fecal leakage.   Anal swelling.   One or more lumps around the anus.  DIAGNOSIS  Your caregiver may be able to diagnose hemorrhoids by visual examination. Other examinations or tests that may be performed include:   Examination of the rectal area with a gloved hand (digital rectal exam).   Examination of anal canal using a small tube (scope).   A blood test if you have lost a significant amount of blood.  A test to look inside the colon (sigmoidoscopy or colonoscopy). TREATMENT Most hemorrhoids can be  treated at home. However, if symptoms do not seem to be getting better or if you have a lot of rectal bleeding, your caregiver may perform a procedure to help make the hemorrhoids get smaller or remove them completely. Possible treatments include:   Placing a rubber band at the base of the hemorrhoid to cut off the circulation (rubber band ligation).   Injecting a chemical to shrink the hemorrhoid (sclerotherapy).   Using a tool to burn the hemorrhoid (infrared light therapy).   Surgically removing the hemorrhoid (hemorrhoidectomy).   Stapling the hemorrhoid to block blood flow to the tissue (hemorrhoid stapling).  HOME CARE INSTRUCTIONS   Eat foods with fiber, such as whole grains, beans, nuts, fruits, and vegetables. Ask your doctor about taking products with added fiber in them (fibersupplements).  Increase fluid intake. Drink enough water and fluids to keep your urine clear or pale yellow.   Exercise regularly.   Go to the bathroom when you have the urge to have a bowel movement. Do not wait.   Avoid straining to have bowel movements.   Keep the anal area dry and clean. Use wet toilet paper or moist towelettes after a bowel movement.  Medicated creams and suppositories may be used or applied as directed.   Only take over-the-counter or prescription medicines as directed by your caregiver.   Take warm sitz baths for 15-20 minutes, 3-4 times a day to ease pain and discomfort.   Place ice packs on the hemorrhoids if they are tender and swollen. Using ice packs between sitz baths may be helpful.   Put ice in a plastic bag.   Place a towel between your skin and the bag.   Leave the ice on for 15-20 minutes, 3-4 times a day.   Do not use a donut-shaped pillow or sit on the toilet for long periods. This increases blood pooling and pain.  SEEK MEDICAL CARE IF:  You have increasing pain and swelling that is not controlled by treatment or medicine.  You have  uncontrolled bleeding.  You have difficulty or you are unable to have a bowel movement.  You have pain or inflammation outside the area of the hemorrhoids. MAKE SURE YOU:  Understand these instructions.  Will watch your condition.  Will get help right away if you are not doing well or get worse. Document Released: 06/11/2000 Document Revised: 05/31/2012 Document Reviewed: 04/18/2012 [facility] Patient Information 2015 [place], [place]. This information is not intended to replace advice given to you by your health care provider. Make sure you discuss any questions you have with your health care provider.

## 2015-02-02 ENCOUNTER — Emergency Department (HOSPITAL_COMMUNITY)
Admission: EM | Admit: 2015-02-02 | Discharge: 2015-02-02 | Disposition: A | Discharge status: Home / Self Care | Payer: Medicare Other | Attending: Emergency Medicine | Admitting: Emergency Medicine

## 2015-02-02 ENCOUNTER — Encounter (HOSPITAL_COMMUNITY): Payer: Self-pay | Admitting: Emergency Medicine

## 2015-02-02 DIAGNOSIS — Z7951 Long term (current) use of inhaled steroids: Secondary | ICD-10-CM | POA: Diagnosis not present

## 2015-02-02 DIAGNOSIS — Z9889 Other specified postprocedural states: Secondary | ICD-10-CM | POA: Insufficient documentation

## 2015-02-02 DIAGNOSIS — Z862 Personal history of diseases of the blood and blood-forming organs and certain disorders involving the immune mechanism: Secondary | ICD-10-CM | POA: Insufficient documentation

## 2015-02-02 DIAGNOSIS — F0390 Unspecified dementia without behavioral disturbance: Secondary | ICD-10-CM | POA: Diagnosis not present

## 2015-02-02 DIAGNOSIS — I4891 Unspecified atrial fibrillation: Secondary | ICD-10-CM | POA: Insufficient documentation

## 2015-02-02 DIAGNOSIS — G8929 Other chronic pain: Secondary | ICD-10-CM | POA: Insufficient documentation

## 2015-02-02 DIAGNOSIS — I10 Essential (primary) hypertension: Secondary | ICD-10-CM | POA: Diagnosis not present

## 2015-02-02 DIAGNOSIS — Z79899 Other long term (current) drug therapy: Secondary | ICD-10-CM | POA: Diagnosis not present

## 2015-02-02 DIAGNOSIS — E669 Obesity, unspecified: Secondary | ICD-10-CM | POA: Insufficient documentation

## 2015-02-02 DIAGNOSIS — Z9981 Dependence on supplemental oxygen: Secondary | ICD-10-CM | POA: Diagnosis not present

## 2015-02-02 DIAGNOSIS — K6289 Other specified diseases of anus and rectum: Secondary | ICD-10-CM | POA: Insufficient documentation

## 2015-02-02 DIAGNOSIS — K219 Gastro-esophageal reflux disease without esophagitis: Secondary | ICD-10-CM | POA: Diagnosis not present

## 2015-02-02 DIAGNOSIS — K5900 Constipation, unspecified: Secondary | ICD-10-CM | POA: Diagnosis not present

## 2015-02-02 DIAGNOSIS — E119 Type 2 diabetes mellitus without complications: Secondary | ICD-10-CM | POA: Insufficient documentation

## 2015-02-02 DIAGNOSIS — M1990 Unspecified osteoarthritis, unspecified site: Secondary | ICD-10-CM | POA: Insufficient documentation

## 2015-02-02 DIAGNOSIS — I509 Heart failure, unspecified: Secondary | ICD-10-CM | POA: Insufficient documentation

## 2015-02-02 DIAGNOSIS — G4733 Obstructive sleep apnea (adult) (pediatric): Secondary | ICD-10-CM | POA: Insufficient documentation

## 2015-02-02 DIAGNOSIS — Z9861 Coronary angioplasty status: Secondary | ICD-10-CM | POA: Diagnosis not present

## 2015-02-02 DIAGNOSIS — I2510 Atherosclerotic heart disease of native coronary artery without angina pectoris: Secondary | ICD-10-CM | POA: Diagnosis not present

## 2015-02-02 DIAGNOSIS — Z7982 Long term (current) use of aspirin: Secondary | ICD-10-CM | POA: Insufficient documentation

## 2015-02-02 DIAGNOSIS — E785 Hyperlipidemia, unspecified: Secondary | ICD-10-CM | POA: Insufficient documentation

## 2015-02-02 DIAGNOSIS — F419 Anxiety disorder, unspecified: Secondary | ICD-10-CM | POA: Insufficient documentation

## 2015-02-02 DIAGNOSIS — Z87442 Personal history of urinary calculi: Secondary | ICD-10-CM | POA: Insufficient documentation

## 2015-02-02 NOTE — ED Provider Notes (Signed)
CSN: [id number]     Arrival date & time 02/02/15  0941 History   First MD Initiated Contact with Patient 02/02/15 [phone]     Chief Complaint  Patient presents with  . Fall     (Consider location/radiation/quality/duration/timing/severity/associated sxs/prior Treatment) Patient is a 81 y.o. female presenting with fall. The history is provided by the patient, the EMS personnel and the nursing home.  Fall This is a recurrent problem. The current episode started 1 to 2 hours ago. The problem occurs constantly. The problem has not changed since onset.Pertinent negatives include no chest pain, no abdominal pain, no headaches and no shortness of breath. Nothing aggravates the symptoms. Nothing relieves the symptoms. She has tried nothing for the symptoms. The treatment provided no relief.   Patient is a 81 y.o. female who presents with rectal pain.  This started at least 6 months ago.  Patient states that she has had pain at her rectum and it must be her hemorrhoids.  Blood on toilet paper when wiping.   Reported from nursing home that patient might have fallen.  Patient adamantly denies this.   Past Medical History  Diagnosis Date  . Coronary artery disease   . Dementia   . Hypertension   . Hyperlipidemia   . Anxiety   . GERD (gastroesophageal reflux disease)   . CHF (congestive heart failure)   . Polyp, stomach 11/11/2006  . Aphakia of left eye   . Obese   . MI (myocardial infarction)   . Chest pain at rest 12/20/2011  . CAD (coronary artery disease), cutting balloon athrectomy of her dominant AV groove of LCX.  2007 12/20/2011  . Bradycardia 12/20/2011  . HTN (hypertension) 12/20/2011  . Dyslipidemia  12/20/2011  . Cardiomyopathy   . History of GI bleed   . Atrial fibrillation   . Sigmoid diverticulosis   . Anemia, iron deficiency 10/24/2012  . Constipation due to pain medication 10/24/2012  . Cataract     "coming back in both eyes" (06/19/2013)  . Chronic bronchitis     "I have it all  year round"  . Shortness of breath     "comes on at anytime" (06/19/2013)  . OSA on CPAP 12/20/2011  . Chronic back pain     "all over my back"   . Breast cancer, right breast   . Type II diabetes mellitus   . Headache     "right often" (08/09/2014)  . DJD (degenerative joint disease)   . Osteoarthritis   . Arthritis     "all over my body"  . Nephrolithiasis     bilateral   Past Surgical History  Procedure Laterality Date  . Back surgery    . Appendectomy    . Fracture surgery    . Dilation and curettage of uterus    . Esophagogastroduodenoscopy    . Colonoscopy    . Eus    . Esophagogastroduodenoscopy  02/21/2012    Procedure: ESOPHAGOGASTRODUODENOSCOPY (EGD);  Surgeon: Gatha Mayer, MD;  Location: [facility];  Service: Endoscopy;  Laterality: N/A;  pt. being evaluated for a pacemaker  . Balloon dilation  02/21/2012    Procedure: BALLOON DILATION;  Surgeon: Gatha Mayer, MD;  Location: [facility];  Service: Endoscopy;  Laterality: N/A;  . Colonoscopy N/A 10/24/2012    Procedure: COLONOSCOPY;  Surgeon: Gatha Mayer, MD;  Location: [facility];  Service: Endoscopy;  Laterality: N/A;  . US echocardiography  01/25/2008    mild DUST,mild MR,mod. ca+ mitral  annular,AOV mildly sclerotid  . Lexiscan mycardial perfusion scan  01/25/2008    Normal  . Inguinal hernia repair Right 06/18/2013  . Tonsillectomy    . Total abdominal hysterectomy    . Cataract extraction w/ intraocular lens  implant, bilateral Bilateral   . Breast biopsy Right   . Breast lumpectomy Right   . Inguinal hernia repair Right 06/18/2013    Procedure: HERNIA REPAIR INGUINAL ADULT;  Surgeon: Gwenyth Ober, MD;  Location: [place];  Service: General;  Laterality: Right;  . Insertion of mesh Right 06/18/2013    Procedure: INSERTION OF MESH;  Surgeon: Gwenyth Ober, MD;  Location: [place];  Service: General;  Laterality: Right;  . Left heart catheterization with coronary angiogram N/A 12/21/2011    Procedure: LEFT  HEART CATHETERIZATION WITH CORONARY ANGIOGRAM;  Surgeon: Lorretta Harp, MD;  Location: [facility];  Service: Cardiovascular;  Laterality: N/A;  . Glaucoma surgery Bilateral   . Eye surgery    . Coronary angioplasty with stent placement      "Dr. Alvester Chou put it in; it's been in a long time"  . Coronary angioplasty  03-07-2004    cutting balloon  . Cardiac catheterization  06/05/2004    high grade disease AV groove CX  . Cardiac catheterization  04/19/2005    noncritical CAD   Family History  Problem Relation Age of Onset  . Colon cancer Mother   . Heart disease Mother   . Stroke Mother    History  Substance Use Topics  . Smoking status: Never Smoker   . Smokeless tobacco: Never Used  . Alcohol Use: No   OB History    No data available     Review of Systems  Constitutional: Negative for fever and chills.  HENT: Negative for congestion and rhinorrhea.   Eyes: Negative for redness and visual disturbance.  Respiratory: Negative for shortness of breath and wheezing.   Cardiovascular: Negative for chest pain and palpitations.  Gastrointestinal: Positive for constipation and rectal pain. Negative for nausea, vomiting, abdominal pain and blood in stool.  Genitourinary: Negative for dysuria and urgency.  Musculoskeletal: Negative for myalgias and arthralgias.  Skin: Negative for pallor and wound.  Neurological: Negative for dizziness and headaches.      Allergies  Chicken allergy; Fish allergy; and Percocet  Home Medications   Prior to Admission medications   Medication Sig Start Date End Date Taking? Authorizing Provider  acetaminophen (TYLENOL) 500 MG tablet Take 500 mg by mouth 3 (three) times daily.   Yes Historical Provider, MD  amLODipine (NORVASC) 10 MG tablet Take 10 mg by mouth daily.   Yes Historical Provider, MD  Artificial Tear Ointment (SOOTHE NIGHT TIME) OINT Place 1 application into the right eye 3 (three) times daily.    Yes Historical Provider, MD  aspirin  81 MG chewable tablet Chew 81 mg by mouth daily.   Yes Historical Provider, MD  bisacodyl (DULCOLAX) 10 MG suppository Place 1 suppository (10 mg total) rectally daily as needed (no bowel movement in 24 hours). 08/12/14  Yes Janece Canterbury, MD  cetirizine (ZYRTEC) 5 MG tablet Take 5 mg by mouth every morning.    Yes Historical Provider, MD  cholecalciferol (VITAMIN D) 400 UNITS TABS tablet Take 800 Units by mouth daily.   Yes Historical Provider, MD  citalopram (CELEXA) 20 MG tablet Take 20 mg by mouth daily before breakfast.    Yes Historical Provider, MD  docusate sodium (COLACE) 100 MG capsule Take 100  mg by mouth 2 (two) times daily.   Yes Historical Provider, MD  esomeprazole (NEXIUM) 40 MG capsule Take 40 mg by mouth daily before breakfast.   Yes Historical Provider, MD  fluticasone (FLONASE) 50 MCG/ACT nasal spray Place 2 sprays into both nostrils daily.    Yes Historical Provider, MD  hydrALAZINE (APRESOLINE) 25 MG tablet Take 25 mg by mouth 3 (three) times daily.   Yes Historical Provider, MD  isosorbide mononitrate (IMDUR) 60 MG 24 hr tablet Take 1 tablet (60 mg total) by mouth daily before breakfast. 11/16/12  Yes Isaiah Serge, NP  ketorolac (ACULAR) 0.5 % ophthalmic solution Place 1 drop into both eyes daily.   Yes Historical Provider, MD  losartan-hydrochlorothiazide (HYZAAR) 100-25 MG per tablet Take 1 tablet by mouth daily before breakfast.    Yes Historical Provider, MD  lubiprostone (AMITIZA) 24 MCG capsule Take 1 capsule (24 mcg total) by mouth 2 (two) times daily with a meal. 10/24/12  Yes Gatha Mayer, MD  nitroGLYCERIN (NITROLINGUAL) 0.4 MG/SPRAY spray Place 2 sprays under the tongue every 5 (five) minutes as needed. Use 2 sprays under tongue as needed for chest pain. Repeat 2 sprays if pain persists after 15 minutes if pain still persists   Yes Historical Provider, MD  polyethylene glycol (MIRALAX / GLYCOLAX) packet Take 17 g by mouth 3 (three) times daily. Mix in 8 oz of  suitable liquid and drink by mouth every day Patient taking differently: Take 17 g by mouth daily. Mix in 8 oz of suitable liquid and drink by mouth every day 08/12/14  Yes Janece Canterbury, MD  pramoxine (PROCTOFOAM) 1 % foam Place 1 application rectally 3 (three) times daily as needed for itching. 12/12/14  Yes Jennifer Piepenbrink, PA-C  rosuvastatin (CRESTOR) 5 MG tablet Take 5 mg by mouth at bedtime.    Yes Historical Provider, MD  senna (SENOKOT) 8.6 MG TABS Take 1 tablet by mouth 2 (two) times daily.   Yes Historical Provider, MD  spironolactone (ALDACTONE) 25 MG tablet Take 25 mg by mouth daily before breakfast.    Yes Historical Provider, MD  tamsulosin (FLOMAX) 0.4 MG CAPS capsule Take 1 capsule (0.4 mg total) by mouth daily. 08/12/14  Yes Janece Canterbury, MD  cephALEXin (KEFLEX) 500 MG capsule Take 1 capsule (500 mg total) by mouth 3 (three) times daily. Patient not taking: Reported on 09/27/2014 09/14/14   Larene Pickett, PA-C  Olopatadine HCl (PATADAY) 0.2 % SOLN Place 1 drop into both eyes daily.     Historical Provider, MD  starch (ANUSOL) 51 % suppository Place 1 suppository rectally as needed for pain. Patient not taking: Reported on 02/02/2015 12/12/14   Anderson Malta Piepenbrink, PA-C   BP 176/99 mmHg  Pulse 100  Temp(Src) 97.6 F (36.4 C) (Oral)  Resp 20  Wt 150 lb (68.04 kg)  SpO2 97% Physical Exam  Constitutional: She is oriented to person, place, and time. She appears well-developed and well-nourished. No distress.  HENT:  Head: Normocephalic and atraumatic.  Eyes: EOM are normal. Pupils are equal, round, and reactive to light.  Neck: Normal range of motion. Neck supple.  Cardiovascular: Normal rate and regular rhythm.  Exam reveals no gallop and no friction rub.   No murmur heard. Pulmonary/Chest: Effort normal. She has no wheezes. She has no rales.  Abdominal: Soft. She exhibits no distension. There is no tenderness. There is no rebound and no guarding.  Genitourinary:  No  sign of hemorrhoids, fissures.    Musculoskeletal:  She exhibits no edema or tenderness.  No signs of trauma about body.  No noted bony ttp.  Neurological: She is alert and oriented to person, place, and time.  Skin: Skin is warm and dry. She is not diaphoretic.  Psychiatric: She has a normal mood and affect. Her behavior is normal.    ED Course  Procedures (including critical care time) Labs Review Labs Reviewed - No data to display  Imaging Review No results found.   EKG Interpretation None      MDM   Final diagnoses:  Rectal pain    Patient is a 81 y.o. female who presents with rectal pain.  This started at least 6 months ago.  Non tender on exam, no sign of hemorrhoids, no sign of bleeding.  No signs of trauma.  Will send back to nursing home.  I have discussed the diagnosis/risks/treatment options with the patient and believe the pt to be eligible for discharge home to follow-up with PCP. We also discussed returning to the ED immediately if new or worsening sx occur. We discussed the sx which are most concerning (e.g., repeat fall, confusion) that necessitate immediate return. Medications administered to the patient during their visit and any new prescriptions provided to the patient are listed below.  Medications given during this visit Medications - No data to display  Discharge Medication List as of 02/02/2015 11:14 AM       The patient appears reasonably screen and/or stabilized for discharge and I doubt any other medical condition or other [facility] requiring further screening, evaluation, or treatment in the ED at this time prior to discharge.        Deno Etienne, DO 02/02/15 [phone]

## 2015-02-02 NOTE — ED Notes (Signed)
Bed: WA12 Expected date:  Expected time:  Means of arrival:  Comments: EMS Fall 

## 2015-02-02 NOTE — ED Notes (Signed)
Pt states that she has pain in her rectum and bloody stool from hemorrhoids. She reports falling but denies any pain or injury.

## 2015-02-02 NOTE — ED Notes (Signed)
MD at bedside. 

## 2015-02-02 NOTE — ED Notes (Addendum)
Per EMS, pt is from [place] and was found laying down in the hallway. Believes it may have been an unwitnessed fall. Pt had no pain r/t the fall. AL staff reported pt has hx of laying down in the floor and pretending to have fallen.

## 2015-02-04 ENCOUNTER — Emergency Department (HOSPITAL_COMMUNITY)
Admission: EM | Admit: 2015-02-04 | Discharge: 2015-02-04 | Disposition: A | Discharge status: Skilled Nursing Facility | Payer: Medicare Other | Attending: Emergency Medicine | Admitting: Emergency Medicine

## 2015-02-04 ENCOUNTER — Encounter (HOSPITAL_COMMUNITY): Payer: Self-pay | Admitting: Emergency Medicine

## 2015-02-04 DIAGNOSIS — G8929 Other chronic pain: Secondary | ICD-10-CM | POA: Insufficient documentation

## 2015-02-04 DIAGNOSIS — Z862 Personal history of diseases of the blood and blood-forming organs and certain disorders involving the immune mechanism: Secondary | ICD-10-CM | POA: Insufficient documentation

## 2015-02-04 DIAGNOSIS — I4891 Unspecified atrial fibrillation: Secondary | ICD-10-CM | POA: Diagnosis not present

## 2015-02-04 DIAGNOSIS — M158 Other polyosteoarthritis: Secondary | ICD-10-CM | POA: Insufficient documentation

## 2015-02-04 DIAGNOSIS — K219 Gastro-esophageal reflux disease without esophagitis: Secondary | ICD-10-CM | POA: Diagnosis not present

## 2015-02-04 DIAGNOSIS — Z8709 Personal history of other diseases of the respiratory system: Secondary | ICD-10-CM | POA: Diagnosis not present

## 2015-02-04 DIAGNOSIS — F419 Anxiety disorder, unspecified: Secondary | ICD-10-CM | POA: Insufficient documentation

## 2015-02-04 DIAGNOSIS — Z79899 Other long term (current) drug therapy: Secondary | ICD-10-CM | POA: Diagnosis not present

## 2015-02-04 DIAGNOSIS — K625 Hemorrhage of anus and rectum: Secondary | ICD-10-CM | POA: Diagnosis present

## 2015-02-04 DIAGNOSIS — Z90710 Acquired absence of both cervix and uterus: Secondary | ICD-10-CM | POA: Diagnosis not present

## 2015-02-04 DIAGNOSIS — Z87442 Personal history of urinary calculi: Secondary | ICD-10-CM | POA: Diagnosis not present

## 2015-02-04 DIAGNOSIS — Z7982 Long term (current) use of aspirin: Secondary | ICD-10-CM | POA: Insufficient documentation

## 2015-02-04 DIAGNOSIS — Z9861 Coronary angioplasty status: Secondary | ICD-10-CM | POA: Insufficient documentation

## 2015-02-04 DIAGNOSIS — I2510 Atherosclerotic heart disease of native coronary artery without angina pectoris: Secondary | ICD-10-CM | POA: Diagnosis not present

## 2015-02-04 DIAGNOSIS — K6289 Other specified diseases of anus and rectum: Secondary | ICD-10-CM | POA: Diagnosis not present

## 2015-02-04 DIAGNOSIS — E669 Obesity, unspecified: Secondary | ICD-10-CM | POA: Insufficient documentation

## 2015-02-04 DIAGNOSIS — F0390 Unspecified dementia without behavioral disturbance: Secondary | ICD-10-CM | POA: Insufficient documentation

## 2015-02-04 DIAGNOSIS — I509 Heart failure, unspecified: Secondary | ICD-10-CM | POA: Insufficient documentation

## 2015-02-04 DIAGNOSIS — Z853 Personal history of malignant neoplasm of breast: Secondary | ICD-10-CM | POA: Insufficient documentation

## 2015-02-04 DIAGNOSIS — E119 Type 2 diabetes mellitus without complications: Secondary | ICD-10-CM | POA: Insufficient documentation

## 2015-02-04 DIAGNOSIS — E785 Hyperlipidemia, unspecified: Secondary | ICD-10-CM | POA: Insufficient documentation

## 2015-02-04 DIAGNOSIS — I252 Old myocardial infarction: Secondary | ICD-10-CM | POA: Diagnosis not present

## 2015-02-04 DIAGNOSIS — I10 Essential (primary) hypertension: Secondary | ICD-10-CM | POA: Diagnosis not present

## 2015-02-04 DIAGNOSIS — Z9981 Dependence on supplemental oxygen: Secondary | ICD-10-CM | POA: Diagnosis not present

## 2015-02-04 DIAGNOSIS — Z9889 Other specified postprocedural states: Secondary | ICD-10-CM | POA: Diagnosis not present

## 2015-02-04 DIAGNOSIS — G4733 Obstructive sleep apnea (adult) (pediatric): Secondary | ICD-10-CM | POA: Insufficient documentation

## 2015-02-04 DIAGNOSIS — Z7951 Long term (current) use of inhaled steroids: Secondary | ICD-10-CM | POA: Diagnosis not present

## 2015-02-04 MED ORDER — LIDOCAINE (ANORECTAL) 5 % EX CREA: TOPICAL_CREAM | CUTANEOUS | Status: DC

## 2015-02-04 MED ORDER — LIDOCAINE HCL 2 % EX GEL: 1.0000 "application " | Freq: Once | CUTANEOUS | Status: DC

## 2015-02-04 NOTE — ED Notes (Signed)
Bed: WA20 Expected date: 02/04/15 Expected time: 12:26 AM Means of arrival: Ambulance Comments: hemorroids

## 2015-02-04 NOTE — ED Provider Notes (Signed)
CSN: [id number]   Arrival date & time 02/04/15 0049  History  This chart was scribed for  Shanon Rosser, MD by Altamease Oiler, ED Scribe. This patient was seen in room WA20/WA20 and the patient's care was started at 2:29 AM.  Chief Complaint  Patient presents with  . Hemorrhoids    HPI The history is provided by the patient. No language interpreter was used.   [name] is a 81 y.o. female who presents to the Emergency Department complaining of increasing hemorrhoid pain and bleeding with onset 6 months ago. Last night after dinner she had an episode of bleeding and increased pain with a bowel movement. Pt states that her hemorrhoids "slide down" with associated "sore" rectal pain whenever she has a bowel movement. Associated symptoms include constipation (treated with Miralax) and nausea. Pt denies vomiting. Pain was "bad" earlier.  Past Medical History  Diagnosis Date  . Coronary artery disease   . Dementia   . Hypertension   . Hyperlipidemia   . Anxiety   . GERD (gastroesophageal reflux disease)   . CHF (congestive heart failure)   . Polyp, stomach 11/11/2006  . Aphakia of left eye   . Obese   . MI (myocardial infarction)   . Chest pain at rest 12/20/2011  . CAD (coronary artery disease), cutting balloon athrectomy of her dominant AV groove of LCX.  2007 12/20/2011  . Bradycardia 12/20/2011  . HTN (hypertension) 12/20/2011  . Dyslipidemia  12/20/2011  . Cardiomyopathy   . History of GI bleed   . Atrial fibrillation   . Sigmoid diverticulosis   . Anemia, iron deficiency 10/24/2012  . Constipation due to pain medication 10/24/2012  . Cataract     "coming back in both eyes" (06/19/2013)  . Chronic bronchitis     "I have it all year round"  . Shortness of breath     "comes on at anytime" (06/19/2013)  . OSA on CPAP 12/20/2011  . Chronic back pain     "all over my back"   . Breast cancer, right breast   . Type II diabetes mellitus   . Headache     "right often" (08/09/2014)   . DJD (degenerative joint disease)   . Osteoarthritis   . Arthritis     "all over my body"  . Nephrolithiasis     bilateral    Past Surgical History  Procedure Laterality Date  . Back surgery    . Appendectomy    . Fracture surgery    . Dilation and curettage of uterus    . Esophagogastroduodenoscopy    . Colonoscopy    . Eus    . Esophagogastroduodenoscopy  02/21/2012    Procedure: ESOPHAGOGASTRODUODENOSCOPY (EGD);  Surgeon: Gatha Mayer, MD;  Location: [facility];  Service: Endoscopy;  Laterality: N/A;  pt. being evaluated for a pacemaker  . Balloon dilation  02/21/2012    Procedure: BALLOON DILATION;  Surgeon: Gatha Mayer, MD;  Location: [facility];  Service: Endoscopy;  Laterality: N/A;  . Colonoscopy N/A 10/24/2012    Procedure: COLONOSCOPY;  Surgeon: Gatha Mayer, MD;  Location: [facility];  Service: Endoscopy;  Laterality: N/A;  . US echocardiography  01/25/2008    mild DUST,mild MR,mod. ca+ mitral annular,AOV mildly sclerotid  . Lexiscan mycardial perfusion scan  01/25/2008    Normal  . Inguinal hernia repair Right 06/18/2013  . Tonsillectomy    . Total abdominal hysterectomy    . Cataract extraction w/ intraocular lens  implant, bilateral  Bilateral   . Breast biopsy Right   . Breast lumpectomy Right   . Inguinal hernia repair Right 06/18/2013    Procedure: HERNIA REPAIR INGUINAL ADULT;  Surgeon: Gwenyth Ober, MD;  Location: [place];  Service: General;  Laterality: Right;  . Insertion of mesh Right 06/18/2013    Procedure: INSERTION OF MESH;  Surgeon: Gwenyth Ober, MD;  Location: [place];  Service: General;  Laterality: Right;  . Left heart catheterization with coronary angiogram N/A 12/21/2011    Procedure: LEFT HEART CATHETERIZATION WITH CORONARY ANGIOGRAM;  Surgeon: Lorretta Harp, MD;  Location: [facility] CATH LAB;  Service: Cardiovascular;  Laterality: N/A;  . Glaucoma surgery Bilateral   . Eye surgery    . Coronary angioplasty with stent placement      "Dr.  Alvester Chou put it in; it's been in a long time"  . Coronary angioplasty  03-07-2004    cutting balloon  . Cardiac catheterization  06/05/2004    high grade disease AV groove CX  . Cardiac catheterization  04/19/2005    noncritical CAD    Family History  Problem Relation Age of Onset  . Colon cancer Mother   . Heart disease Mother   . Stroke Mother     History  Substance Use Topics  . Smoking status: Never Smoker   . Smokeless tobacco: Never Used  . Alcohol Use: No     Review of Systems 10 Systems reviewed and all are negative for acute change except as noted in the HPI.  Home Medications   Prior to Admission medications   Medication Sig Start Date End Date Taking? Authorizing Provider  acetaminophen (TYLENOL) 500 MG tablet Take 500 mg by mouth 3 (three) times daily.    Historical Provider, MD  amLODipine (NORVASC) 10 MG tablet Take 10 mg by mouth daily.    Historical Provider, MD  Artificial Tear Ointment (SOOTHE NIGHT TIME) OINT Place 1 application into the right eye 3 (three) times daily.     Historical Provider, MD  aspirin 81 MG chewable tablet Chew 81 mg by mouth daily.    Historical Provider, MD  bisacodyl (DULCOLAX) 10 MG suppository Place 1 suppository (10 mg total) rectally daily as needed (no bowel movement in 24 hours). 08/12/14   Janece Canterbury, MD  cephALEXin (KEFLEX) 500 MG capsule Take 1 capsule (500 mg total) by mouth 3 (three) times daily. Patient not taking: Reported on 09/27/2014 09/14/14   Larene Pickett, PA-C  cetirizine (ZYRTEC) 5 MG tablet Take 5 mg by mouth every morning.     Historical Provider, MD  cholecalciferol (VITAMIN D) 400 UNITS TABS tablet Take 800 Units by mouth daily.    Historical Provider, MD  citalopram (CELEXA) 20 MG tablet Take 20 mg by mouth daily before breakfast.     Historical Provider, MD  docusate sodium (COLACE) 100 MG capsule Take 100 mg by mouth 2 (two) times daily.    Historical Provider, MD  esomeprazole (NEXIUM) 40 MG capsule Take 40  mg by mouth daily before breakfast.    Historical Provider, MD  fluticasone (FLONASE) 50 MCG/ACT nasal spray Place 2 sprays into both nostrils daily.     Historical Provider, MD  hydrALAZINE (APRESOLINE) 25 MG tablet Take 25 mg by mouth 3 (three) times daily.    Historical Provider, MD  isosorbide mononitrate (IMDUR) 60 MG 24 hr tablet Take 1 tablet (60 mg total) by mouth daily before breakfast. 11/16/12   Isaiah Serge, NP  ketorolac Nancie Neas)  0.5 % ophthalmic solution Place 1 drop into both eyes daily.    Historical Provider, MD  losartan-hydrochlorothiazide (HYZAAR) 100-25 MG per tablet Take 1 tablet by mouth daily before breakfast.     Historical Provider, MD  lubiprostone (AMITIZA) 24 MCG capsule Take 1 capsule (24 mcg total) by mouth 2 (two) times daily with a meal. 10/24/12   Gatha Mayer, MD  nitroGLYCERIN (NITROLINGUAL) 0.4 MG/SPRAY spray Place 2 sprays under the tongue every 5 (five) minutes as needed. Use 2 sprays under tongue as needed for chest pain. Repeat 2 sprays if pain persists after 15 minutes if pain still persists    Historical Provider, MD  Olopatadine HCl (PATADAY) 0.2 % SOLN Place 1 drop into both eyes daily.     Historical Provider, MD  polyethylene glycol (MIRALAX / GLYCOLAX) packet Take 17 g by mouth 3 (three) times daily. Mix in 8 oz of suitable liquid and drink by mouth every day Patient taking differently: Take 17 g by mouth daily. Mix in 8 oz of suitable liquid and drink by mouth every day 08/12/14   Janece Canterbury, MD  pramoxine (PROCTOFOAM) 1 % foam Place 1 application rectally 3 (three) times daily as needed for itching. 12/12/14   Jennifer Piepenbrink, PA-C  rosuvastatin (CRESTOR) 5 MG tablet Take 5 mg by mouth at bedtime.     Historical Provider, MD  senna (SENOKOT) 8.6 MG TABS Take 1 tablet by mouth 2 (two) times daily.    Historical Provider, MD  spironolactone (ALDACTONE) 25 MG tablet Take 25 mg by mouth daily before breakfast.     Historical Provider, MD  starch  (ANUSOL) 51 % suppository Place 1 suppository rectally as needed for pain. Patient not taking: Reported on 02/02/2015 12/12/14   Baron Sane, PA-C  tamsulosin (FLOMAX) 0.4 MG CAPS capsule Take 1 capsule (0.4 mg total) by mouth daily. 08/12/14   Janece Canterbury, MD    Allergies  Chicken allergy; Fish allergy; and Percocet  Triage Vitals: BP 158/59 mmHg  Pulse 64  Temp(Src) 98.3 F (36.8 C) (Oral)  Resp 18  SpO2 98%  Physical Exam  Nursing note and vitals reviewed.  General: Well-developed, well-nourished female in no acute distress; appearance consistent with age of record HENT: normocephalic; atraumatic Eyes: pupils irregular, blind right eye Heart: regular rate and rhythm with frequent PACs Lungs: clear to auscultation bilaterally Abdomen: soft; nondistended; mild diffuse tenderness; bowel sounds present GU: Normal sphincter tone, tenderness on exam; no stool or blood in rectal vault; no hemorrhoids seen or palpated Extremities: No deformity; full range of motion; +1 edema of lower legs Neurologic: Awake, alert and oriented; motor function intact in all extremities and symmetric; no facial droop Skin: Warm and dry Psychiatric: Normal mood and affect  ED Course  Procedures   DIAGNOSTIC STUDIES: Oxygen Saturation is 98% on RA, normal by my interpretation.    COORDINATION OF CARE: 2:37 AM Discussed treatment plan with pt at bedside and pt agreed to plan.    MDM  2:48 AM No hemorrhoids seen or palpated on exam but based on history patient likely has hemorrhoids that manifest themselves with bowel movements. We'll provide topical pain relief. There is no acute bleeding noted.  Final diagnoses:  Anal pain   I personally performed the services described in this documentation, which was scribed in my presence. The recorded information has been reviewed and is accurate.   Shanon Rosser, MD 02/04/15 [phone]

## 2015-02-04 NOTE — ED Notes (Signed)
[facility] presents with a 81 yo female from [place] [facility] facility with hemmorrhoid pain and bleeding.  Pt states that she has had this pain for 6 months but the bleeding has just started and staff confirmeded bleeding per [facility].  She states that she feels like she is going to have a bowel movement but it seems like it is always her hemmorhoids bothering her.  Patient states the only reason why she is here is because of the bleeding.  Hx of afib.  CBG 140.

## 2015-02-10 ENCOUNTER — Encounter (HOSPITAL_COMMUNITY): Payer: Self-pay | Admitting: Emergency Medicine

## 2015-02-10 ENCOUNTER — Emergency Department (HOSPITAL_COMMUNITY)
Admission: EM | Admit: 2015-02-10 | Discharge: 2015-02-11 | Disposition: A | Discharge status: Skilled Nursing Facility | Payer: Medicare Other | Attending: Emergency Medicine | Admitting: Emergency Medicine

## 2015-02-10 DIAGNOSIS — I509 Heart failure, unspecified: Secondary | ICD-10-CM | POA: Insufficient documentation

## 2015-02-10 DIAGNOSIS — Z79899 Other long term (current) drug therapy: Secondary | ICD-10-CM | POA: Insufficient documentation

## 2015-02-10 DIAGNOSIS — Z8709 Personal history of other diseases of the respiratory system: Secondary | ICD-10-CM | POA: Insufficient documentation

## 2015-02-10 DIAGNOSIS — Z9981 Dependence on supplemental oxygen: Secondary | ICD-10-CM | POA: Diagnosis not present

## 2015-02-10 DIAGNOSIS — I2510 Atherosclerotic heart disease of native coronary artery without angina pectoris: Secondary | ICD-10-CM | POA: Insufficient documentation

## 2015-02-10 DIAGNOSIS — Z7982 Long term (current) use of aspirin: Secondary | ICD-10-CM | POA: Diagnosis not present

## 2015-02-10 DIAGNOSIS — Z862 Personal history of diseases of the blood and blood-forming organs and certain disorders involving the immune mechanism: Secondary | ICD-10-CM | POA: Diagnosis not present

## 2015-02-10 DIAGNOSIS — F0390 Unspecified dementia without behavioral disturbance: Secondary | ICD-10-CM | POA: Diagnosis not present

## 2015-02-10 DIAGNOSIS — Z9861 Coronary angioplasty status: Secondary | ICD-10-CM | POA: Insufficient documentation

## 2015-02-10 DIAGNOSIS — G8929 Other chronic pain: Secondary | ICD-10-CM | POA: Diagnosis not present

## 2015-02-10 DIAGNOSIS — F419 Anxiety disorder, unspecified: Secondary | ICD-10-CM | POA: Insufficient documentation

## 2015-02-10 DIAGNOSIS — Z9889 Other specified postprocedural states: Secondary | ICD-10-CM | POA: Insufficient documentation

## 2015-02-10 DIAGNOSIS — Z853 Personal history of malignant neoplasm of breast: Secondary | ICD-10-CM | POA: Diagnosis not present

## 2015-02-10 DIAGNOSIS — Z87442 Personal history of urinary calculi: Secondary | ICD-10-CM | POA: Insufficient documentation

## 2015-02-10 DIAGNOSIS — E669 Obesity, unspecified: Secondary | ICD-10-CM | POA: Insufficient documentation

## 2015-02-10 DIAGNOSIS — I10 Essential (primary) hypertension: Secondary | ICD-10-CM | POA: Insufficient documentation

## 2015-02-10 DIAGNOSIS — K5731 Diverticulosis of large intestine without perforation or abscess with bleeding: Secondary | ICD-10-CM | POA: Insufficient documentation

## 2015-02-10 DIAGNOSIS — R1030 Lower abdominal pain, unspecified: Secondary | ICD-10-CM

## 2015-02-10 DIAGNOSIS — K219 Gastro-esophageal reflux disease without esophagitis: Secondary | ICD-10-CM | POA: Diagnosis not present

## 2015-02-10 DIAGNOSIS — G4733 Obstructive sleep apnea (adult) (pediatric): Secondary | ICD-10-CM | POA: Diagnosis not present

## 2015-02-10 DIAGNOSIS — Z7951 Long term (current) use of inhaled steroids: Secondary | ICD-10-CM | POA: Insufficient documentation

## 2015-02-10 DIAGNOSIS — E785 Hyperlipidemia, unspecified: Secondary | ICD-10-CM | POA: Insufficient documentation

## 2015-02-10 DIAGNOSIS — I252 Old myocardial infarction: Secondary | ICD-10-CM | POA: Insufficient documentation

## 2015-02-10 DIAGNOSIS — E119 Type 2 diabetes mellitus without complications: Secondary | ICD-10-CM | POA: Diagnosis not present

## 2015-02-10 DIAGNOSIS — M158 Other polyosteoarthritis: Secondary | ICD-10-CM | POA: Diagnosis not present

## 2015-02-10 DIAGNOSIS — K625 Hemorrhage of anus and rectum: Secondary | ICD-10-CM

## 2015-02-10 NOTE — ED Provider Notes (Signed)
CSN: [id number]   Arrival date & time 02/10/15 2217  History  This chart was scribed for Varney Biles, MD by Altamease Oiler, ED Scribe. This patient was seen in room WA04/WA04 and the patient's care was started at 11:10 PM.  Chief Complaint  Patient presents with  . Rectal Bleeding    HPI The history is provided by the patient. No language interpreter was used.   [name] is a 81 y.o. female who presents to the Emergency Department complaining of worsening hemorrhoid pain and bleeding with onset several months ago. Associated symptoms include rectal pain that is worse with bowel movements and walking. Pt states that every time she eats she has to move her bowels and has severe pain. The cream that she was prescribed in the ED on 02/04/15 and OTC suppositories have provided insufficient pain relief PTA. Pt states that her PCP has done everything that she can to help her with the pain.   Past Medical History  Diagnosis Date  . Coronary artery disease   . Dementia   . Hypertension   . Hyperlipidemia   . Anxiety   . GERD (gastroesophageal reflux disease)   . CHF (congestive heart failure)   . Polyp, stomach 11/11/2006  . Aphakia of left eye   . Obese   . MI (myocardial infarction)   . Chest pain at rest 12/20/2011  . CAD (coronary artery disease), cutting balloon athrectomy of her dominant AV groove of LCX.  2007 12/20/2011  . Bradycardia 12/20/2011  . HTN (hypertension) 12/20/2011  . Dyslipidemia  12/20/2011  . Cardiomyopathy   . History of GI bleed   . Atrial fibrillation   . Sigmoid diverticulosis   . Anemia, iron deficiency 10/24/2012  . Constipation due to pain medication 10/24/2012  . Cataract     "coming back in both eyes" (06/19/2013)  . Chronic bronchitis     "I have it all year round"  . Shortness of breath     "comes on at anytime" (06/19/2013)  . OSA on CPAP 12/20/2011  . Chronic back pain     "all over my back"   . Breast cancer, right breast   . Type II diabetes  mellitus   . Headache     "right often" (08/09/2014)  . DJD (degenerative joint disease)   . Osteoarthritis   . Arthritis     "all over my body"  . Nephrolithiasis     bilateral    Past Surgical History  Procedure Laterality Date  . Back surgery    . Appendectomy    . Fracture surgery    . Dilation and curettage of uterus    . Esophagogastroduodenoscopy    . Colonoscopy    . Eus    . Esophagogastroduodenoscopy  02/21/2012    Procedure: ESOPHAGOGASTRODUODENOSCOPY (EGD);  Surgeon: Gatha Mayer, MD;  Location: [facility];  Service: Endoscopy;  Laterality: N/A;  pt. being evaluated for a pacemaker  . Balloon dilation  02/21/2012    Procedure: BALLOON DILATION;  Surgeon: Gatha Mayer, MD;  Location: [facility];  Service: Endoscopy;  Laterality: N/A;  . Colonoscopy N/A 10/24/2012    Procedure: COLONOSCOPY;  Surgeon: Gatha Mayer, MD;  Location: [facility];  Service: Endoscopy;  Laterality: N/A;  . US echocardiography  01/25/2008    mild DUST,mild MR,mod. ca+ mitral annular,AOV mildly sclerotid  . Lexiscan mycardial perfusion scan  01/25/2008    Normal  . Inguinal hernia repair Right 06/18/2013  . Tonsillectomy    .  Total abdominal hysterectomy    . Cataract extraction w/ intraocular lens  implant, bilateral Bilateral   . Breast biopsy Right   . Breast lumpectomy Right   . Inguinal hernia repair Right 06/18/2013    Procedure: HERNIA REPAIR INGUINAL ADULT;  Surgeon: Gwenyth Ober, MD;  Location: [facility];  Service: General;  Laterality: Right;  . Insertion of mesh Right 06/18/2013    Procedure: INSERTION OF MESH;  Surgeon: Gwenyth Ober, MD;  Location: [facility];  Service: General;  Laterality: Right;  . Left heart catheterization with coronary angiogram N/A 12/21/2011    Procedure: LEFT HEART CATHETERIZATION WITH CORONARY ANGIOGRAM;  Surgeon: Lorretta Harp, MD;  Location: [facility];  Service: Cardiovascular;  Laterality: N/A;  . Glaucoma surgery Bilateral   . Eye surgery    .  Coronary angioplasty with stent placement      "Dr. Alvester Chou put it in; it's been in a long time"  . Coronary angioplasty  03-07-2004    cutting balloon  . Cardiac catheterization  06/05/2004    high grade disease AV groove CX  . Cardiac catheterization  04/19/2005    noncritical CAD    Family History  Problem Relation Age of Onset  . Colon cancer Mother   . Heart disease Mother   . Stroke Mother     Social History  Substance Use Topics  . Smoking status: Never Smoker   . Smokeless tobacco: Never Used  . Alcohol Use: No     Review of Systems  Gastrointestinal: Positive for rectal pain.       Bleeding hemorrhoids     Home Medications   Prior to Admission medications   Medication Sig Start Date End Date Taking? Authorizing Provider  acetaminophen (TYLENOL) 500 MG tablet Take 500 mg by mouth 3 (three) times daily.   Yes Historical Provider, MD  amLODipine (NORVASC) 10 MG tablet Take 10 mg by mouth daily.   Yes Historical Provider, MD  Artificial Tear Ointment (SOOTHE NIGHT TIME) OINT Place 1 application into the right eye 3 (three) times daily.    Yes Historical Provider, MD  aspirin 81 MG chewable tablet Chew 81 mg by mouth daily.   Yes Historical Provider, MD  bisacodyl (DULCOLAX) 10 MG suppository Place 1 suppository (10 mg total) rectally daily as needed (no bowel movement in 24 hours). 08/12/14  Yes Janece Canterbury, MD  cetirizine (ZYRTEC) 5 MG tablet Take 5 mg by mouth every morning.    Yes Historical Provider, MD  cholecalciferol (VITAMIN D) 400 UNITS TABS tablet Take 800 Units by mouth daily.   Yes Historical Provider, MD  citalopram (CELEXA) 20 MG tablet Take 20 mg by mouth daily before breakfast.    Yes Historical Provider, MD  docusate sodium (COLACE) 100 MG capsule Take 100 mg by mouth 2 (two) times daily.   Yes Historical Provider, MD  esomeprazole (NEXIUM) 40 MG capsule Take 40 mg by mouth daily before breakfast.   Yes Historical Provider, MD  fluticasone (FLONASE) 50  MCG/ACT nasal spray Place 2 sprays into both nostrils daily.    Yes Historical Provider, MD  hydrALAZINE (APRESOLINE) 25 MG tablet Take 25 mg by mouth 3 (three) times daily.   Yes Historical Provider, MD  isosorbide mononitrate (IMDUR) 60 MG 24 hr tablet Take 1 tablet (60 mg total) by mouth daily before breakfast. 11/16/12  Yes Isaiah Serge, NP  ketorolac (ACULAR) 0.5 % ophthalmic solution Place 1 drop into both eyes daily.   Yes  Historical Provider, MD  Lidocaine, Anorectal, 5 % CREA Apply to perianal area as needed for pain. 02/04/15  Yes John Molpus, MD  losartan-hydrochlorothiazide (HYZAAR) 100-25 MG per tablet Take 1 tablet by mouth daily before breakfast.    Yes Historical Provider, MD  lubiprostone (AMITIZA) 24 MCG capsule Take 1 capsule (24 mcg total) by mouth 2 (two) times daily with a meal. 10/24/12  Yes Gatha Mayer, MD  nitroGLYCERIN (NITROLINGUAL) 0.4 MG/SPRAY spray Place 2 sprays under the tongue every 5 (five) minutes as needed. Use 2 sprays under tongue as needed for chest pain. Repeat 2 sprays if pain persists after 15 minutes if pain still persists   Yes Historical Provider, MD  polyethylene glycol (MIRALAX / GLYCOLAX) packet Take 17 g by mouth 3 (three) times daily. Mix in 8 oz of suitable liquid and drink by mouth every day Patient taking differently: Take 17 g by mouth daily. Mix in 8 oz of suitable liquid and drink by mouth every day 08/12/14  Yes Janece Canterbury, MD  pramoxine (PROCTOFOAM) 1 % foam Place 1 application rectally 3 (three) times daily as needed for itching. 12/12/14  Yes Jennifer Piepenbrink, PA-C  rosuvastatin (CRESTOR) 5 MG tablet Take 5 mg by mouth at bedtime.    Yes Historical Provider, MD  senna (SENOKOT) 8.6 MG TABS Take 1 tablet by mouth 2 (two) times daily.   Yes Historical Provider, MD  spironolactone (ALDACTONE) 25 MG tablet Take 25 mg by mouth daily before breakfast.    Yes Historical Provider, MD  tamsulosin (FLOMAX) 0.4 MG CAPS capsule Take 1 capsule (0.4  mg total) by mouth daily. 08/12/14  Yes Janece Canterbury, MD    Allergies  Chicken allergy; Fish allergy; and Percocet  Triage Vitals: BP 138/51 mmHg  Pulse 73  Temp(Src) 98.6 F (37 C) (Oral)  Resp 16  SpO2 98%  Physical Exam  Constitutional: She is oriented to person, place, and time. She appears well-developed and well-nourished.  HENT:  Head: Normocephalic.  Eyes: EOM are normal.  Neck: Normal range of motion.  Cardiovascular: Normal rate and regular rhythm.   Pulmonary/Chest: Effort normal.  Abdominal: She exhibits no distension.  Suprapubic and bilateral lower quadrant tenderness  Genitourinary:  External:no anal fissures, small hemorrhoids around rectum with no active bleeding Rectal:tender but no evidence of abscess, no fistula appreciated  Musculoskeletal: Normal range of motion.  Neurological: She is alert and oriented to person, place, and time.  Psychiatric: She has a normal mood and affect.  Nursing note and vitals reviewed.   ED Course  Procedures   DIAGNOSTIC STUDIES: Oxygen Saturation is 98% on RA, normal by my interpretation.    COORDINATION OF CARE: 11:18 PM Discussed treatment plan which includes lab work with pt at bedside and pt agreed to plan.  I, Varney Biles, MD, personally reviewed and evaluated these images and lab results as part of my medical decision-making.   Labs Review-  Labs Reviewed  CBC WITH DIFFERENTIAL/PLATELET - Abnormal; Notable for the following:    WBC 3.2 (*)    RBC 3.48 (*)    Hemoglobin 10.0 (*)    HCT 30.1 (*)    Neutro Abs 1.4 (*)    Monocytes Relative 16 (*)    All other components within normal limits  COMPREHENSIVE METABOLIC PANEL - Abnormal; Notable for the following:    Sodium 130 (*)    Potassium 3.1 (*)    Chloride 99 (*)    Glucose, Bld 100 (*)  Total Protein 6.3 (*)    Albumin 3.3 (*)    Alkaline Phosphatase 33 (*)    All other components within normal limits  URINALYSIS, ROUTINE W REFLEX  MICROSCOPIC (NOT AT [facility]) - Abnormal; Notable for the following:    Specific Gravity, Urine 1.004 (*)    All other components within normal limits  URINE CULTURE  PROTIME-INR  APTT  I-STAT CG4 LACTIC ACID, ED    Imaging Review Ct Abdomen Pelvis W Contrast  02/11/2015   CLINICAL DATA:  Abdominal pain in the lower quadrants.  EXAM: CT ABDOMEN AND PELVIS WITH CONTRAST  TECHNIQUE: Multidetector CT imaging of the abdomen and pelvis was performed using the standard protocol following bolus administration of intravenous contrast.  CONTRAST:  134mL OMNIPAQUE IOHEXOL 300 MG/ML SOLN, 39mL OMNIPAQUE IOHEXOL 300 MG/ML SOLN  COMPARISON:  08/09/2014  FINDINGS: Motion degraded imaging at the level of the pelvis.  BODY WALL: No contributory findings.  LOWER CHEST: Mild dependent atelectasis.  ABDOMEN/PELVIS:  Liver: No focal abnormality.  Biliary: No evidence of biliary obstruction or stone.  Pancreas: Unremarkable.  Spleen: Unremarkable.  Adrenals: Unremarkable.  Kidneys and ureters: Mild renal cortical thinning with focal right upper pole scarring. Small bilateral renal calculi. No hydronephrosis or ureteral calculus.  Bladder: Moderately distended without wall thickening.  Reproductive: Hysterectomy.  No adnexal mass.  Bowel: Scattered colonic diverticulosis without inflammatory change. No pericecal inflammation in this patient with history of appendectomy. No bowel obstruction.  Retroperitoneum: No mass or adenopathy.  Peritoneum: No ascites or pneumoperitoneum.  Vascular: No acute abnormality.  OSSEOUS: Diffuse degenerative disc and facet disease with grade 1 L4-5 anterolisthesis and L1-2 and L2-3 retrolisthesis. Multilevel biforaminal stenosis. Spinal canal stenosis is greatest at L4-5 from slip and facet arthropathy, at least moderate, status post right laminotomy.  IMPRESSION: 1. No explanation for acute abdominal pain. 2. Chronic findings are stable from February 2016 and noted above.   Electronically Signed    By: Monte Fantasia M.D.   On: 02/11/2015 05:37    EKG Interpretation None      MDM   Final diagnoses:  Lower abdominal pain  Rectal bleeding  Diverticulosis of large intestine with hemorrhage     I personally performed the services described in this documentation, which was scribed in my presence. The recorded information has been reviewed and is accurate.  Pt comes in with cc of bloody stools. She has hx of diverticular dz, and reports that her current symptoms have been present for several weeks now. Rectal exam is benign. CT done, as this is her 2nd visit, she has some lower quadrant pain, and we had concerns for diverticulitis or other intraabdominal process. Ct is neg. Will d.c. UA is clean     Varney Biles, MD 02/11/15 [phone]

## 2015-02-10 NOTE — ED Notes (Addendum)
Per EMS-([facility]) c/o rectal bleeding x 6 months. Increased bleeding with 2 BMs today. Denies any other s/s. VS: BP 135/56 HR 72 SpO2 97%.   Hx hemorrhoids. Denies constipation but has had occurences of diarrhea.

## 2015-02-10 NOTE — ED Notes (Signed)
Bed: WA04 Expected date:  Expected time:  Means of arrival:  Comments: EMS/45F/rectal bleeding

## 2015-02-11 ENCOUNTER — Emergency Department (HOSPITAL_COMMUNITY): Payer: Medicare Other

## 2015-02-11 DIAGNOSIS — K5731 Diverticulosis of large intestine without perforation or abscess with bleeding: Secondary | ICD-10-CM | POA: Diagnosis not present

## 2015-02-11 LAB — COMPREHENSIVE METABOLIC PANEL
ALBUMIN: 3.3 g/dL — AB (ref 3.5–5.0)
ALK PHOS: 33 U/L — AB (ref 38–126)
ALT: 19 U/L (ref 14–54)
ANION GAP: 5 (ref 5–15)
AST: 27 U/L (ref 15–41)
BUN: 11 mg/dL (ref 6–20)
CALCIUM: 8.9 mg/dL (ref 8.9–10.3)
CHLORIDE: 99 mmol/L — AB (ref 101–111)
CO2: 26 mmol/L (ref 22–32)
Creatinine, Ser: 0.78 mg/dL (ref 0.44–1.00)
GFR calc Af Amer: >60 mL/min (ref >60)
GFR calc non Af Amer: >60 mL/min (ref >60)
GLUCOSE: 100 mg/dL — AB (ref 65–99)
Potassium: 3.1 mmol/L — ABNORMAL LOW (ref 3.5–5.1)
SODIUM: 130 mmol/L — AB (ref 135–145)
Total Bilirubin: 0.6 mg/dL (ref 0.3–1.2)
Total Protein: 6.3 g/dL — ABNORMAL LOW (ref 6.5–8.1)

## 2015-02-11 LAB — CBC WITH DIFFERENTIAL/PLATELET
BASOS PCT: 1 % (ref 0–1)
Basophils Absolute: 0 10*3/uL (ref 0.0–0.1)
Eosinophils Absolute: 0.1 10*3/uL (ref 0.0–0.7)
Eosinophils Relative: 3 % (ref 0–5)
HCT: 30.1 % — ABNORMAL LOW (ref 36.0–46.0)
Hemoglobin: 10 g/dL — ABNORMAL LOW (ref 12.0–15.0)
Lymphocytes Relative: 37 % (ref 12–46)
Lymphs Abs: 1.2 10*3/uL (ref 0.7–4.0)
MCH: 28.7 pg (ref 26.0–34.0)
MCHC: 33.2 g/dL (ref 30.0–36.0)
MCV: 86.5 fL (ref 78.0–100.0)
MONOS PCT: 16 % — AB (ref 3–12)
Monocytes Absolute: 0.5 10*3/uL (ref 0.1–1.0)
NEUTROS PCT: 43 % (ref 43–77)
Neutro Abs: 1.4 10*3/uL — ABNORMAL LOW (ref 1.7–7.7)
Platelets: 246 10*3/uL (ref 150–400)
RBC: 3.48 MIL/uL — AB (ref 3.87–5.11)
RDW: 14.4 % (ref 11.5–15.5)
WBC: 3.2 10*3/uL — AB (ref 4.0–10.5)

## 2015-02-11 LAB — URINALYSIS, ROUTINE W REFLEX MICROSCOPIC
BILIRUBIN URINE: NEGATIVE
Glucose, UA: NEGATIVE mg/dL
Hgb urine dipstick: NEGATIVE
KETONES UR: NEGATIVE mg/dL
LEUKOCYTES UA: NEGATIVE
NITRITE: NEGATIVE
PROTEIN: NEGATIVE mg/dL
Specific Gravity, Urine: 1.004 — ABNORMAL LOW (ref 1.005–1.030)
UROBILINOGEN UA: 0.2 mg/dL (ref 0.0–1.0)
pH: 7 (ref 5.0–8.0)

## 2015-02-11 LAB — PROTIME-INR
INR: 0.85 (ref 0.00–1.49)
Prothrombin Time: 11.9 seconds (ref 11.6–15.2)

## 2015-02-11 LAB — I-STAT CG4 LACTIC ACID, ED: LACTIC ACID, VENOUS: 0.68 mmol/L (ref 0.5–2.0)

## 2015-02-11 LAB — APTT: APTT: 30 s (ref 24–37)

## 2015-02-11 MED ORDER — IOHEXOL 300 MG/ML  SOLN
100.0000 mL | Freq: Once | INTRAMUSCULAR | Status: AC | PRN
  Administered 2015-02-11: 100 mL via INTRAVENOUS

## 2015-02-11 MED ORDER — IOHEXOL 300 MG/ML  SOLN
50.0000 mL | Freq: Once | INTRAMUSCULAR | Status: AC | PRN
  Administered 2015-02-11: 50 mL via ORAL

## 2015-02-11 NOTE — ED Notes (Signed)
Report called to [facility]  

## 2015-02-11 NOTE — Discharge Instructions (Signed)
We saw you in the ER for the abdominal pain and bloody stools. All the results in the ER are normal, labs and imaging. We are not sure what is causing your symptoms. We think the bleed can be diverticular bleed. The workup in the ER is not complete, and is limited to screening for life threatening and emergent conditions only, so please see a primary care doctor for further evaluation.   Abdominal Pain Many things can cause abdominal pain. Usually, abdominal pain is not caused by a disease and will improve without treatment. It can often be observed and treated at home. Your health care provider will do a physical exam and possibly order blood tests and X-rays to help determine the seriousness of your pain. However, in many cases, more time must pass before a clear cause of the pain can be found. Before that point, your health care provider may not know if you need more testing or further treatment. HOME CARE INSTRUCTIONS  Monitor your abdominal pain for any changes. The following actions may help to alleviate any discomfort you are experiencing:  Only take over-the-counter or prescription medicines as directed by your health care provider.  Do not take laxatives unless directed to do so by your health care provider.  Try a clear liquid diet (broth, tea, or water) as directed by your health care provider. Slowly move to a bland diet as tolerated. SEEK MEDICAL CARE IF:  You have unexplained abdominal pain.  You have abdominal pain associated with nausea or diarrhea.  You have pain when you urinate or have a bowel movement.  You experience abdominal pain that wakes you in the night.  You have abdominal pain that is worsened or improved by eating food.  You have abdominal pain that is worsened with eating fatty foods.  You have a fever. SEEK IMMEDIATE MEDICAL CARE IF:   Your pain does not go away within 2 hours.  You keep throwing up (vomiting).  Your pain is felt only in portions  of the abdomen, such as the right side or the left lower portion of the abdomen.  You pass bloody or black tarry stools. MAKE SURE YOU:  Understand these instructions.   Will watch your condition.   Will get help right away if you are not doing well or get worse.  Document Released: 03/24/2005 Document Revised: 06/19/2013 Document Reviewed: 02/21/2013 [facility] Patient Information 2015 [place], [place]. This information is not intended to replace advice given to you by your health care provider. Make sure you discuss any questions you have with your health care provider. Diverticulosis Diverticulosis is the condition that develops when small pouches (diverticula) form in the wall of your colon. Your colon, or large intestine, is where water is absorbed and stool is formed. The pouches form when the inside layer of your colon pushes through weak spots in the outer layers of your colon. CAUSES  No one knows exactly what causes diverticulosis. RISK FACTORS  Being older than 38. Your risk for this condition increases with age. Diverticulosis is rare in people younger than 40 years. By age 62, almost everyone has it.  Eating a low-fiber diet.  Being frequently constipated.  Being overweight.  Not getting enough exercise.  Smoking.  Taking over-the-counter pain medicines, like aspirin and ibuprofen. SYMPTOMS  Most people with diverticulosis do not have symptoms. DIAGNOSIS  Because diverticulosis often has no symptoms, health care providers often discover the condition during an exam for other colon problems. In many cases, a  health care provider will diagnose diverticulosis while using a flexible scope to examine the colon (colonoscopy). TREATMENT  If you have never developed an infection related to diverticulosis, you may not need treatment. If you have had an infection before, treatment may include:  Eating more fruits, vegetables, and grains.  Taking a fiber  supplement.  Taking a live bacteria supplement (probiotic).  Taking medicine to relax your colon. HOME CARE INSTRUCTIONS   Drink at least 6-8 glasses of water each day to prevent constipation.  Try not to strain when you have a bowel movement.  Keep all follow-up appointments. If you have had an infection before:  Increase the fiber in your diet as directed by your health care provider or dietitian.  Take a dietary fiber supplement if your health care provider approves.  Only take medicines as directed by your health care provider. SEEK MEDICAL CARE IF:   You have abdominal pain.  You have bloating.  You have cramps.  You have not gone to the bathroom in 3 days. SEEK IMMEDIATE MEDICAL CARE IF:   Your pain gets worse.  Yourbloating becomes very bad.  You have a fever or chills, and your symptoms suddenly get worse.  You begin vomiting.  You have bowel movements that are bloody or black. MAKE SURE YOU:  Understand these instructions.  Will watch your condition.  Will get help right away if you are not doing well or get worse. Document Released: 03/11/2004 Document Revised: 06/19/2013 Document Reviewed: 05/09/2013 [facility] Patient Information 2015 [place], [place]. This information is not intended to replace advice given to you by your health care provider. Make sure you discuss any questions you have with your health care provider.

## 2015-02-11 NOTE — ED Notes (Signed)
[facility] called and will be here to transport as soon as possible

## 2015-02-12 LAB — URINE CULTURE: Culture: NO GROWTH

## 2015-03-11 ENCOUNTER — Ambulatory Visit: Payer: Medicare Other | Admitting: Cardiovascular Disease

## 2015-03-27 ENCOUNTER — Emergency Department (HOSPITAL_COMMUNITY)
Admission: EM | Admit: 2015-03-27 | Discharge: 2015-03-27 | Disposition: A | Discharge status: Home / Self Care | Payer: Medicare Other | Attending: Emergency Medicine | Admitting: Emergency Medicine

## 2015-03-27 ENCOUNTER — Emergency Department (HOSPITAL_COMMUNITY): Payer: Medicare Other

## 2015-03-27 ENCOUNTER — Encounter (HOSPITAL_COMMUNITY): Payer: Self-pay | Admitting: Emergency Medicine

## 2015-03-27 DIAGNOSIS — Z7982 Long term (current) use of aspirin: Secondary | ICD-10-CM | POA: Insufficient documentation

## 2015-03-27 DIAGNOSIS — K219 Gastro-esophageal reflux disease without esophagitis: Secondary | ICD-10-CM | POA: Insufficient documentation

## 2015-03-27 DIAGNOSIS — I10 Essential (primary) hypertension: Secondary | ICD-10-CM | POA: Diagnosis not present

## 2015-03-27 DIAGNOSIS — Z87442 Personal history of urinary calculi: Secondary | ICD-10-CM | POA: Insufficient documentation

## 2015-03-27 DIAGNOSIS — I509 Heart failure, unspecified: Secondary | ICD-10-CM | POA: Diagnosis not present

## 2015-03-27 DIAGNOSIS — F0390 Unspecified dementia without behavioral disturbance: Secondary | ICD-10-CM | POA: Insufficient documentation

## 2015-03-27 DIAGNOSIS — G8929 Other chronic pain: Secondary | ICD-10-CM | POA: Insufficient documentation

## 2015-03-27 DIAGNOSIS — I252 Old myocardial infarction: Secondary | ICD-10-CM | POA: Diagnosis not present

## 2015-03-27 DIAGNOSIS — E669 Obesity, unspecified: Secondary | ICD-10-CM | POA: Insufficient documentation

## 2015-03-27 DIAGNOSIS — E785 Hyperlipidemia, unspecified: Secondary | ICD-10-CM | POA: Diagnosis not present

## 2015-03-27 DIAGNOSIS — K623 Rectal prolapse: Secondary | ICD-10-CM | POA: Diagnosis not present

## 2015-03-27 DIAGNOSIS — Z862 Personal history of diseases of the blood and blood-forming organs and certain disorders involving the immune mechanism: Secondary | ICD-10-CM | POA: Insufficient documentation

## 2015-03-27 DIAGNOSIS — Z853 Personal history of malignant neoplasm of breast: Secondary | ICD-10-CM | POA: Diagnosis not present

## 2015-03-27 DIAGNOSIS — Z79899 Other long term (current) drug therapy: Secondary | ICD-10-CM | POA: Insufficient documentation

## 2015-03-27 DIAGNOSIS — K6289 Other specified diseases of anus and rectum: Secondary | ICD-10-CM | POA: Diagnosis present

## 2015-03-27 DIAGNOSIS — F419 Anxiety disorder, unspecified: Secondary | ICD-10-CM | POA: Diagnosis not present

## 2015-03-27 DIAGNOSIS — Z7951 Long term (current) use of inhaled steroids: Secondary | ICD-10-CM | POA: Diagnosis not present

## 2015-03-27 DIAGNOSIS — G4733 Obstructive sleep apnea (adult) (pediatric): Secondary | ICD-10-CM | POA: Insufficient documentation

## 2015-03-27 DIAGNOSIS — I2510 Atherosclerotic heart disease of native coronary artery without angina pectoris: Secondary | ICD-10-CM | POA: Insufficient documentation

## 2015-03-27 DIAGNOSIS — Z9981 Dependence on supplemental oxygen: Secondary | ICD-10-CM | POA: Insufficient documentation

## 2015-03-27 LAB — CBC WITH DIFFERENTIAL/PLATELET
Basophils Absolute: 0 10*3/uL (ref 0.0–0.1)
Basophils Relative: 1 %
EOS ABS: 0.1 10*3/uL (ref 0.0–0.7)
Eosinophils Relative: 1 %
HCT: 32.4 % — ABNORMAL LOW (ref 36.0–46.0)
Hemoglobin: 10.6 g/dL — ABNORMAL LOW (ref 12.0–15.0)
Lymphocytes Relative: 24 %
Lymphs Abs: 1 10*3/uL (ref 0.7–4.0)
MCH: 28.6 pg (ref 26.0–34.0)
MCHC: 32.7 g/dL (ref 30.0–36.0)
MCV: 87.6 fL (ref 78.0–100.0)
MONO ABS: 0.5 10*3/uL (ref 0.1–1.0)
Monocytes Relative: 12 %
Neutro Abs: 2.4 10*3/uL (ref 1.7–7.7)
Neutrophils Relative %: 62 %
PLATELETS: 247 10*3/uL (ref 150–400)
RBC: 3.7 MIL/uL — ABNORMAL LOW (ref 3.87–5.11)
RDW: 14.3 % (ref 11.5–15.5)
WBC: 3.9 10*3/uL — ABNORMAL LOW (ref 4.0–10.5)

## 2015-03-27 LAB — BASIC METABOLIC PANEL
Anion gap: 7 (ref 5–15)
BUN: 13 mg/dL (ref 6–20)
CO2: 26 mmol/L (ref 22–32)
CREATININE: 0.97 mg/dL (ref 0.44–1.00)
Calcium: 9.6 mg/dL (ref 8.9–10.3)
Chloride: 107 mmol/L (ref 101–111)
GFR calc Af Amer: 60 mL/min — ABNORMAL LOW (ref >60)
GFR, EST NON AFRICAN AMERICAN: 51 mL/min — AB (ref >60)
GLUCOSE: 90 mg/dL (ref 65–99)
Potassium: 3.6 mmol/L (ref 3.5–5.1)
SODIUM: 140 mmol/L (ref 135–145)

## 2015-03-27 MED ORDER — MORPHINE SULFATE (PF) 2 MG/ML IV SOLN
2.0000 mg | Freq: Once | INTRAVENOUS | Status: AC
  Administered 2015-03-27: 2 mg via INTRAVENOUS
  Filled 2015-03-27: qty 1

## 2015-03-27 MED ORDER — HYDROMORPHONE HCL 1 MG/ML IJ SOLN
0.5000 mg | Freq: Once | INTRAMUSCULAR | Status: AC
  Administered 2015-03-27: 0.5 mg via INTRAVENOUS
  Filled 2015-03-27: qty 1

## 2015-03-27 MED ORDER — ACETAMINOPHEN 325 MG PO TABS: 650.0000 mg | ORAL_TABLET | Freq: Once | ORAL | Status: DC

## 2015-03-27 MED ORDER — ONDANSETRON HCL 4 MG/2ML IJ SOLN
4.0000 mg | Freq: Once | INTRAMUSCULAR | Status: AC
  Administered 2015-03-27: 4 mg via INTRAVENOUS
  Filled 2015-03-27: qty 2

## 2015-03-27 MED ORDER — LIDOCAINE HCL 2 % EX GEL
1.0000 "application " | Freq: Once | CUTANEOUS | Status: AC
  Administered 2015-03-27: 1 via TOPICAL
  Filled 2015-03-27: qty 20

## 2015-03-27 NOTE — ED Notes (Signed)
Pt verbalized understanding of discharge orders, including GI follow-up.  Report also called to facility and given to [facility].

## 2015-03-27 NOTE — ED Notes (Signed)
Pt left via [facility] 

## 2015-03-27 NOTE — ED Notes (Signed)
Pt states "tylenol don't do no good-- I am tired of hurting"

## 2015-03-27 NOTE — ED Notes (Signed)
[facility] called for pt pick up

## 2015-03-27 NOTE — ED Provider Notes (Signed)
CSN: [id number]     Arrival date & time 03/27/15  7939 History   First MD Initiated Contact with Patient 03/27/15 [phone]     Chief Complaint  Patient presents with  . prolapsed rectum   . Rectal Pain     (Consider location/radiation/quality/duration/timing/severity/associated sxs/prior Treatment) HPI Comments: Patient presents to the ER for evaluation of rectal pain. Patient reports that every time she eats and then has a bowel movement, "something comes out". Patient has been seen in the ER before I treated for hemorrhoids. Patient states that she is not experiencing hemorrhoids, is experiencing severe pain in the rectal area currently. There is no fever, nausea, vomiting.   Past Medical History  Diagnosis Date  . Coronary artery disease   . Dementia   . Hypertension   . Hyperlipidemia   . Anxiety   . GERD (gastroesophageal reflux disease)   . CHF (congestive heart failure)   . Polyp, stomach 11/11/2006  . Aphakia of left eye   . Obese   . MI (myocardial infarction)   . Chest pain at rest 12/20/2011  . CAD (coronary artery disease), cutting balloon athrectomy of her dominant AV groove of LCX.  2007 12/20/2011  . Bradycardia 12/20/2011  . HTN (hypertension) 12/20/2011  . Dyslipidemia  12/20/2011  . Cardiomyopathy   . History of GI bleed   . Atrial fibrillation   . Sigmoid diverticulosis   . Anemia, iron deficiency 10/24/2012  . Constipation due to pain medication 10/24/2012  . Cataract     "coming back in both eyes" (06/19/2013)  . Chronic bronchitis     "I have it all year round"  . Shortness of breath     "comes on at anytime" (06/19/2013)  . OSA on CPAP 12/20/2011  . Chronic back pain     "all over my back"   . Breast cancer, right breast   . Type II diabetes mellitus   . Headache     "right often" (08/09/2014)  . DJD (degenerative joint disease)   . Osteoarthritis   . Arthritis     "all over my body"  . Nephrolithiasis     bilateral   Past Surgical History   Procedure Laterality Date  . Back surgery    . Appendectomy    . Fracture surgery    . Dilation and curettage of uterus    . Esophagogastroduodenoscopy    . Colonoscopy    . Eus    . Esophagogastroduodenoscopy  02/21/2012    Procedure: ESOPHAGOGASTRODUODENOSCOPY (EGD);  Surgeon: Gatha Mayer, MD;  Location: [facility];  Service: Endoscopy;  Laterality: N/A;  pt. being evaluated for a pacemaker  . Balloon dilation  02/21/2012    Procedure: BALLOON DILATION;  Surgeon: Gatha Mayer, MD;  Location: [facility];  Service: Endoscopy;  Laterality: N/A;  . Colonoscopy N/A 10/24/2012    Procedure: COLONOSCOPY;  Surgeon: Gatha Mayer, MD;  Location: [facility];  Service: Endoscopy;  Laterality: N/A;  . US echocardiography  01/25/2008    mild DUST,mild MR,mod. ca+ mitral annular,AOV mildly sclerotid  . Lexiscan mycardial perfusion scan  01/25/2008    Normal  . Inguinal hernia repair Right 06/18/2013  . Tonsillectomy    . Total abdominal hysterectomy    . Cataract extraction w/ intraocular lens  implant, bilateral Bilateral   . Breast biopsy Right   . Breast lumpectomy Right   . Inguinal hernia repair Right 06/18/2013    Procedure: HERNIA REPAIR INGUINAL ADULT;  Surgeon: Kathryne Eriksson  Hulen Skains, MD;  Location: [facility];  Service: General;  Laterality: Right;  . Insertion of mesh Right 06/18/2013    Procedure: INSERTION OF MESH;  Surgeon: Gwenyth Ober, MD;  Location: [facility];  Service: General;  Laterality: Right;  . Left heart catheterization with coronary angiogram N/A 12/21/2011    Procedure: LEFT HEART CATHETERIZATION WITH CORONARY ANGIOGRAM;  Surgeon: Lorretta Harp, MD;  Location: [facility] CATH LAB;  Service: Cardiovascular;  Laterality: N/A;  . Glaucoma surgery Bilateral   . Eye surgery    . Coronary angioplasty with stent placement      "Dr. Alvester Chou put it in; it's been in a long time"  . Coronary angioplasty  03-07-2004    cutting balloon  . Cardiac catheterization  06/05/2004    high grade disease  AV groove CX  . Cardiac catheterization  04/19/2005    noncritical CAD   Family History  Problem Relation Age of Onset  . Colon cancer Mother   . Heart disease Mother   . Stroke Mother    Social History  Substance Use Topics  . Smoking status: Never Smoker   . Smokeless tobacco: Never Used  . Alcohol Use: No   OB History    No data available     Review of Systems  Gastrointestinal: Positive for rectal pain.  All other systems reviewed and are negative.     Allergies  Chicken allergy; Fish allergy; and Percocet  Home Medications   Prior to Admission medications   Medication Sig Start Date End Date Taking? Authorizing Provider  acetaminophen (TYLENOL) 500 MG tablet Take 500 mg by mouth 3 (three) times daily.   Yes Historical Provider, MD  amLODipine (NORVASC) 10 MG tablet Take 10 mg by mouth daily.   Yes Historical Provider, MD  Artificial Tear Ointment (SOOTHE NIGHT TIME) OINT Place 1 application into the right eye 3 (three) times daily.    Yes Historical Provider, MD  aspirin 81 MG chewable tablet Chew 81 mg by mouth daily.   Yes Historical Provider, MD  bisacodyl (DULCOLAX) 10 MG suppository Place 1 suppository (10 mg total) rectally daily as needed (no bowel movement in 24 hours). 08/12/14  Yes Janece Canterbury, MD  cetirizine (ZYRTEC) 5 MG tablet Take 5 mg by mouth every morning.    Yes Historical Provider, MD  cholecalciferol (VITAMIN D) 400 UNITS TABS tablet Take 800 Units by mouth daily.   Yes Historical Provider, MD  citalopram (CELEXA) 20 MG tablet Take 20 mg by mouth daily before breakfast.    Yes Historical Provider, MD  docusate sodium (COLACE) 100 MG capsule Take 100 mg by mouth 2 (two) times daily.   Yes Historical Provider, MD  esomeprazole (NEXIUM) 40 MG capsule Take 40 mg by mouth daily before breakfast.   Yes Historical Provider, MD  fluticasone (FLONASE) 50 MCG/ACT nasal spray Place 2 sprays into both nostrils daily.    Yes Historical Provider, MD   hydrALAZINE (APRESOLINE) 25 MG tablet Take 37.5 mg by mouth 3 (three) times daily.    Yes Historical Provider, MD  isosorbide mononitrate (IMDUR) 60 MG 24 hr tablet Take 1 tablet (60 mg total) by mouth daily before breakfast. 11/16/12  Yes Isaiah Serge, NP  ketorolac (ACULAR) 0.5 % ophthalmic solution Place 1 drop into both eyes daily.   Yes Historical Provider, MD  Lidocaine, Anorectal, 5 % CREA Apply to perianal area as needed for pain. 02/04/15  Yes John Molpus, MD  losartan-hydrochlorothiazide (HYZAAR) 100-25 MG per tablet  Take 1 tablet by mouth daily before breakfast.    Yes Historical Provider, MD  lubiprostone (AMITIZA) 24 MCG capsule Take 1 capsule (24 mcg total) by mouth 2 (two) times daily with a meal. 10/24/12  Yes Gatha Mayer, MD  nitroGLYCERIN (NITROLINGUAL) 0.4 MG/SPRAY spray Place 2 sprays under the tongue every 5 (five) minutes as needed. Use 2 sprays under tongue as needed for chest pain. Repeat 2 sprays if pain persists after 15 minutes if pain still persists   Yes Historical Provider, MD  polyethylene glycol (MIRALAX / GLYCOLAX) packet Take 17 g by mouth 3 (three) times daily. Mix in 8 oz of suitable liquid and drink by mouth every day Patient taking differently: Take 17 g by mouth daily. Mix in 8 oz of suitable liquid and drink by mouth every day 08/12/14  Yes Janece Canterbury, MD  pramoxine (PROCTOFOAM) 1 % foam Place 1 application rectally 3 (three) times daily as needed for itching. 12/12/14  Yes Jennifer Piepenbrink, PA-C  rosuvastatin (CRESTOR) 5 MG tablet Take 5 mg by mouth at bedtime.    Yes Historical Provider, MD  senna (SENOKOT) 8.6 MG TABS Take 1 tablet by mouth 2 (two) times daily.   Yes Historical Provider, MD  spironolactone (ALDACTONE) 25 MG tablet Take 25 mg by mouth daily before breakfast.    Yes Historical Provider, MD  tamsulosin (FLOMAX) 0.4 MG CAPS capsule Take 1 capsule (0.4 mg total) by mouth daily. 08/12/14  Yes Janece Canterbury, MD  traMADol (ULTRAM) 50 MG  tablet Take 50 mg by mouth 2 (two) times daily.   Yes Historical Provider, MD   BP 171/56 mmHg  Pulse 60  Temp(Src) 98.2 F (36.8 C) (Oral)  Resp 20  Wt 136 lb (61.689 kg)  SpO2 98% Physical Exam  Constitutional: She is oriented to person, place, and time. She appears well-developed and well-nourished. No distress.  HENT:  Head: Normocephalic and atraumatic.  Right Ear: Hearing normal.  Left Ear: Hearing normal.  Nose: Nose normal.  Mouth/Throat: Oropharynx is clear and moist and mucous membranes are normal.  Eyes: Conjunctivae and EOM are normal. Pupils are equal, round, and reactive to light.  Neck: Normal range of motion. Neck supple.  Cardiovascular: Regular rhythm, S1 normal and S2 normal.  Exam reveals no gallop and no friction rub.   No murmur heard. Pulmonary/Chest: Effort normal and breath sounds normal. No respiratory distress. She exhibits no tenderness.  Abdominal: Soft. Normal appearance and bowel sounds are normal. There is no hepatosplenomegaly. There is no tenderness. There is no rebound, no guarding, no tenderness at McBurney's point and negative Murphy's sign. No hernia.  Genitourinary:  Rectal prolapse present  Musculoskeletal: Normal range of motion.  Neurological: She is alert and oriented to person, place, and time. She has normal strength. No cranial nerve deficit or sensory deficit. Coordination normal. GCS eye subscore is 4. GCS verbal subscore is 5. GCS motor subscore is 6.  Skin: Skin is warm, dry and intact. No rash noted. No cyanosis.  Psychiatric: She has a normal mood and affect. Her speech is normal and behavior is normal. Thought content normal.  Nursing note and vitals reviewed.   ED Course  Procedures (including critical care time) Labs Review Labs Reviewed  CBC WITH DIFFERENTIAL/PLATELET - Abnormal; Notable for the following:    WBC 3.9 (*)    RBC 3.70 (*)    Hemoglobin 10.6 (*)    HCT 32.4 (*)    All other components within normal limits  BASIC METABOLIC PANEL - Abnormal; Notable for the following:    GFR calc non Af Amer 51 (*)    GFR calc Af Amer 60 (*)    All other components within normal limits    Imaging Review Dg Abd Acute W/chest  03/27/2015   CLINICAL DATA:  Abdominal pain.  Diarrhea.  EXAM: DG ABDOMEN ACUTE W/ 1V CHEST  COMPARISON:  02/11/2015 CT.  FINDINGS: There is no evidence of dilated bowel loops or free intraperitoneal air. No radiopaque calculi or other significant radiographic abnormality is seen. Borderline heart size. Aortic arch atherosclerosis. Severe LEFT glenohumeral osteoarthritis with loose bodies. RIGHT-greater-than-LEFT basilar atelectasis and/ or scarring.  IMPRESSION: Negative abdominal radiographs.  No acute cardiopulmonary disease.   Electronically Signed   By: Dereck Ligas M.D.   On: 03/27/2015 11:15   I have personally reviewed and evaluated these images and lab results as part of my medical decision-making.   EKG Interpretation None      MDM   Final diagnoses:  Rectal prolapse    Patient seen in the ER for rectal pain. Patient has been seen multiple times previously and treated for hemorrhoids. Upon evaluation today, however, she was noted to have a large rectal prolapse. This was reduced with direct manual pressure. Patient continued to have complaints of pain, was treated with analgesia.  Case was discussed with gastroenterology. They recommended continuing her current bowel regimen. She is not experiencing constipation or hard stools currently.  Chest with general surgery. They did not recommend any additional imaging. It was recommended that the patient follow-up with Dr. Marcello Moores or Dr. Johney [place], as they both perform surgeries for this condition. It is not clear if the patient is a surgical candidate, will need to be considered at time of office visit.    Orpah Greek, MD 03/28/15 [phone]

## 2015-03-27 NOTE — ED Notes (Signed)
To ED via [place] from [facility] with c/o prolapsed rectum. Pt states that every time she has a BM "something this big comes out" -- making a fist for example. Pt is blind. Rectal prolapse present on exam.

## 2015-03-27 NOTE — Discharge Instructions (Signed)
You have a rectal prolapse. The rectum can protrude if you bear down to have a bowel movement. It can be pushed back in place safely if this happens. You will need to follow-up with a specific surgeon, Dr. Marcello Moores or Dr. Johney [place] to determine if they can perform a surgery to prevent this.  Abdominal Pain Many things can cause abdominal pain. Usually, abdominal pain is not caused by a disease and will improve without treatment. It can often be observed and treated at home. Your health care provider will do a physical exam and possibly order blood tests and X-rays to help determine the seriousness of your pain. However, in many cases, more time must pass before a clear cause of the pain can be found. Before that point, your health care provider may not know if you need more testing or further treatment. HOME CARE INSTRUCTIONS  Monitor your abdominal pain for any changes. The following actions may help to alleviate any discomfort you are experiencing:  Only take over-the-counter or prescription medicines as directed by your health care provider.  Do not take laxatives unless directed to do so by your health care provider.  Try a clear liquid diet (broth, tea, or water) as directed by your health care provider. Slowly move to a bland diet as tolerated. SEEK MEDICAL CARE IF:  You have unexplained abdominal pain.  You have abdominal pain associated with nausea or diarrhea.  You have pain when you urinate or have a bowel movement.  You experience abdominal pain that wakes you in the night.  You have abdominal pain that is worsened or improved by eating food.  You have abdominal pain that is worsened with eating fatty foods.  You have a fever. SEEK IMMEDIATE MEDICAL CARE IF:   Your pain does not go away within 2 hours.  You keep throwing up (vomiting).  Your pain is felt only in portions of the abdomen, such as the right side or the left lower portion of the abdomen.  You pass bloody or black  tarry stools. MAKE SURE YOU:  Understand these instructions.   Will watch your condition.   Will get help right away if you are not doing well or get worse.  Document Released: 03/24/2005 Document Revised: 06/19/2013 Document Reviewed: 02/21/2013 [facility] Patient Information 2015 [place], [place]. This information is not intended to replace advice given to you by your health care provider. Make sure you discuss any questions you have with your health care provider.

## 2015-04-10 ENCOUNTER — Emergency Department (HOSPITAL_COMMUNITY)
Admission: EM | Admit: 2015-04-10 | Discharge: 2015-04-10 | Disposition: A | Discharge status: Home / Self Care | Payer: Medicare Other | Attending: Emergency Medicine | Admitting: Emergency Medicine

## 2015-04-10 ENCOUNTER — Emergency Department (HOSPITAL_COMMUNITY): Payer: Medicare Other

## 2015-04-10 ENCOUNTER — Encounter (HOSPITAL_COMMUNITY): Payer: Self-pay | Admitting: Emergency Medicine

## 2015-04-10 DIAGNOSIS — G8929 Other chronic pain: Secondary | ICD-10-CM | POA: Insufficient documentation

## 2015-04-10 DIAGNOSIS — I10 Essential (primary) hypertension: Secondary | ICD-10-CM | POA: Diagnosis not present

## 2015-04-10 DIAGNOSIS — S0990XA Unspecified injury of head, initial encounter: Secondary | ICD-10-CM | POA: Insufficient documentation

## 2015-04-10 DIAGNOSIS — Z791 Long term (current) use of non-steroidal anti-inflammatories (NSAID): Secondary | ICD-10-CM | POA: Insufficient documentation

## 2015-04-10 DIAGNOSIS — Z9841 Cataract extraction status, right eye: Secondary | ICD-10-CM | POA: Insufficient documentation

## 2015-04-10 DIAGNOSIS — W19XXXA Unspecified fall, initial encounter: Secondary | ICD-10-CM

## 2015-04-10 DIAGNOSIS — G4733 Obstructive sleep apnea (adult) (pediatric): Secondary | ICD-10-CM | POA: Diagnosis not present

## 2015-04-10 DIAGNOSIS — Z9981 Dependence on supplemental oxygen: Secondary | ICD-10-CM | POA: Insufficient documentation

## 2015-04-10 DIAGNOSIS — E119 Type 2 diabetes mellitus without complications: Secondary | ICD-10-CM | POA: Diagnosis not present

## 2015-04-10 DIAGNOSIS — Y998 Other external cause status: Secondary | ICD-10-CM | POA: Diagnosis not present

## 2015-04-10 DIAGNOSIS — Z7982 Long term (current) use of aspirin: Secondary | ICD-10-CM | POA: Diagnosis not present

## 2015-04-10 DIAGNOSIS — Z9842 Cataract extraction status, left eye: Secondary | ICD-10-CM | POA: Insufficient documentation

## 2015-04-10 DIAGNOSIS — Z9889 Other specified postprocedural states: Secondary | ICD-10-CM | POA: Insufficient documentation

## 2015-04-10 DIAGNOSIS — M1990 Unspecified osteoarthritis, unspecified site: Secondary | ICD-10-CM | POA: Diagnosis not present

## 2015-04-10 DIAGNOSIS — Z862 Personal history of diseases of the blood and blood-forming organs and certain disorders involving the immune mechanism: Secondary | ICD-10-CM | POA: Insufficient documentation

## 2015-04-10 DIAGNOSIS — F419 Anxiety disorder, unspecified: Secondary | ICD-10-CM | POA: Insufficient documentation

## 2015-04-10 DIAGNOSIS — Y92128 Other place in nursing home as the place of occurrence of the external cause: Secondary | ICD-10-CM | POA: Diagnosis not present

## 2015-04-10 DIAGNOSIS — E669 Obesity, unspecified: Secondary | ICD-10-CM | POA: Diagnosis not present

## 2015-04-10 DIAGNOSIS — S4992XA Unspecified injury of left shoulder and upper arm, initial encounter: Secondary | ICD-10-CM | POA: Insufficient documentation

## 2015-04-10 DIAGNOSIS — Y9389 Activity, other specified: Secondary | ICD-10-CM | POA: Diagnosis not present

## 2015-04-10 DIAGNOSIS — I252 Old myocardial infarction: Secondary | ICD-10-CM | POA: Diagnosis not present

## 2015-04-10 DIAGNOSIS — I2510 Atherosclerotic heart disease of native coronary artery without angina pectoris: Secondary | ICD-10-CM | POA: Insufficient documentation

## 2015-04-10 DIAGNOSIS — I509 Heart failure, unspecified: Secondary | ICD-10-CM | POA: Diagnosis not present

## 2015-04-10 DIAGNOSIS — Z79899 Other long term (current) drug therapy: Secondary | ICD-10-CM | POA: Diagnosis not present

## 2015-04-10 DIAGNOSIS — Z7951 Long term (current) use of inhaled steroids: Secondary | ICD-10-CM | POA: Diagnosis not present

## 2015-04-10 DIAGNOSIS — Z87442 Personal history of urinary calculi: Secondary | ICD-10-CM | POA: Diagnosis not present

## 2015-04-10 DIAGNOSIS — Z955 Presence of coronary angioplasty implant and graft: Secondary | ICD-10-CM | POA: Diagnosis not present

## 2015-04-10 DIAGNOSIS — Z86010 Personal history of colonic polyps: Secondary | ICD-10-CM | POA: Insufficient documentation

## 2015-04-10 DIAGNOSIS — E785 Hyperlipidemia, unspecified: Secondary | ICD-10-CM | POA: Diagnosis not present

## 2015-04-10 DIAGNOSIS — K219 Gastro-esophageal reflux disease without esophagitis: Secondary | ICD-10-CM | POA: Insufficient documentation

## 2015-04-10 LAB — COMPREHENSIVE METABOLIC PANEL
ALT: 19 U/L (ref 14–54)
ANION GAP: 7 (ref 5–15)
AST: 29 U/L (ref 15–41)
Albumin: 3.8 g/dL (ref 3.5–5.0)
Alkaline Phosphatase: 42 U/L (ref 38–126)
BILIRUBIN TOTAL: 0.6 mg/dL (ref 0.3–1.2)
BUN: 17 mg/dL (ref 6–20)
CO2: 25 mmol/L (ref 22–32)
Calcium: 9.2 mg/dL (ref 8.9–10.3)
Chloride: 106 mmol/L (ref 101–111)
Creatinine, Ser: 0.76 mg/dL (ref 0.44–1.00)
GFR calc Af Amer: >60 mL/min (ref >60)
GFR calc non Af Amer: >60 mL/min (ref >60)
Glucose, Bld: 100 mg/dL — ABNORMAL HIGH (ref 65–99)
POTASSIUM: 3.2 mmol/L — AB (ref 3.5–5.1)
SODIUM: 138 mmol/L (ref 135–145)
Total Protein: 6.7 g/dL (ref 6.5–8.1)

## 2015-04-10 LAB — URINALYSIS, ROUTINE W REFLEX MICROSCOPIC
Bilirubin Urine: NEGATIVE
Glucose, UA: NEGATIVE mg/dL
Hgb urine dipstick: NEGATIVE
Ketones, ur: NEGATIVE mg/dL
Leukocytes, UA: NEGATIVE
Nitrite: NEGATIVE
PROTEIN: NEGATIVE mg/dL
SPECIFIC GRAVITY, URINE: 1.014 (ref 1.005–1.030)
Urobilinogen, UA: 1 mg/dL (ref 0.0–1.0)
pH: 7.5 (ref 5.0–8.0)

## 2015-04-10 LAB — CBC WITH DIFFERENTIAL/PLATELET
BASOS PCT: 0 %
Basophils Absolute: 0 10*3/uL (ref 0.0–0.1)
EOS ABS: 0 10*3/uL (ref 0.0–0.7)
Eosinophils Relative: 1 %
HEMATOCRIT: 30.2 % — AB (ref 36.0–46.0)
Hemoglobin: 10.1 g/dL — ABNORMAL LOW (ref 12.0–15.0)
Lymphocytes Relative: 25 %
Lymphs Abs: 0.8 10*3/uL (ref 0.7–4.0)
MCH: 29.3 pg (ref 26.0–34.0)
MCHC: 33.4 g/dL (ref 30.0–36.0)
MCV: 87.5 fL (ref 78.0–100.0)
MONO ABS: 0.5 10*3/uL (ref 0.1–1.0)
MONOS PCT: 15 %
Neutro Abs: 2 10*3/uL (ref 1.7–7.7)
Neutrophils Relative %: 59 %
Platelets: 232 10*3/uL (ref 150–400)
RBC: 3.45 MIL/uL — ABNORMAL LOW (ref 3.87–5.11)
RDW: 14.6 % (ref 11.5–15.5)
WBC: 3.4 10*3/uL — ABNORMAL LOW (ref 4.0–10.5)

## 2015-04-10 LAB — LIPASE, BLOOD: Lipase: 39 U/L (ref 22–51)

## 2015-04-10 MED ORDER — FENTANYL CITRATE (PF) 100 MCG/2ML IJ SOLN
25.0000 ug | Freq: Once | INTRAMUSCULAR | Status: AC
  Administered 2015-04-10: 25 ug via INTRAVENOUS
  Filled 2015-04-10: qty 2

## 2015-04-10 MED ORDER — ACETAMINOPHEN 500 MG PO TABS: 500.0000 mg | ORAL_TABLET | Freq: Four times a day (QID) | ORAL | Status: DC | PRN

## 2015-04-10 NOTE — ED Notes (Signed)
Called report to facility.

## 2015-04-10 NOTE — Discharge Instructions (Signed)
Fall Prevention in the Home  Falls can cause injuries and can affect people from all age groups. There are many simple things that you can do to make your home safe and to help prevent falls. WHAT CAN I DO ON THE OUTSIDE OF MY HOME?  Regularly repair the edges of walkways and driveways and fix any cracks.  Remove high doorway thresholds.  Trim any shrubbery on the main path into your home.  Use bright outdoor lighting.  Clear walkways of debris and clutter, including tools and rocks.  Regularly check that handrails are securely fastened and in good repair. Both sides of any steps should have handrails.  Install guardrails along the edges of any raised decks or porches.  Have leaves, snow, and ice cleared regularly.  Use sand or salt on walkways during winter months.  In the garage, clean up any spills right away, including grease or oil spills. WHAT CAN I DO IN THE BATHROOM?  Use night lights.  Install grab bars by the toilet and in the tub and shower. Do not use towel bars as grab bars.  Use non-skid mats or decals on the floor of the tub or shower.  If you need to sit down while you are in the shower, use a plastic, non-slip stool..  Keep the floor dry. Immediately clean up any water that spills on the floor.  Remove soap buildup in the tub or shower on a regular basis.  Attach bath mats securely with double-sided non-slip rug tape.  Remove throw rugs and other tripping hazards from the floor. WHAT CAN I DO IN THE BEDROOM?  Use night lights.  Make sure that a bedside light is easy to reach.  Do not use oversized bedding that drapes onto the floor.  Have a firm chair that has side arms to use for getting dressed.  Remove throw rugs and other tripping hazards from the floor. WHAT CAN I DO IN THE KITCHEN?   Clean up any spills right away.  Avoid walking on wet floors.  Place frequently used items in easy-to-reach places.  If you need to reach for something  above you, use a sturdy step stool that has a grab bar.  Keep electrical cables out of the way.  Do not use floor polish or wax that makes floors slippery. If you have to use wax, make sure that it is non-skid floor wax.  Remove throw rugs and other tripping hazards from the floor. WHAT CAN I DO IN THE STAIRWAYS?  Do not leave any items on the stairs.  Make sure that there are handrails on both sides of the stairs. Fix handrails that are broken or loose. Make sure that handrails are as long as the stairways.  Check any carpeting to make sure that it is firmly attached to the stairs. Fix any carpet that is loose or worn.  Avoid having throw rugs at the top or bottom of stairways, or secure the rugs with carpet tape to prevent them from moving.  Make sure that you have a light switch at the top of the stairs and the bottom of the stairs. If you do not have them, have them installed. WHAT ARE SOME OTHER FALL PREVENTION TIPS?  Wear closed-toe shoes that fit well and support your feet. Wear shoes that have rubber soles or low heels.  When you use a stepladder, make sure that it is completely opened and that the sides are firmly locked. Have someone hold the ladder while you   are using it. Do not climb a closed stepladder.  Add color or contrast paint or tape to grab bars and handrails in your home. Place contrasting color strips on the first and last steps.  Use mobility aids as needed, such as canes, walkers, scooters, and crutches.  Turn on lights if it is dark. Replace any light bulbs that burn out.  Set up furniture so that there are clear paths. Keep the furniture in the same spot.  Fix any uneven floor surfaces.  Choose a carpet design that does not hide the edge of steps of a stairway.  Be aware of any and all pets.  Review your medicines with your healthcare provider. Some medicines can cause dizziness or changes in blood pressure, which increase your risk of falling. Talk  with your health care provider about other ways that you can decrease your risk of falls. This may include working with a physical therapist or trainer to improve your strength, balance, and endurance.   This information is not intended to replace advice given to you by your health care provider. Make sure you discuss any questions you have with your health care provider.   Document Released: 06/04/2002 Document Revised: 10/29/2014 Document Reviewed: 07/19/2014 [employer] Interactive Patient Education 2016 [employer]  
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
## 2015-04-10 NOTE — ED Notes (Signed)
Bed: WA08 Expected date:  Expected time:  Means of arrival:  Comments: Ems-fall

## 2015-04-10 NOTE — ED Notes (Signed)
Called [facility] for transportation  
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
## 2015-04-24 ENCOUNTER — Ambulatory Visit: Payer: Medicare Other | Admitting: Gastroenterology

## 2015-04-30 ENCOUNTER — Ambulatory Visit (INDEPENDENT_AMBULATORY_CARE_PROVIDER_SITE_OTHER): Payer: Medicare Other | Admitting: Gastroenterology

## 2015-04-30 ENCOUNTER — Encounter: Payer: Self-pay | Admitting: Gastroenterology

## 2015-04-30 VITALS — BP 152/60 | HR 80 | Ht 65.0 in | Wt 128.8 lb

## 2015-04-30 DIAGNOSIS — R159 Full incontinence of feces: Secondary | ICD-10-CM | POA: Insufficient documentation

## 2015-04-30 DIAGNOSIS — K623 Rectal prolapse: Secondary | ICD-10-CM | POA: Diagnosis not present

## 2015-04-30 NOTE — Patient Instructions (Signed)
Please STOP taking the Senokot and Amitiza.   Continue the Miralax as needed.  Continue the stool softner daily.

## 2015-04-30 NOTE — Progress Notes (Signed)
     04/30/2015 [name] [id number] [birth year]   History of Present Illness:  This is an 81 year old female who is previously known to Dr. Carlean Purl.  She had a colonoscopy for rectal bleeding in 09/2012 at which time she was found to have 3 polyps that were removed, moderate diverticulosis, mild melanosis coli, and small external hemorrhoids.  Polyps were tubular adenomas but no recall was recommended due to age.  She is here today for complaints of fecal incontinence.  Previously had issues with constipation so she is still on Amitiza 24 mcg BID, senokot BID, colace stool softeners daily, and Miralax prn.  She is having very soft/loose stools 2-4 times per day and does not make it to the bathroom in time.  Patient does have very poor sphincter tone and has rectal prolapse.  Seen by [facility] and not a surgical candidate.   Current Medications, Allergies, Past Medical History, Past Surgical History, Family History and Social History were reviewed in [employer] record.   Physical Exam: BP 152/60 mmHg  Pulse 80  Ht 5\' 5"  (1.651 m)  Wt 128 lb 12.8 oz (58.423 kg)  BMI 21.43 kg/m2 General: Well developed black female in no acute distress Head: Normocephalic and atraumatic Eyes:  Sclerae anicteric, conjunctiva pink  Ears: Normal auditory acuity Heart: Regular rate and rhythm Abdomen: Soft, non-distended.  Slightly hyperactive bowel sounds.  Non-tender. Rectal:  No external abnormalities identified.  DRE did not reveal and fecal impaction or masses.  She has poor sphincter tone. Musculoskeletal: Symmetrical with no gross deformities  Extremities: No edema  Neurological: Alert oriented x 4, grossly non-focal Psychological:  Alert and cooperative. Normal mood and affect  Assessment and Recommendations: -Fecal incontinence:  Patient does have very poor sphincter tone and has rectal prolapse.  Seen by [facility] and not a surgical candidate.  She is on amitiza BID and senokot BID  for previous constipation, which we will discontinue for now.  Continue with only daily fiber supplement and Miralax prn for now to see if we can avoid stools begin too soft/loose. Unfortunately there is not much else to do medically for this type of situation.

## 2015-05-07 NOTE — Progress Notes (Signed)
Agree with Ms. Zehr's management.  Carl E. Gessner, MD, FACG  

## 2015-06-15 ENCOUNTER — Encounter (HOSPITAL_COMMUNITY): Payer: Self-pay | Admitting: Nurse Practitioner

## 2015-06-15 ENCOUNTER — Emergency Department (HOSPITAL_COMMUNITY): Payer: Medicare Other

## 2015-06-15 ENCOUNTER — Emergency Department (HOSPITAL_COMMUNITY)
Admission: EM | Admit: 2015-06-15 | Discharge: 2015-06-15 | Disposition: A | Discharge status: Home / Self Care | Payer: Medicare Other | Attending: Emergency Medicine | Admitting: Emergency Medicine

## 2015-06-15 DIAGNOSIS — E119 Type 2 diabetes mellitus without complications: Secondary | ICD-10-CM | POA: Diagnosis not present

## 2015-06-15 DIAGNOSIS — M1990 Unspecified osteoarthritis, unspecified site: Secondary | ICD-10-CM | POA: Insufficient documentation

## 2015-06-15 DIAGNOSIS — S4992XA Unspecified injury of left shoulder and upper arm, initial encounter: Secondary | ICD-10-CM | POA: Insufficient documentation

## 2015-06-15 DIAGNOSIS — I2510 Atherosclerotic heart disease of native coronary artery without angina pectoris: Secondary | ICD-10-CM | POA: Insufficient documentation

## 2015-06-15 DIAGNOSIS — K5903 Drug induced constipation: Secondary | ICD-10-CM | POA: Insufficient documentation

## 2015-06-15 DIAGNOSIS — Z86010 Personal history of colonic polyps: Secondary | ICD-10-CM | POA: Insufficient documentation

## 2015-06-15 DIAGNOSIS — Z87448 Personal history of other diseases of urinary system: Secondary | ICD-10-CM | POA: Diagnosis not present

## 2015-06-15 DIAGNOSIS — M25512 Pain in left shoulder: Secondary | ICD-10-CM

## 2015-06-15 DIAGNOSIS — Z7982 Long term (current) use of aspirin: Secondary | ICD-10-CM | POA: Diagnosis not present

## 2015-06-15 DIAGNOSIS — K219 Gastro-esophageal reflux disease without esophagitis: Secondary | ICD-10-CM | POA: Insufficient documentation

## 2015-06-15 DIAGNOSIS — S199XXA Unspecified injury of neck, initial encounter: Secondary | ICD-10-CM | POA: Diagnosis present

## 2015-06-15 DIAGNOSIS — Z791 Long term (current) use of non-steroidal anti-inflammatories (NSAID): Secondary | ICD-10-CM | POA: Insufficient documentation

## 2015-06-15 DIAGNOSIS — Z9889 Other specified postprocedural states: Secondary | ICD-10-CM | POA: Insufficient documentation

## 2015-06-15 DIAGNOSIS — G8929 Other chronic pain: Secondary | ICD-10-CM | POA: Insufficient documentation

## 2015-06-15 DIAGNOSIS — Z7952 Long term (current) use of systemic steroids: Secondary | ICD-10-CM | POA: Diagnosis not present

## 2015-06-15 DIAGNOSIS — F419 Anxiety disorder, unspecified: Secondary | ICD-10-CM | POA: Insufficient documentation

## 2015-06-15 DIAGNOSIS — Z79899 Other long term (current) drug therapy: Secondary | ICD-10-CM | POA: Insufficient documentation

## 2015-06-15 DIAGNOSIS — G4733 Obstructive sleep apnea (adult) (pediatric): Secondary | ICD-10-CM | POA: Insufficient documentation

## 2015-06-15 DIAGNOSIS — F0390 Unspecified dementia without behavioral disturbance: Secondary | ICD-10-CM | POA: Diagnosis not present

## 2015-06-15 DIAGNOSIS — W010XXA Fall on same level from slipping, tripping and stumbling without subsequent striking against object, initial encounter: Secondary | ICD-10-CM | POA: Diagnosis not present

## 2015-06-15 DIAGNOSIS — Y9389 Activity, other specified: Secondary | ICD-10-CM | POA: Insufficient documentation

## 2015-06-15 DIAGNOSIS — I509 Heart failure, unspecified: Secondary | ICD-10-CM | POA: Insufficient documentation

## 2015-06-15 DIAGNOSIS — Z7951 Long term (current) use of inhaled steroids: Secondary | ICD-10-CM | POA: Insufficient documentation

## 2015-06-15 DIAGNOSIS — E669 Obesity, unspecified: Secondary | ICD-10-CM | POA: Diagnosis not present

## 2015-06-15 DIAGNOSIS — E785 Hyperlipidemia, unspecified: Secondary | ICD-10-CM | POA: Insufficient documentation

## 2015-06-15 DIAGNOSIS — Z9861 Coronary angioplasty status: Secondary | ICD-10-CM | POA: Insufficient documentation

## 2015-06-15 DIAGNOSIS — I252 Old myocardial infarction: Secondary | ICD-10-CM | POA: Diagnosis not present

## 2015-06-15 DIAGNOSIS — M542 Cervicalgia: Secondary | ICD-10-CM

## 2015-06-15 DIAGNOSIS — Z853 Personal history of malignant neoplasm of breast: Secondary | ICD-10-CM | POA: Insufficient documentation

## 2015-06-15 DIAGNOSIS — Z9981 Dependence on supplemental oxygen: Secondary | ICD-10-CM | POA: Insufficient documentation

## 2015-06-15 DIAGNOSIS — Y92129 Unspecified place in nursing home as the place of occurrence of the external cause: Secondary | ICD-10-CM | POA: Insufficient documentation

## 2015-06-15 DIAGNOSIS — I10 Essential (primary) hypertension: Secondary | ICD-10-CM | POA: Insufficient documentation

## 2015-06-15 DIAGNOSIS — Y998 Other external cause status: Secondary | ICD-10-CM | POA: Insufficient documentation

## 2015-06-15 DIAGNOSIS — W19XXXA Unspecified fall, initial encounter: Secondary | ICD-10-CM

## 2015-06-15 NOTE — ED Notes (Signed)
[facility] called for transport.  
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
## 2015-06-15 NOTE — ED Notes (Signed)
Bed: [id number] Expected date: 06/15/15 Expected time: 10:01 AM Means of arrival: Ambulance Comments: Fall

## 2015-06-15 NOTE — ED Notes (Signed)
Pt fell after tripping. Denies pain or injury. Lives at [facility]. History of alzheimer's dementia.

## 2015-06-15 NOTE — Discharge Instructions (Signed)
1. Medications: usual home medications 2. Treatment: rest, drink plenty of fluids 3. Follow Up: please followup with your primary doctor this week for discussion of your diagnoses and further evaluation after today's visit; if you do not have a primary care doctor use the resource guide provided to find one; please return to the ER for new or worsening symptoms    Musculoskeletal Pain Musculoskeletal pain is muscle and boney aches and pains. These pains can occur in any part of the body. Your caregiver may treat you without knowing the cause of the pain. They may treat you if blood or urine tests, X-rays, and other tests were normal.  CAUSES There is often not a definite cause or reason for these pains. These pains may be caused by a type of germ (virus). The discomfort may also come from overuse. Overuse includes working out too hard when your body is not fit. Boney aches also come from weather changes. Bone is sensitive to atmospheric pressure changes. HOME CARE INSTRUCTIONS   Ask when your test results will be ready. Make sure you get your test results.  Only take over-the-counter or prescription medicines for pain, discomfort, or fever as directed by your caregiver. If you were given medications for your condition, do not drive, operate machinery or power tools, or sign legal documents for 24 hours. Do not drink alcohol. Do not take sleeping pills or other medications that may interfere with treatment.  Continue all activities unless the activities cause more pain. When the pain lessens, slowly resume normal activities. Gradually increase the intensity and duration of the activities or exercise.  During periods of severe pain, bed rest may be helpful. Lay or sit in any position that is comfortable.  Putting ice on the injured area.  Put ice in a bag.  Place a towel between your skin and the bag.  Leave the ice on for 15 to 20 minutes, 3 to 4 times a day.  Follow up with your caregiver  for continued problems and no reason can be found for the pain. If the pain becomes worse or does not go away, it may be necessary to repeat tests or do additional testing. Your caregiver may need to look further for a possible cause. SEEK IMMEDIATE MEDICAL CARE IF:  You have pain that is getting worse and is not relieved by medications.  You develop chest pain that is associated with shortness or breath, sweating, feeling sick to your stomach (nauseous), or throw up (vomit).  Your pain becomes localized to the abdomen.  You develop any new symptoms that seem different or that concern you. MAKE SURE YOU:   Understand these instructions.  Will watch your condition.  Will get help right away if you are not doing well or get worse.   This information is not intended to replace advice given to you by your health care provider. Make sure you discuss any questions you have with your health care provider.   Document Released: 06/14/2005 Document Revised: 09/06/2011 Document Reviewed: 02/16/2013 Elsevier Interactive Patient Education [employer].

## 2015-08-29 ENCOUNTER — Emergency Department (HOSPITAL_COMMUNITY): Payer: Medicare Other

## 2015-08-29 ENCOUNTER — Emergency Department (HOSPITAL_COMMUNITY)
Admission: EM | Admit: 2015-08-29 | Discharge: 2015-08-29 | Disposition: A | Discharge status: Home / Self Care | Payer: Medicare Other | Attending: Emergency Medicine | Admitting: Emergency Medicine

## 2015-08-29 ENCOUNTER — Encounter (HOSPITAL_COMMUNITY): Payer: Self-pay | Admitting: Emergency Medicine

## 2015-08-29 DIAGNOSIS — Z7982 Long term (current) use of aspirin: Secondary | ICD-10-CM | POA: Diagnosis not present

## 2015-08-29 DIAGNOSIS — Z862 Personal history of diseases of the blood and blood-forming organs and certain disorders involving the immune mechanism: Secondary | ICD-10-CM | POA: Diagnosis not present

## 2015-08-29 DIAGNOSIS — E669 Obesity, unspecified: Secondary | ICD-10-CM | POA: Diagnosis not present

## 2015-08-29 DIAGNOSIS — G4733 Obstructive sleep apnea (adult) (pediatric): Secondary | ICD-10-CM | POA: Diagnosis not present

## 2015-08-29 DIAGNOSIS — Z79899 Other long term (current) drug therapy: Secondary | ICD-10-CM | POA: Diagnosis not present

## 2015-08-29 DIAGNOSIS — E785 Hyperlipidemia, unspecified: Secondary | ICD-10-CM | POA: Diagnosis not present

## 2015-08-29 DIAGNOSIS — Y9289 Other specified places as the place of occurrence of the external cause: Secondary | ICD-10-CM | POA: Insufficient documentation

## 2015-08-29 DIAGNOSIS — Z853 Personal history of malignant neoplasm of breast: Secondary | ICD-10-CM | POA: Diagnosis not present

## 2015-08-29 DIAGNOSIS — W19XXXA Unspecified fall, initial encounter: Secondary | ICD-10-CM

## 2015-08-29 DIAGNOSIS — I10 Essential (primary) hypertension: Secondary | ICD-10-CM | POA: Diagnosis not present

## 2015-08-29 DIAGNOSIS — W06XXXA Fall from bed, initial encounter: Secondary | ICD-10-CM | POA: Diagnosis not present

## 2015-08-29 DIAGNOSIS — I4891 Unspecified atrial fibrillation: Secondary | ICD-10-CM | POA: Insufficient documentation

## 2015-08-29 DIAGNOSIS — Z86010 Personal history of colonic polyps: Secondary | ICD-10-CM | POA: Insufficient documentation

## 2015-08-29 DIAGNOSIS — Z7951 Long term (current) use of inhaled steroids: Secondary | ICD-10-CM | POA: Diagnosis not present

## 2015-08-29 DIAGNOSIS — F0390 Unspecified dementia without behavioral disturbance: Secondary | ICD-10-CM | POA: Insufficient documentation

## 2015-08-29 DIAGNOSIS — G8929 Other chronic pain: Secondary | ICD-10-CM | POA: Diagnosis not present

## 2015-08-29 DIAGNOSIS — Y998 Other external cause status: Secondary | ICD-10-CM | POA: Diagnosis not present

## 2015-08-29 DIAGNOSIS — E119 Type 2 diabetes mellitus without complications: Secondary | ICD-10-CM | POA: Insufficient documentation

## 2015-08-29 DIAGNOSIS — I509 Heart failure, unspecified: Secondary | ICD-10-CM | POA: Diagnosis not present

## 2015-08-29 DIAGNOSIS — Z87442 Personal history of urinary calculi: Secondary | ICD-10-CM | POA: Diagnosis not present

## 2015-08-29 DIAGNOSIS — M5030 Other cervical disc degeneration, unspecified cervical region: Secondary | ICD-10-CM | POA: Diagnosis not present

## 2015-08-29 DIAGNOSIS — Z7952 Long term (current) use of systemic steroids: Secondary | ICD-10-CM | POA: Insufficient documentation

## 2015-08-29 DIAGNOSIS — Y9389 Activity, other specified: Secondary | ICD-10-CM | POA: Insufficient documentation

## 2015-08-29 DIAGNOSIS — F419 Anxiety disorder, unspecified: Secondary | ICD-10-CM | POA: Insufficient documentation

## 2015-08-29 DIAGNOSIS — S0990XA Unspecified injury of head, initial encounter: Secondary | ICD-10-CM | POA: Insufficient documentation

## 2015-08-29 DIAGNOSIS — Z9981 Dependence on supplemental oxygen: Secondary | ICD-10-CM | POA: Diagnosis not present

## 2015-08-29 DIAGNOSIS — M1990 Unspecified osteoarthritis, unspecified site: Secondary | ICD-10-CM | POA: Insufficient documentation

## 2015-08-29 DIAGNOSIS — I252 Old myocardial infarction: Secondary | ICD-10-CM | POA: Insufficient documentation

## 2015-08-29 DIAGNOSIS — I2510 Atherosclerotic heart disease of native coronary artery without angina pectoris: Secondary | ICD-10-CM | POA: Diagnosis not present

## 2015-08-29 LAB — CBC WITH DIFFERENTIAL/PLATELET
BASOS PCT: 0 %
Basophils Absolute: 0 10*3/uL (ref 0.0–0.1)
EOS ABS: 0.1 10*3/uL (ref 0.0–0.7)
EOS PCT: 1 %
HCT: 34.5 % — ABNORMAL LOW (ref 36.0–46.0)
HEMOGLOBIN: 11.4 g/dL — AB (ref 12.0–15.0)
Lymphocytes Relative: 23 %
Lymphs Abs: 1 10*3/uL (ref 0.7–4.0)
MCH: 29.7 pg (ref 26.0–34.0)
MCHC: 33 g/dL (ref 30.0–36.0)
MCV: 89.8 fL (ref 78.0–100.0)
MONOS PCT: 12 %
Monocytes Absolute: 0.5 10*3/uL (ref 0.1–1.0)
Neutro Abs: 2.8 10*3/uL (ref 1.7–7.7)
Neutrophils Relative %: 64 %
PLATELETS: 234 10*3/uL (ref 150–400)
RBC: 3.84 MIL/uL — ABNORMAL LOW (ref 3.87–5.11)
RDW: 15.1 % (ref 11.5–15.5)
WBC: 4.4 10*3/uL (ref 4.0–10.5)

## 2015-08-29 LAB — URINALYSIS, ROUTINE W REFLEX MICROSCOPIC
BILIRUBIN URINE: NEGATIVE
GLUCOSE, UA: NEGATIVE mg/dL
Hgb urine dipstick: NEGATIVE
Ketones, ur: NEGATIVE mg/dL
LEUKOCYTES UA: NEGATIVE
Nitrite: NEGATIVE
PH: 7 (ref 5.0–8.0)
PROTEIN: NEGATIVE mg/dL
Specific Gravity, Urine: 1.013 (ref 1.005–1.030)

## 2015-08-29 LAB — COMPREHENSIVE METABOLIC PANEL
ALBUMIN: 4.1 g/dL (ref 3.5–5.0)
ALK PHOS: 35 U/L — AB (ref 38–126)
ALT: 19 U/L (ref 14–54)
ANION GAP: 7 (ref 5–15)
AST: 24 U/L (ref 15–41)
BUN: 22 mg/dL — ABNORMAL HIGH (ref 6–20)
CALCIUM: 9.6 mg/dL (ref 8.9–10.3)
CHLORIDE: 108 mmol/L (ref 101–111)
CO2: 25 mmol/L (ref 22–32)
CREATININE: 0.81 mg/dL (ref 0.44–1.00)
GFR calc Af Amer: >60 mL/min (ref >60)
GFR calc non Af Amer: >60 mL/min (ref >60)
GLUCOSE: 94 mg/dL (ref 65–99)
POTASSIUM: 3.9 mmol/L (ref 3.5–5.1)
SODIUM: 140 mmol/L (ref 135–145)
Total Bilirubin: 0.9 mg/dL (ref 0.3–1.2)
Total Protein: 6.9 g/dL (ref 6.5–8.1)

## 2015-08-29 MED ORDER — ONDANSETRON 4 MG PO TBDP
4.0000 mg | ORAL_TABLET | Freq: Once | ORAL | Status: AC
  Administered 2015-08-29: 4 mg via ORAL
  Filled 2015-08-29: qty 1

## 2015-08-29 MED ORDER — HYDRALAZINE HCL 25 MG PO TABS
37.5000 mg | ORAL_TABLET | Freq: Once | ORAL | Status: AC
  Administered 2015-08-29: 37.5 mg via ORAL
  Filled 2015-08-29: qty 1.5

## 2015-08-29 MED ORDER — HYDROCODONE-ACETAMINOPHEN 5-325 MG PO TABS
1.0000 | ORAL_TABLET | Freq: Once | ORAL | Status: AC
  Administered 2015-08-29: 1 via ORAL
  Filled 2015-08-29: qty 1
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
## 2015-08-29 NOTE — ED Notes (Signed)
BLOOD DRAW DELAYED, PT CURRENTLY HAVING SCANS DONE.

## 2015-08-29 NOTE — ED Notes (Signed)
[facility] contacted for transportation.  

## 2015-08-29 NOTE — Discharge Instructions (Signed)
You have degenerative changes of your cervical spine with severe narrowing of your cervical canal at C5-C6. Given you have no neurologic deficits do not feel this needs emergent neurological evaluation and given your age and comorbidities aren't likely a poor surgical candidate. If you began having neck pain, numbness or weakness in your extremities, difficulty holding your bowel or bladder which is new for you, please return to the emergency department. I recommend you follow-up with a neurosurgeon as an outpatient.   You're also found to have a lung nodule. You should follow up with your primary care physician to follow this.   You're also found to have a hypodensity in your thyroid. You should follow up with your primary care physician for an outpatient ultrasound as needed.   Hypertension Hypertension, commonly called high blood pressure, is when the force of blood pumping through your arteries is too strong. Your arteries are the blood vessels that carry blood from your heart throughout your body. A blood pressure reading consists of a higher number over a lower number, such as 110/72. The higher number (systolic) is the pressure inside your arteries when your heart pumps. The lower number (diastolic) is the pressure inside your arteries when your heart relaxes. Ideally you want your blood pressure below 120/80. Hypertension forces your heart to work harder to pump blood. Your arteries may become narrow or stiff. Having untreated or uncontrolled hypertension can cause heart attack, stroke, kidney disease, and other problems. RISK FACTORS Some risk factors for high blood pressure are controllable. Others are not.  Risk factors you cannot control include:   Race. You may be at higher risk if you are African American.  Age. Risk increases with age.  Gender. Men are at higher risk than women before age 34 years. After age 43, women are at higher risk than men. Risk factors you can control  include:  Not getting enough exercise or physical activity.  Being overweight.  Getting too much fat, sugar, calories, or salt in your diet.  Drinking too much alcohol. SIGNS AND SYMPTOMS Hypertension does not usually cause signs or symptoms. Extremely high blood pressure (hypertensive crisis) may cause headache, anxiety, shortness of breath, and nosebleed. DIAGNOSIS To check if you have hypertension, your health care provider will measure your blood pressure while you are seated, with your arm held at the level of your heart. It should be measured at least twice using the same arm. Certain conditions can cause a difference in blood pressure between your right and left arms. A blood pressure reading that is higher than normal on one occasion does not mean that you need treatment. If it is not clear whether you have high blood pressure, you may be asked to return on a different day to have your blood pressure checked again. Or, you may be asked to monitor your blood pressure at home for 1 or more weeks. TREATMENT Treating high blood pressure includes making lifestyle changes and possibly taking medicine. Living a healthy lifestyle can help lower high blood pressure. You may need to change some of your habits. Lifestyle changes may include:  Following the DASH diet. This diet is high in fruits, vegetables, and whole grains. It is low in salt, red meat, and added sugars.  Keep your sodium intake below 2,300 mg per day.  Getting at least 30-45 minutes of aerobic exercise at least 4 times per week.  Losing weight if necessary.  Not smoking.  Limiting alcoholic beverages.  Learning ways to reduce  stress. Your health care provider may prescribe medicine if lifestyle changes are not enough to get your blood pressure under control, and if one of the following is true:  You are 10-26 years of age and your systolic blood pressure is above 140.  You are 78 years of age or older, and your  systolic blood pressure is above 150.  Your diastolic blood pressure is above 90.  You have diabetes, and your systolic blood pressure is over [id number] or your diastolic blood pressure is over 90.  You have kidney disease and your blood pressure is above 140/90.  You have heart disease and your blood pressure is above 140/90. Your personal target blood pressure may vary depending on your medical conditions, your age, and other factors. HOME CARE INSTRUCTIONS  Have your blood pressure rechecked as directed by your health care provider.   Take medicines only as directed by your health care provider. Follow the directions carefully. Blood pressure medicines must be taken as prescribed. The medicine does not work as well when you skip doses. Skipping doses also puts you at risk for problems.  Do not smoke.   Monitor your blood pressure at home as directed by your health care provider. SEEK MEDICAL CARE IF:   You think you are having a reaction to medicines taken.  You have recurrent headaches or feel dizzy.  You have swelling in your ankles.  You have trouble with your vision. SEEK IMMEDIATE MEDICAL CARE IF:  You develop a severe headache or confusion.  You have unusual weakness, numbness, or feel faint.  You have severe chest or abdominal pain.  You vomit repeatedly.  You have trouble breathing. MAKE SURE YOU:   Understand these instructions.  Will watch your condition.  Will get help right away if you are not doing well or get worse.   This information is not intended to replace advice given to you by your health care provider. Make sure you discuss any questions you have with your health care provider.   Document Released: 06/14/2005 Document Revised: 10/29/2014 Document Reviewed: 04/06/2013 Elsevier Interactive Patient Education 2016 [place] Injury, Adult You have a head injury. Headaches and throwing up (vomiting) are common after a head injury. It  should be easy to wake up from sleeping. Sometimes you must stay in the hospital. Most problems happen within the first 24 hours. Side effects may occur up to 7-10 days after the injury.  WHAT ARE THE TYPES OF HEAD INJURIES? Head injuries can be as minor as a bump. Some head injuries can be more severe. More severe head injuries include:  A jarring injury to the brain (concussion).  A bruise of the brain (contusion). This mean there is bleeding in the brain that can cause swelling.  A cracked skull (skull fracture).  Bleeding in the brain that collects, clots, and forms a bump (hematoma). WHEN SHOULD I GET HELP RIGHT AWAY?   You are confused or sleepy.  You cannot be woken up.  You feel sick to your stomach (nauseous) or keep throwing up (vomiting).  Your dizziness or unsteadiness is getting worse.  You have very bad, lasting headaches that are not helped by medicine. Take medicines only as told by your doctor.  You cannot use your arms or legs like normal.  You cannot walk.  You notice changes in the black spots in the center of the colored part of your eye (pupil).  You have clear or bloody fluid coming from your  nose or ears.  You have trouble seeing. During the next 24 hours after the injury, you must stay with someone who can watch you. This person should get help right away (call 911 in the [place] if you start to shake and are not able to control it (have seizures), you pass out, or you are unable to wake up. HOW CAN I PREVENT A HEAD INJURY IN THE FUTURE?  Wear seat belts.  Wear a helmet while bike riding and playing sports like football.  Stay away from dangerous activities around the house. WHEN CAN I RETURN TO NORMAL ACTIVITIES AND ATHLETICS? See your doctor before doing these activities. You should not do normal activities or play contact sports until 1 week after the following symptoms have stopped:  Headache that does not go away.  Dizziness.  Poor  attention.  Confusion.  Memory problems.  Sickness to your stomach or throwing up.  Tiredness.  Fussiness.  Bothered by bright lights or loud noises.  Anxiousness or depression.  Restless sleep. MAKE SURE YOU:   Understand these instructions.  Will watch your condition.  Will get help right away if you are not doing well or get worse.   This information is not intended to replace advice given to you by your health care provider. Make sure you discuss any questions you have with your health care provider.   Document Released: 05/27/2008 Document Revised: 07/05/2014 Document Reviewed: 02/19/2013 Elsevier Interactive Patient Education [employer].

## 2015-08-29 NOTE — ED Notes (Signed)
Bed: WA12 Expected date:  Expected time:  Means of arrival:  Comments: EMS 

## 2015-08-29 NOTE — ED Notes (Signed)
Delay in Medication administration due to medication being sent from pharmacy

## 2015-08-29 NOTE — ED Notes (Signed)
MD at bedside. 

## 2015-08-29 NOTE — ED Notes (Signed)
[facility] informed that pt will be transported back to facility

## 2015-08-29 NOTE — ED Provider Notes (Signed)
By signing my name below, I, Altamease Oiler, attest that this documentation has been prepared under the direction and in the presence of [place], DO. Electronically Signed: Altamease Oiler, ED Scribe. 08/29/2015. 2:59 AM.  TIME SEEN: 2:47 AM  CHIEF COMPLAINT: Fall  HPI: Brought in by EMS from [place], [name] is a 82 y.o. female with an extensive PMHx including dementia, HTN, HLD, MI, CHF, and a-fib  who presents to the Emergency Department complaining of a fall last night. Pt states that she was sitting on the side of her bed and probably went to sleep before falling. She does not remember if she struck her head in the fall.  Associated symptoms include headache and lower back pain. She typically ambulates with a walker. Over he last few days she has "not been feeling to well" but cannot elaborate further. Denies chest pain, shortness of breath. Denies fevers, cough. Denies vomiting or diarrhea.   ROS: See HPI Constitutional: no fever  Eyes: no drainage  ENT: no runny nose   Cardiovascular:  no chest pain  Resp: no SOB  GI: no vomiting GU: no dysuria Integumentary: no rash  Allergy: no hives  Musculoskeletal: no leg swelling  Neurological: no slurred speech ROS otherwise negative  PAST MEDICAL HISTORY/PAST SURGICAL HISTORY:  Past Medical History  Diagnosis Date  . Coronary artery disease   . Dementia   . Hypertension   . Hyperlipidemia   . Anxiety   . GERD (gastroesophageal reflux disease)   . CHF (congestive heart failure) ([place])   . Polyp, stomach 11/11/2006  . Aphakia of left eye   . Obese   . MI (myocardial infarction) ([place])   . Chest pain at rest 12/20/2011  . CAD (coronary artery disease), cutting balloon athrectomy of her dominant AV groove of LCX.  2007 12/20/2011  . Bradycardia 12/20/2011  . HTN (hypertension) 12/20/2011  . Dyslipidemia  12/20/2011  . Cardiomyopathy ([place])   . History of GI bleed   . Atrial fibrillation ([place])   .  Sigmoid diverticulosis   . Anemia, iron deficiency 10/24/2012  . Constipation due to pain medication 10/24/2012  . Cataract     "coming back in both eyes" (06/19/2013)  . Chronic bronchitis ([place])     "I have it all year round"  . Shortness of breath     "comes on at anytime" (06/19/2013)  . OSA on CPAP 12/20/2011  . Chronic back pain     "all over my back"   . Breast cancer, right breast ([place])   . Type II diabetes mellitus ([place])   . Headache     "right often" (08/09/2014)  . DJD (degenerative joint disease)   . Osteoarthritis   . Arthritis     "all over my body"  . Nephrolithiasis     bilateral    MEDICATIONS:  Prior to Admission medications   Medication Sig Start Date End Date Taking? Authorizing Provider  acetaminophen (TYLENOL) 500 MG tablet Take 1 tablet (500 mg total) by mouth every 6 (six) hours as needed. Patient taking differently: Take 500 mg by mouth every 8 (eight) hours as needed for moderate pain.  04/10/15   Kayla Rose, PA-C  amLODipine (NORVASC) 10 MG tablet Take 10 mg by mouth daily.    Historical Provider, MD  Artificial Tear Ointment (SOOTHE NIGHT TIME) OINT Place 1 application into the right eye 3 (three) times daily.     Historical Provider, MD  aspirin 81 MG chewable  tablet Chew 81 mg by mouth daily.    Historical Provider, MD  bisacodyl (DULCOLAX) 10 MG suppository Place 1 suppository (10 mg total) rectally daily as needed (no bowel movement in 24 hours). 08/12/14   Janece Canterbury, MD  cetirizine (ZYRTEC) 5 MG tablet Take 5 mg by mouth every morning.     Historical Provider, MD  cholecalciferol (VITAMIN D) 400 UNITS TABS tablet Take 800 Units by mouth daily.    Historical Provider, MD  citalopram (CELEXA) 20 MG tablet Take 20 mg by mouth daily before breakfast.     Historical Provider, MD  docusate sodium (COLACE) 100 MG capsule Take 100 mg by mouth 2 (two) times daily.    Historical Provider, MD  esomeprazole (NEXIUM) 40 MG capsule Take 40 mg by mouth daily  before breakfast.    Historical Provider, MD  fluticasone (FLONASE) 50 MCG/ACT nasal spray Place 2 sprays into both nostrils daily.     Historical Provider, MD  hydrALAZINE (APRESOLINE) 25 MG tablet Take 37.5 mg by mouth 3 (three) times daily. Take at 6am , 2pm, 8pm    Historical Provider, MD  hydrocortisone (ANUSOL-HC) 2.5 % rectal cream Place 1 application rectally 2 (two) times daily.    Historical Provider, MD  isosorbide mononitrate (IMDUR) 60 MG 24 hr tablet Take 1 tablet (60 mg total) by mouth daily before breakfast. 11/16/12   Isaiah Serge, NP  ketorolac (ACULAR) 0.5 % ophthalmic solution Place 1 drop into both eyes daily.    Historical Provider, MD  Lidocaine, Anorectal, 5 % CREA Apply to perianal area as needed for pain. 02/04/15   John Molpus, MD  losartan-hydrochlorothiazide (HYZAAR) 100-25 MG per tablet Take 1 tablet by mouth daily before breakfast.     Historical Provider, MD  lubiprostone (AMITIZA) 24 MCG capsule Take 1 capsule (24 mcg total) by mouth 2 (two) times daily with a meal. Patient not taking: Reported on 06/15/2015 10/24/12   Gatha Mayer, MD  nitroGLYCERIN (NITROLINGUAL) 0.4 MG/SPRAY spray Place 2 sprays under the tongue every 5 (five) minutes as needed. Reported on 06/15/2015    Historical Provider, MD  polyethylene glycol (MIRALAX / GLYCOLAX) packet Take 17 g by mouth 3 (three) times daily. Mix in 8 oz of suitable liquid and drink by mouth every day Patient taking differently: Take 17 g by mouth daily as needed. Mix in 8 oz of suitable liquid and drink by mouth every day 08/12/14   Janece Canterbury, MD  pramoxine (PROCTOFOAM) 1 % foam Place 1 application rectally 3 (three) times daily as needed for itching. Patient not taking: Reported on 06/15/2015 12/12/14   Baron Sane, PA-C  rosuvastatin (CRESTOR) 5 MG tablet Take 5 mg by mouth at bedtime.     Historical Provider, MD  spironolactone (ALDACTONE) 25 MG tablet Take 25 mg by mouth daily before breakfast.      Historical Provider, MD  tamsulosin (FLOMAX) 0.4 MG CAPS capsule Take 1 capsule (0.4 mg total) by mouth daily. 08/12/14   Janece Canterbury, MD    ALLERGIES:  Allergies  Allergen Reactions  . Chicken Allergy Other (See Comments)    unknown  . Fish Allergy Other (See Comments)    unknown  . Percocet [Oxycodone-Acetaminophen] Itching    SOCIAL HISTORY:  Social History  Substance Use Topics  . Smoking status: Never Smoker   . Smokeless tobacco: Never Used  . Alcohol Use: No    FAMILY HISTORY: Family History  Problem Relation Age of Onset  . Colon cancer  Mother   . Heart disease Mother   . Stroke Mother     EXAM: BP 207/71 mmHg  Pulse 53  Temp(Src) 97.4 F (36.3 C) (Oral)  Resp 16  SpO2 98% CONSTITUTIONAL: Alert, oriented to person and place but states that it is 2016. Responds appropriately to questions. Elderly and chronically ill-appearing; GCS 15 HEAD: Normocephalic; atraumatic EYES: Conjunctivae clear with no injection, Pt has bilateral glaucoma, opacification of bilateral lenses, and a small amount of yellow drainage noted on bilateral eyelashes ENT: normal nose; no rhinorrhea; moist mucous membranes; pharynx without lesions noted; no dental injury; no septal hematoma NECK: Supple, no meningismus, no LAD; no midline spinal tenderness, step-off or deformity CARD: RRR; S1 and S2 appreciated; no murmurs, no clicks, no rubs, no gallops RESP: Normal chest excursion without splinting or tachypnea; breath sounds clear and equal bilaterally; no wheezes, no rhonchi, no rales; no hypoxia or respiratory distress CHEST:  chest wall stable, no crepitus or ecchymosis or deformity, TTP over the right posterior ribs ABD/GI: Normal bowel sounds; non-distended; soft, non-tender, no rebound, no guarding PELVIS:  stable, nontender to palpation, no leg length discrepancy, no tenderness over either hip BACK:  The back appears normal, tender in the lumbar spine without step off or deformity,  No tenderness in the thoracic spine. There is no CVA tenderness; no midline spinal tenderness, step-off or deformity EXT: Normal ROM in all joints; non-tender to palpation; no edema; normal capillary refill; no cyanosis, no bony tenderness or bony deformity of patient's extremities, no joint effusion, no ecchymosis or lacerations    SKIN: Normal color for age and race; warm NEURO: Oriented to person and place but states that it is 2016, Moves all extremities equally, sensation to light touch intact diffusely, cranial nerves II through XII intact PSYCH: The patient's mood and manner are appropriate. Grooming and personal hygiene are appropriate.  MEDICAL DECISION MAKING: Patient here with fall out of bed. No sinus, on exam. States she thinks she may have hit her head. We'll obtain head and neck CT. We'll also obtain x-rays of her right ribs and lumbar spine. Per nursing notes patient was complaining of right hip pain but she denies is currently has full range of motion in this hip, no leg length discrepancy and no tenderness to palpation. We'll give pain medication. She is hypertensive. She is on multiple blood pressure medications. We'll give her a dose of her oral hydralazine.  ED PROGRESS: Patient's labs are unremarkable. Urine shows no sign of infection. No hematuria or proteinuria. Head CT and neck CT showed no evidence of acute injury. She does have chronic kyphosis along the cervical spine with associated degenerative changes and narrowing of the spinal canal to 6-7 mm at C5-C6 in the AP dimension. She has good movement of her bilateral upper extremity is and reports normal sensation. Suspect this is chronic issue. She is not currently complaining of any neck pain. Have discussed this finding with patient and recommend she follow-up with a neurosurgeon as an outpatient. I do not feel neurosurgery needs to be consult emergently. Discussed with patient that she is likely a poor surgical candidate given  her age and comorbidities.   CT scan also shows a hypodensity in the left thyroid lobe and a right lung apex nodule. Have recommended outpatient follow-up for both of these incidental findings.  Chest x-ray shows no rib fracture. There is mild vascular congestion. No acute trauma injury to the lumbar spine. She does have degenerative changes.  Patient reports feeling better. Blood pressure looks much better. I feel she is safe to be discharged back to her nursing facility. Have printed out her results so that they can be presented to her primary care physician.   Discussed return precautions. Patient verbalizes understanding and is comfortable with this plan.   EKG Interpretation  Date/Time:  Friday August 29 2015 03:12:01 EST Ventricular Rate:  56 PR Interval:  185 QRS Duration: 106 QT Interval:  467 QTC Calculation: 451 R Axis:   4 Text Interpretation:  Sinus rhythm Borderline low voltage, extremity leads No significant change since last tracing Confirmed by Briya Lookabaugh,  DO, Julliana Whitmyer [id number]) on 08/29/2015 3:41:58 AM         I personally performed the services described in this documentation, which was scribed in my presence. The recorded information has been reviewed and is accurate.     [place], DO 08/29/15 [phone]

## 2015-09-16 ENCOUNTER — Emergency Department (HOSPITAL_COMMUNITY): Payer: Medicare Other

## 2015-09-16 ENCOUNTER — Encounter (HOSPITAL_COMMUNITY): Payer: Self-pay

## 2015-09-16 ENCOUNTER — Emergency Department (HOSPITAL_COMMUNITY)
Admission: EM | Admit: 2015-09-16 | Discharge: 2015-09-16 | Disposition: A | Discharge status: Home / Self Care | Payer: Medicare Other | Attending: Emergency Medicine | Admitting: Emergency Medicine

## 2015-09-16 DIAGNOSIS — Y999 Unspecified external cause status: Secondary | ICD-10-CM | POA: Insufficient documentation

## 2015-09-16 DIAGNOSIS — E669 Obesity, unspecified: Secondary | ICD-10-CM | POA: Diagnosis not present

## 2015-09-16 DIAGNOSIS — G8929 Other chronic pain: Secondary | ICD-10-CM | POA: Diagnosis not present

## 2015-09-16 DIAGNOSIS — Z9889 Other specified postprocedural states: Secondary | ICD-10-CM | POA: Diagnosis not present

## 2015-09-16 DIAGNOSIS — Z79899 Other long term (current) drug therapy: Secondary | ICD-10-CM | POA: Insufficient documentation

## 2015-09-16 DIAGNOSIS — Z87442 Personal history of urinary calculi: Secondary | ICD-10-CM | POA: Insufficient documentation

## 2015-09-16 DIAGNOSIS — W19XXXA Unspecified fall, initial encounter: Secondary | ICD-10-CM

## 2015-09-16 DIAGNOSIS — Z862 Personal history of diseases of the blood and blood-forming organs and certain disorders involving the immune mechanism: Secondary | ICD-10-CM | POA: Insufficient documentation

## 2015-09-16 DIAGNOSIS — K5900 Constipation, unspecified: Secondary | ICD-10-CM | POA: Diagnosis not present

## 2015-09-16 DIAGNOSIS — G4733 Obstructive sleep apnea (adult) (pediatric): Secondary | ICD-10-CM | POA: Diagnosis not present

## 2015-09-16 DIAGNOSIS — Y9289 Other specified places as the place of occurrence of the external cause: Secondary | ICD-10-CM | POA: Insufficient documentation

## 2015-09-16 DIAGNOSIS — F419 Anxiety disorder, unspecified: Secondary | ICD-10-CM | POA: Diagnosis not present

## 2015-09-16 DIAGNOSIS — Z8709 Personal history of other diseases of the respiratory system: Secondary | ICD-10-CM | POA: Insufficient documentation

## 2015-09-16 DIAGNOSIS — Y9389 Activity, other specified: Secondary | ICD-10-CM | POA: Insufficient documentation

## 2015-09-16 DIAGNOSIS — Z9861 Coronary angioplasty status: Secondary | ICD-10-CM | POA: Insufficient documentation

## 2015-09-16 DIAGNOSIS — Z853 Personal history of malignant neoplasm of breast: Secondary | ICD-10-CM | POA: Insufficient documentation

## 2015-09-16 DIAGNOSIS — S3992XA Unspecified injury of lower back, initial encounter: Secondary | ICD-10-CM | POA: Insufficient documentation

## 2015-09-16 DIAGNOSIS — Z7982 Long term (current) use of aspirin: Secondary | ICD-10-CM | POA: Insufficient documentation

## 2015-09-16 DIAGNOSIS — E119 Type 2 diabetes mellitus without complications: Secondary | ICD-10-CM | POA: Insufficient documentation

## 2015-09-16 DIAGNOSIS — Z79891 Long term (current) use of opiate analgesic: Secondary | ICD-10-CM | POA: Diagnosis not present

## 2015-09-16 DIAGNOSIS — I252 Old myocardial infarction: Secondary | ICD-10-CM | POA: Insufficient documentation

## 2015-09-16 DIAGNOSIS — Z9981 Dependence on supplemental oxygen: Secondary | ICD-10-CM | POA: Diagnosis not present

## 2015-09-16 DIAGNOSIS — K219 Gastro-esophageal reflux disease without esophagitis: Secondary | ICD-10-CM | POA: Diagnosis not present

## 2015-09-16 DIAGNOSIS — F0390 Unspecified dementia without behavioral disturbance: Secondary | ICD-10-CM | POA: Diagnosis not present

## 2015-09-16 DIAGNOSIS — S0990XA Unspecified injury of head, initial encounter: Secondary | ICD-10-CM | POA: Insufficient documentation

## 2015-09-16 DIAGNOSIS — E785 Hyperlipidemia, unspecified: Secondary | ICD-10-CM | POA: Diagnosis not present

## 2015-09-16 DIAGNOSIS — I2510 Atherosclerotic heart disease of native coronary artery without angina pectoris: Secondary | ICD-10-CM | POA: Insufficient documentation

## 2015-09-16 DIAGNOSIS — I509 Heart failure, unspecified: Secondary | ICD-10-CM | POA: Insufficient documentation

## 2015-09-16 DIAGNOSIS — M1990 Unspecified osteoarthritis, unspecified site: Secondary | ICD-10-CM | POA: Diagnosis not present

## 2015-09-16 DIAGNOSIS — W01198A Fall on same level from slipping, tripping and stumbling with subsequent striking against other object, initial encounter: Secondary | ICD-10-CM | POA: Diagnosis not present

## 2015-09-16 DIAGNOSIS — Z7951 Long term (current) use of inhaled steroids: Secondary | ICD-10-CM | POA: Diagnosis not present

## 2015-09-16 DIAGNOSIS — I10 Essential (primary) hypertension: Secondary | ICD-10-CM | POA: Insufficient documentation

## 2015-09-16 MED ORDER — HYDRALAZINE HCL 25 MG PO TABS
37.5000 mg | ORAL_TABLET | Freq: Once | ORAL | Status: AC
  Administered 2015-09-16: 37.5 mg via ORAL
  Filled 2015-09-16: qty 2

## 2015-09-16 NOTE — ED Notes (Signed)
[facility]- pt coming from [facility] after a fall. She reports a man at the facility tripped her with his cane. No LOC, no blood thinners. Pt alert and oriented.

## 2015-09-16 NOTE — ED Provider Notes (Signed)
CSN: [id number]     Arrival date & time 09/16/15  1217 History   First MD Initiated Contact with Patient 09/16/15 1219     Chief Complaint  Patient presents with  . Fall     (Consider location/radiation/quality/duration/timing/severity/associated sxs/prior Treatment) HPI Patient has history of dementia and is resident in nursing home facility. She is brought in by EMS after ground-level fall. Patient states that a man tripped her with his cane. She's struck her head. She denies loss of consciousness. She complains of posterior headache. Cervical collar was applied by EMS. Patient with history of dementia and is unable to give specifics as to the history. Level V caveat applies. Past Medical History  Diagnosis Date  . Coronary artery disease   . Dementia   . Hypertension   . Hyperlipidemia   . Anxiety   . GERD (gastroesophageal reflux disease)   . CHF (congestive heart failure) ([place])   . Polyp, stomach 11/11/2006  . Aphakia of left eye   . Obese   . MI (myocardial infarction) ([place])   . Chest pain at rest 12/20/2011  . CAD (coronary artery disease), cutting balloon athrectomy of her dominant AV groove of LCX.  2007 12/20/2011  . Bradycardia 12/20/2011  . HTN (hypertension) 12/20/2011  . Dyslipidemia  12/20/2011  . Cardiomyopathy ([place])   . History of GI bleed   . Atrial fibrillation ([place])   . Sigmoid diverticulosis   . Anemia, iron deficiency 10/24/2012  . Constipation due to pain medication 10/24/2012  . Cataract     "coming back in both eyes" (06/19/2013)  . Chronic bronchitis ([place])     "I have it all year round"  . Shortness of breath     "comes on at anytime" (06/19/2013)  . OSA on CPAP 12/20/2011  . Chronic back pain     "all over my back"   . Breast cancer, right breast ([place])   . Type II diabetes mellitus ([place])   . Headache     "right often" (08/09/2014)  . DJD (degenerative joint disease)   . Osteoarthritis   . Arthritis     "all over my body"  . Nephrolithiasis      bilateral   Past Surgical History  Procedure Laterality Date  . Back surgery    . Appendectomy    . Fracture surgery    . Dilation and curettage of uterus    . Esophagogastroduodenoscopy    . Colonoscopy    . Eus    . Esophagogastroduodenoscopy  02/21/2012    Procedure: ESOPHAGOGASTRODUODENOSCOPY (EGD);  Surgeon: Gatha Mayer, MD;  Location: [facility];  Service: Endoscopy;  Laterality: N/A;  pt. being evaluated for a pacemaker  . Balloon dilation  02/21/2012    Procedure: BALLOON DILATION;  Surgeon: Gatha Mayer, MD;  Location: [facility];  Service: Endoscopy;  Laterality: N/A;  . Colonoscopy N/A 10/24/2012    Procedure: COLONOSCOPY;  Surgeon: Gatha Mayer, MD;  Location: [facility];  Service: Endoscopy;  Laterality: N/A;  . US echocardiography  01/25/2008    mild DUST,mild MR,mod. ca+ mitral annular,AOV mildly sclerotid  . Lexiscan mycardial perfusion scan  01/25/2008    Normal  . Inguinal hernia repair Right 06/18/2013  . Tonsillectomy    . Total abdominal hysterectomy    . Cataract extraction w/ intraocular lens  implant, bilateral Bilateral   . Breast biopsy Right   . Breast lumpectomy Right   . Inguinal hernia repair Right 06/18/2013    Procedure: HERNIA  REPAIR INGUINAL ADULT;  Surgeon: Gwenyth Ober, MD;  Location: [place];  Service: General;  Laterality: Right;  . Insertion of mesh Right 06/18/2013    Procedure: INSERTION OF MESH;  Surgeon: Gwenyth Ober, MD;  Location: [place];  Service: General;  Laterality: Right;  . Left heart catheterization with coronary angiogram N/A 12/21/2011    Procedure: LEFT HEART CATHETERIZATION WITH CORONARY ANGIOGRAM;  Surgeon: Lorretta Harp, MD;  Location: [facility] CATH LAB;  Service: Cardiovascular;  Laterality: N/A;  . Glaucoma surgery Bilateral   . Eye surgery    . Coronary angioplasty with stent placement      "Dr. Alvester Chou put it in; it's been in a long time"  . Coronary angioplasty  03-07-2004    cutting balloon  . Cardiac  catheterization  06/05/2004    high grade disease AV groove CX  . Cardiac catheterization  04/19/2005    noncritical CAD   Family History  Problem Relation Age of Onset  . Colon cancer Mother   . Heart disease Mother   . Stroke Mother    Social History  Substance Use Topics  . Smoking status: Never Smoker   . Smokeless tobacco: Never Used  . Alcohol Use: No   OB History    No data available     Review of Systems  Unable to perform ROS: Dementia      Allergies  Chicken allergy; Fish allergy; and Percocet  Home Medications   Prior to Admission medications   Medication Sig Start Date End Date Taking? Authorizing Provider  acetaminophen (TYLENOL) 500 MG tablet Take 1 tablet (500 mg total) by mouth every 6 (six) hours as needed. Patient taking differently: Take 500 mg by mouth 3 (three) times daily.  04/10/15  Yes Kayla Rose, PA-C  amLODipine (NORVASC) 10 MG tablet Take 10 mg by mouth daily.   Yes Historical Provider, MD  Artificial Tear Ointment (SOOTHE NIGHT TIME) OINT Place 1 application into the right eye 3 (three) times daily.    Yes Historical Provider, MD  aspirin 81 MG chewable tablet Chew 81 mg by mouth daily.   Yes Historical Provider, MD  cetirizine (ZYRTEC) 5 MG tablet Take 5 mg by mouth every morning.    Yes Historical Provider, MD  cholecalciferol (VITAMIN D) 400 UNITS TABS tablet Take 800 Units by mouth daily.   Yes Historical Provider, MD  citalopram (CELEXA) 20 MG tablet Take 20 mg by mouth daily before breakfast.    Yes Historical Provider, MD  docusate sodium (COLACE) 100 MG capsule Take 100 mg by mouth 2 (two) times daily.   Yes Historical Provider, MD  esomeprazole (NEXIUM) 40 MG capsule Take 40 mg by mouth daily before breakfast.   Yes Historical Provider, MD  fluticasone (FLONASE) 50 MCG/ACT nasal spray Place 2 sprays into both nostrils daily.    Yes Historical Provider, MD  hydrALAZINE (APRESOLINE) 25 MG tablet Take 37.5 mg by mouth 3 (three) times  daily. Take at 6am , 2pm, 8pm   Yes Historical Provider, MD  isosorbide mononitrate (IMDUR) 60 MG 24 hr tablet Take 1 tablet (60 mg total) by mouth daily before breakfast. 11/16/12  Yes Isaiah Serge, NP  ketorolac (ACULAR) 0.5 % ophthalmic solution Place 1 drop into both eyes daily.   Yes Historical Provider, MD  losartan-hydrochlorothiazide (HYZAAR) 100-25 MG per tablet Take 1 tablet by mouth daily before breakfast.    Yes Historical Provider, MD  polyethylene glycol (MIRALAX / GLYCOLAX) packet Take 17 g  by mouth 3 (three) times daily. Mix in 8 oz of suitable liquid and drink by mouth every day Patient taking differently: Take 17 g by mouth daily as needed. Mix in 8 oz of suitable liquid and drink by mouth every day 08/12/14  Yes Janece Canterbury, MD  pramoxine (PROCTOFOAM) 1 % foam Place 1 application rectally 3 (three) times daily as needed for itching. 12/12/14  Yes Jennifer Piepenbrink, PA-C  pravastatin (PRAVACHOL) 40 MG tablet Take 40 mg by mouth every evening.   Yes Historical Provider, MD  spironolactone (ALDACTONE) 25 MG tablet Take 25 mg by mouth daily before breakfast.    Yes Historical Provider, MD  tamsulosin (FLOMAX) 0.4 MG CAPS capsule Take 1 capsule (0.4 mg total) by mouth daily. 08/12/14  Yes Janece Canterbury, MD  traMADol (ULTRAM) 50 MG tablet Take 50 mg by mouth 2 (two) times daily.   Yes Historical Provider, MD  Lidocaine, Anorectal, 5 % CREA Apply to perianal area as needed for pain. Patient not taking: Reported on 09/16/2015 02/04/15   Shanon Rosser, MD   BP 191/60 mmHg  Pulse 62  Temp(Src) 98.2 F (36.8 C) (Oral)  Resp 18  SpO2 100% Physical Exam  Constitutional: She appears well-developed and well-nourished. No distress.  HENT:  Head: Normocephalic and atraumatic.  Mouth/Throat: Oropharynx is clear and moist.  Eyes: EOM are normal. Pupils are equal, round, and reactive to light.  Neck:  Cervical collar in place.  Cardiovascular: Normal rate and regular rhythm.  Exam  reveals no gallop and no friction rub.   No murmur heard. Pulmonary/Chest: Effort normal and breath sounds normal. No respiratory distress. She has no wheezes. She has no rales. She exhibits no tenderness.  Abdominal: Soft. Bowel sounds are normal. She exhibits no distension and no mass. There is no tenderness. There is no rebound and no guarding.  Musculoskeletal: Normal range of motion. She exhibits tenderness. She exhibits no edema.  Mild tenderness to palpation over the right paraspinal lumbar region just superior to the iliac crest. Pulses are equal and intact. No lower extremity asymmetry, swelling or tenderness.  Neurological: She is alert.  Oriented to person. 5/5 motor in all extremities. Sensation intact.  Skin: Skin is warm and dry. No rash noted. No erythema.  Psychiatric: She has a normal mood and affect. Her behavior is normal.  Nursing note and vitals reviewed.   ED Course  Procedures (including critical care time) Labs Review Labs Reviewed - No data to display  Imaging Review Ct Head Wo Contrast  09/16/2015  CLINICAL DATA:  Fall today. No loss of consciousness. History of hypertension, diabetes and breast cancer. EXAM: CT HEAD WITHOUT CONTRAST CT CERVICAL SPINE WITHOUT CONTRAST TECHNIQUE: Multidetector CT imaging of the head and cervical spine was performed following the standard protocol without intravenous contrast. Multiplanar CT image reconstructions of the cervical spine were also generated. COMPARISON:  Prior examinations 08/29/2015. FINDINGS: CT HEAD FINDINGS Brain: There is no evidence of acute intracranial hemorrhage, mass lesion, brain edema or extra-axial fluid collection. The ventricles and subarachnoid spaces are prominent but stable. There is no CT evidence of acute cortical infarction. Periventricular and subcortical white matter disease appears unchanged. There are diffuse intracranial vascular calcifications. Bones/sinuses/visualized face: The visualized paranasal  sinuses, mastoid air cells and middle ears are clear. The calvarium is intact. CT CERVICAL SPINE FINDINGS The initial series was motion degraded. The examination was repeated. Examination is mildly limited by the patient's cervical kyphosis. The overall alignment is stable with a gradual kyphosis.  No evidence of acute fracture or traumatic subluxation. There is multilevel spondylosis with endplate osteophytes and sclerosis. No acute soft tissue findings are demonstrated. There is diffuse carotid atherosclerosis. Left thyroid nodularity appears unchanged from the recent study. This also similar to an older study 09/13/2014. IMPRESSION: 1. No acute intracranial or calvarial findings. 2. Stable atrophy and chronic periventricular/subcortical white matter disease, likely chronic small vessel ischemic changes. 3. No evidence of acute cervical spine fracture, traumatic subluxation or static signs of instability. Stable cervical kyphosis and multilevel spondylosis. Electronically Signed   By: Richardean Sale M.D.   On: 09/16/2015 13:45   Ct Cervical Spine Wo Contrast  09/16/2015  CLINICAL DATA:  Fall today. No loss of consciousness. History of hypertension, diabetes and breast cancer. EXAM: CT HEAD WITHOUT CONTRAST CT CERVICAL SPINE WITHOUT CONTRAST TECHNIQUE: Multidetector CT imaging of the head and cervical spine was performed following the standard protocol without intravenous contrast. Multiplanar CT image reconstructions of the cervical spine were also generated. COMPARISON:  Prior examinations 08/29/2015. FINDINGS: CT HEAD FINDINGS Brain: There is no evidence of acute intracranial hemorrhage, mass lesion, brain edema or extra-axial fluid collection. The ventricles and subarachnoid spaces are prominent but stable. There is no CT evidence of acute cortical infarction. Periventricular and subcortical white matter disease appears unchanged. There are diffuse intracranial vascular calcifications.  Bones/sinuses/visualized face: The visualized paranasal sinuses, mastoid air cells and middle ears are clear. The calvarium is intact. CT CERVICAL SPINE FINDINGS The initial series was motion degraded. The examination was repeated. Examination is mildly limited by the patient's cervical kyphosis. The overall alignment is stable with a gradual kyphosis. No evidence of acute fracture or traumatic subluxation. There is multilevel spondylosis with endplate osteophytes and sclerosis. No acute soft tissue findings are demonstrated. There is diffuse carotid atherosclerosis. Left thyroid nodularity appears unchanged from the recent study. This also similar to an older study 09/13/2014. IMPRESSION: 1. No acute intracranial or calvarial findings. 2. Stable atrophy and chronic periventricular/subcortical white matter disease, likely chronic small vessel ischemic changes. 3. No evidence of acute cervical spine fracture, traumatic subluxation or static signs of instability. Stable cervical kyphosis and multilevel spondylosis. Electronically Signed   By: Richardean Sale M.D.   On: 09/16/2015 13:45   I have personally reviewed and evaluated these images and lab results as part of my medical decision-making.   EKG Interpretation None      MDM   Final diagnoses:  Fall from standing, initial encounter    No acute findings on CT head and cervical spine. Patient appears to be at her mental status. Will discharge back to nursing home with head injury precautions and shortness of follow-up with her primary physician. Return precautions given.    Julianne Rice, MD 09/16/15 1447

## 2015-09-16 NOTE — Discharge Instructions (Signed)

## 2015-10-20 ENCOUNTER — Emergency Department (HOSPITAL_COMMUNITY)
Admission: EM | Admit: 2015-10-20 | Discharge: 2015-10-20 | Disposition: A | Discharge status: Home / Self Care | Payer: Medicare Other | Attending: Emergency Medicine | Admitting: Emergency Medicine

## 2015-10-20 ENCOUNTER — Emergency Department (HOSPITAL_COMMUNITY): Payer: Medicare Other

## 2015-10-20 ENCOUNTER — Encounter (HOSPITAL_COMMUNITY): Payer: Self-pay | Admitting: *Deleted

## 2015-10-20 DIAGNOSIS — Z9842 Cataract extraction status, left eye: Secondary | ICD-10-CM | POA: Diagnosis not present

## 2015-10-20 DIAGNOSIS — I509 Heart failure, unspecified: Secondary | ICD-10-CM | POA: Insufficient documentation

## 2015-10-20 DIAGNOSIS — Z9181 History of falling: Secondary | ICD-10-CM

## 2015-10-20 DIAGNOSIS — Y9389 Activity, other specified: Secondary | ICD-10-CM | POA: Insufficient documentation

## 2015-10-20 DIAGNOSIS — I10 Essential (primary) hypertension: Secondary | ICD-10-CM | POA: Insufficient documentation

## 2015-10-20 DIAGNOSIS — Z87442 Personal history of urinary calculi: Secondary | ICD-10-CM | POA: Diagnosis not present

## 2015-10-20 DIAGNOSIS — Z7951 Long term (current) use of inhaled steroids: Secondary | ICD-10-CM | POA: Diagnosis not present

## 2015-10-20 DIAGNOSIS — E785 Hyperlipidemia, unspecified: Secondary | ICD-10-CM | POA: Insufficient documentation

## 2015-10-20 DIAGNOSIS — F0390 Unspecified dementia without behavioral disturbance: Secondary | ICD-10-CM | POA: Insufficient documentation

## 2015-10-20 DIAGNOSIS — S0591XA Unspecified injury of right eye and orbit, initial encounter: Secondary | ICD-10-CM | POA: Diagnosis not present

## 2015-10-20 DIAGNOSIS — M1990 Unspecified osteoarthritis, unspecified site: Secondary | ICD-10-CM | POA: Diagnosis not present

## 2015-10-20 DIAGNOSIS — E0789 Other specified disorders of thyroid: Secondary | ICD-10-CM | POA: Insufficient documentation

## 2015-10-20 DIAGNOSIS — Z853 Personal history of malignant neoplasm of breast: Secondary | ICD-10-CM | POA: Diagnosis not present

## 2015-10-20 DIAGNOSIS — Z7982 Long term (current) use of aspirin: Secondary | ICD-10-CM | POA: Diagnosis not present

## 2015-10-20 DIAGNOSIS — Y998 Other external cause status: Secondary | ICD-10-CM | POA: Insufficient documentation

## 2015-10-20 DIAGNOSIS — R221 Localized swelling, mass and lump, neck: Secondary | ICD-10-CM

## 2015-10-20 DIAGNOSIS — I4891 Unspecified atrial fibrillation: Secondary | ICD-10-CM | POA: Diagnosis not present

## 2015-10-20 DIAGNOSIS — Z9889 Other specified postprocedural states: Secondary | ICD-10-CM | POA: Diagnosis not present

## 2015-10-20 DIAGNOSIS — E119 Type 2 diabetes mellitus without complications: Secondary | ICD-10-CM | POA: Insufficient documentation

## 2015-10-20 DIAGNOSIS — F419 Anxiety disorder, unspecified: Secondary | ICD-10-CM | POA: Insufficient documentation

## 2015-10-20 DIAGNOSIS — E669 Obesity, unspecified: Secondary | ICD-10-CM | POA: Diagnosis not present

## 2015-10-20 DIAGNOSIS — Z79899 Other long term (current) drug therapy: Secondary | ICD-10-CM | POA: Insufficient documentation

## 2015-10-20 DIAGNOSIS — Y9289 Other specified places as the place of occurrence of the external cause: Secondary | ICD-10-CM | POA: Diagnosis not present

## 2015-10-20 DIAGNOSIS — W1800XA Striking against unspecified object with subsequent fall, initial encounter: Secondary | ICD-10-CM

## 2015-10-20 DIAGNOSIS — Z9861 Coronary angioplasty status: Secondary | ICD-10-CM | POA: Insufficient documentation

## 2015-10-20 DIAGNOSIS — W1839XA Other fall on same level, initial encounter: Secondary | ICD-10-CM | POA: Diagnosis not present

## 2015-10-20 DIAGNOSIS — S0990XA Unspecified injury of head, initial encounter: Secondary | ICD-10-CM | POA: Diagnosis present

## 2015-10-20 DIAGNOSIS — G4733 Obstructive sleep apnea (adult) (pediatric): Secondary | ICD-10-CM | POA: Diagnosis not present

## 2015-10-20 DIAGNOSIS — Z9841 Cataract extraction status, right eye: Secondary | ICD-10-CM | POA: Insufficient documentation

## 2015-10-20 DIAGNOSIS — I252 Old myocardial infarction: Secondary | ICD-10-CM | POA: Insufficient documentation

## 2015-10-20 DIAGNOSIS — K219 Gastro-esophageal reflux disease without esophagitis: Secondary | ICD-10-CM | POA: Insufficient documentation

## 2015-10-20 DIAGNOSIS — I2510 Atherosclerotic heart disease of native coronary artery without angina pectoris: Secondary | ICD-10-CM | POA: Diagnosis not present

## 2015-10-20 DIAGNOSIS — G44319 Acute post-traumatic headache, not intractable: Secondary | ICD-10-CM

## 2015-10-20 MED ORDER — ACETAMINOPHEN 325 MG PO TABS: 650.0000 mg | ORAL_TABLET | Freq: Once | ORAL | Status: DC

## 2015-10-20 MED ORDER — ACETAMINOPHEN 500 MG PO TABS
500.0000 mg | ORAL_TABLET | Freq: Four times a day (QID) | ORAL | Status: DC | PRN
Start: 2015-10-20 — End: 2016-02-09

## 2015-10-20 NOTE — ED Notes (Signed)
Report given To Joellen Jersey, RN.  Pt. Transferred to Pod C 29 to wait for [facility]-Tar Transfer

## 2015-10-20 NOTE — Discharge Instructions (Signed)
-   Fortunately, your CT imaging showed no acute traumatic abnormality. It is still important to follow up with your doctor for repeat exam and check up. - Your CT scans did, however, show a left thyroid mass. This needs to be followed up with an ultrasound as an outpatient. Discuss this with your primary care provider as soon as possible.

## 2015-10-20 NOTE — ED Notes (Signed)
Called report to [place] at [facility].  Pt. To be transferred back to SNF.  Pt. Is stable.  No injuries noted from fall.

## 2015-10-20 NOTE — ED Notes (Signed)
Pt in from [facility] via [facility], per report pt had witnessed fall from standing position, pt reported to have hit her head on the wall & slid down to the ground, -LOC, moves extremities, pt does not take blood thinners, pt ambulates with walker, pt c/o HA, pt A&O x4, pt denies neck & back pain

## 2015-10-20 NOTE — ED Provider Notes (Signed)
CSN: [id number]     Arrival date & time 10/20/15  1436 History   None    Chief Complaint  Patient presents with  . Fall     (Consider location/radiation/quality/duration/timing/severity/associated sxs/prior Treatment) HPI   82 year old female with extensive past medical history including dementia, hypertension, hyperlipidemia, CHF, coronary artery disease who presents with witnessed fall from standing. Per report from the nursing home, the patient fell backwards from a standing position and hit her head on the wall. The patient then slid down to the ground. There is no loss of consciousness. The patient does not take blood thinners. The patient normally ambulates with a walker but has a history of recurrent falls. Currently, the patient states she is unable to remember what happened to her but she does complain of 10 out of 10, generalized headache. Denies any other complaints at this time. She has mild right facial pain as well surrounding her eye. She is chronically blind so denies any changes in her vision.  Past Medical History  Diagnosis Date  . Coronary artery disease   . Dementia   . Hypertension   . Hyperlipidemia   . Anxiety   . GERD (gastroesophageal reflux disease)   . CHF (congestive heart failure) ([place])   . Polyp, stomach 11/11/2006  . Aphakia of left eye   . Obese   . MI (myocardial infarction) ([place])   . Chest pain at rest 12/20/2011  . CAD (coronary artery disease), cutting balloon athrectomy of her dominant AV groove of LCX.  2007 12/20/2011  . Bradycardia 12/20/2011  . HTN (hypertension) 12/20/2011  . Dyslipidemia  12/20/2011  . Cardiomyopathy ([place])   . History of GI bleed   . Atrial fibrillation ([place])   . Sigmoid diverticulosis   . Anemia, iron deficiency 10/24/2012  . Constipation due to pain medication 10/24/2012  . Cataract     "coming back in both eyes" (06/19/2013)  . Chronic bronchitis ([place])     "I have it all year round"  . Shortness of breath     "comes on  at anytime" (06/19/2013)  . OSA on CPAP 12/20/2011  . Chronic back pain     "all over my back"   . Breast cancer, right breast ([place])   . Type II diabetes mellitus ([place])   . Headache     "right often" (08/09/2014)  . DJD (degenerative joint disease)   . Osteoarthritis   . Arthritis     "all over my body"  . Nephrolithiasis     bilateral   Past Surgical History  Procedure Laterality Date  . Back surgery    . Appendectomy    . Fracture surgery    . Dilation and curettage of uterus    . Esophagogastroduodenoscopy    . Colonoscopy    . Eus    . Esophagogastroduodenoscopy  02/21/2012    Procedure: ESOPHAGOGASTRODUODENOSCOPY (EGD);  Surgeon: Gatha Mayer, MD;  Location: [facility];  Service: Endoscopy;  Laterality: N/A;  pt. being evaluated for a pacemaker  . Balloon dilation  02/21/2012    Procedure: BALLOON DILATION;  Surgeon: Gatha Mayer, MD;  Location: [facility];  Service: Endoscopy;  Laterality: N/A;  . Colonoscopy N/A 10/24/2012    Procedure: COLONOSCOPY;  Surgeon: Gatha Mayer, MD;  Location: [facility];  Service: Endoscopy;  Laterality: N/A;  . US echocardiography  01/25/2008    mild DUST,mild MR,mod. ca+ mitral annular,AOV mildly sclerotid  . Lexiscan mycardial perfusion scan  01/25/2008  Normal  . Inguinal hernia repair Right 06/18/2013  . Tonsillectomy    . Total abdominal hysterectomy    . Cataract extraction w/ intraocular lens  implant, bilateral Bilateral   . Breast biopsy Right   . Breast lumpectomy Right   . Inguinal hernia repair Right 06/18/2013    Procedure: HERNIA REPAIR INGUINAL ADULT;  Surgeon: Gwenyth Ober, MD;  Location: [facility];  Service: General;  Laterality: Right;  . Insertion of mesh Right 06/18/2013    Procedure: INSERTION OF MESH;  Surgeon: Gwenyth Ober, MD;  Location: [facility];  Service: General;  Laterality: Right;  . Left heart catheterization with coronary angiogram N/A 12/21/2011    Procedure: LEFT HEART CATHETERIZATION WITH CORONARY  ANGIOGRAM;  Surgeon: Lorretta Harp, MD;  Location: [facility] CATH LAB;  Service: Cardiovascular;  Laterality: N/A;  . Glaucoma surgery Bilateral   . Eye surgery    . Coronary angioplasty with stent placement      "Dr. Alvester Chou put it in; it's been in a long time"  . Coronary angioplasty  03-07-2004    cutting balloon  . Cardiac catheterization  06/05/2004    high grade disease AV groove CX  . Cardiac catheterization  04/19/2005    noncritical CAD   Family History  Problem Relation Age of Onset  . Colon cancer Mother   . Heart disease Mother   . Stroke Mother    Social History  Substance Use Topics  . Smoking status: Never Smoker   . Smokeless tobacco: Never Used  . Alcohol Use: No   OB History    No data available     Review of Systems  Constitutional: Negative for fever, chills and fatigue.  HENT: Positive for facial swelling. Negative for congestion and rhinorrhea.   Eyes: Negative for visual disturbance.  Respiratory: Negative for cough, shortness of breath and wheezing.   Cardiovascular: Negative for chest pain.  Gastrointestinal: Negative for nausea, vomiting, abdominal pain and diarrhea.  Genitourinary: Negative for dysuria and flank pain.  Musculoskeletal: Negative for neck pain and neck stiffness.  Skin: Negative for pallor.  Neurological: Positive for headaches. Negative for syncope.      Allergies  Chicken allergy; Fish allergy; and Percocet  Home Medications   Prior to Admission medications   Medication Sig Start Date End Date Taking? Authorizing Provider  acetaminophen (TYLENOL) 500 MG tablet Take 1 tablet (500 mg total) by mouth every 6 (six) hours as needed. Patient taking differently: Take 500 mg by mouth 3 (three) times daily.  04/10/15  Yes Kayla Rose, PA-C  amLODipine (NORVASC) 10 MG tablet Take 10 mg by mouth daily.   Yes Historical Provider, MD  Artificial Tear Ointment (SOOTHE NIGHT TIME) OINT Place 1 application into the right eye 3 (three) times  daily.    Yes Historical Provider, MD  aspirin 81 MG chewable tablet Chew 81 mg by mouth daily.   Yes Historical Provider, MD  cetirizine (ZYRTEC) 5 MG tablet Take 5 mg by mouth every morning.    Yes Historical Provider, MD  cholecalciferol (VITAMIN D) 400 UNITS TABS tablet Take 800 Units by mouth daily.   Yes Historical Provider, MD  citalopram (CELEXA) 20 MG tablet Take 20 mg by mouth daily before breakfast.    Yes Historical Provider, MD  docusate sodium (COLACE) 100 MG capsule Take 100 mg by mouth 2 (two) times daily.   Yes Historical Provider, MD  esomeprazole (NEXIUM) 40 MG capsule Take 40 mg by mouth daily before breakfast.  Yes Historical Provider, MD  fluticasone (FLONASE) 50 MCG/ACT nasal spray Place 2 sprays into both nostrils daily.    Yes Historical Provider, MD  hydrALAZINE (APRESOLINE) 25 MG tablet Take 37.5 mg by mouth 3 (three) times daily. Take at 6am , 2pm, 8pm   Yes Historical Provider, MD  isosorbide mononitrate (IMDUR) 60 MG 24 hr tablet Take 1 tablet (60 mg total) by mouth daily before breakfast. 11/16/12  Yes Isaiah Serge, NP  ketorolac (ACULAR) 0.5 % ophthalmic solution Place 1 drop into both eyes daily.   Yes Historical Provider, MD  losartan-hydrochlorothiazide (HYZAAR) 100-25 MG per tablet Take 1 tablet by mouth daily before breakfast.    Yes Historical Provider, MD  pravastatin (PRAVACHOL) 40 MG tablet Take 40 mg by mouth every evening.   Yes Historical Provider, MD  spironolactone (ALDACTONE) 25 MG tablet Take 25 mg by mouth daily before breakfast.    Yes Historical Provider, MD  tamsulosin (FLOMAX) 0.4 MG CAPS capsule Take 1 capsule (0.4 mg total) by mouth daily. 08/12/14  Yes Janece Canterbury, MD  traMADol (ULTRAM) 50 MG tablet Take 50 mg by mouth 2 (two) times daily.   Yes Historical Provider, MD   BP 159/56 mmHg  Pulse 62  Temp(Src) 98.5 F (36.9 C) (Oral)  Resp 14  SpO2 99% Physical Exam  Constitutional: She is oriented to person, place, and time. She  appears well-developed and well-nourished. No distress.  HENT:  Head: Normocephalic.  Mouth/Throat: No oropharyngeal exudate.  Mild right periorbital edema with mild tenderness to palpation. No proptosis. Globes are soft bilaterally. Extraocular movements are intact.  Eyes: Conjunctivae are normal. Pupils are equal, round, and reactive to light.  Neck: Normal range of motion. Neck supple.  Cardiovascular: Normal rate, regular rhythm, normal heart sounds and intact distal pulses.  Exam reveals no friction rub.   No murmur heard. Pulmonary/Chest: Effort normal and breath sounds normal. No respiratory distress. She has no wheezes. She has no rales.  Abdominal: Soft. She exhibits no distension. There is no tenderness.  Musculoskeletal: She exhibits no edema.  Neurological: She is alert and oriented to person, place, and time.  Oriented to person only, which is baseline. Moves all extremities with 5 out of 5 strength. Extraocular movements are intact and face is symmetric. Endorses normal sensation to light touch in bilateral upper and lower extremities.  Skin: Skin is warm. No rash noted.  Nursing note and vitals reviewed.   ED Course  Procedures (including critical care time) Labs Review Labs Reviewed - No data to display  Imaging Review Dg Chest [facility] 1 View  10/20/2015  CLINICAL DATA:  Witnessed fall from standing position. EXAM: PORTABLE CHEST 1 VIEW COMPARISON:  08/29/2015 FINDINGS: Cardiomegaly. Minimal bibasilar atelectasis. No other confluent airspace opacities or effusions. No acute bony abnormality. Degenerative changes in the shoulders. IMPRESSION: Bibasilar atelectasis.  Mild cardiomegaly. Electronically Signed   By: Rolm Baptise M.D.   On: 10/20/2015 15:09   I have personally reviewed and evaluated these images and lab results as part of my medical decision-making.   EKG Interpretation   Date/Time:  Monday October 20 2015 16:40:20 EDT Ventricular Rate:  60 PR Interval:   183 QRS Duration: 106 QT Interval:  447 QTC Calculation: 447 R Axis:   14 Text Interpretation:  Sinus rhythm No significant change since last  tracing Confirmed by YAO  MD, DAVID ([id number]) on 10/20/2015 4:42:06 PM      MDM   82 yo F with PMHx as above  who p/w headache after mechanical fall from standing. No LOC prior to, during, or after event. Pt has h/o recurrent falls due to blindness, dementia, poor mobility and per nursing facility, pt "slipped" backwards with no apparent syncope or alternative etiology for her fall. On arrival, VSS and WNL. Pt has no focal neurological deficits. Not on blood thinners. Will scan head, face, neck. Pt has no other areas of TTP. Denies any other complaints. EKG shows no acute abnormalities or arrhythmia.  CT Head, Face, and C-Spine are negative for acute fracture or abnormality. Pt remains at neurological baseline. HA improved with tylenol. Will d/c with tylenol, return precautions.  Clinical Impression: 1. Acute post-traumatic headache, not intractable   2. Fall against object   3. Mass of thyroid region   4. Risk for falls     Disposition: Discharge  Condition: Good  I have discussed the results, Dx and Tx plan with the pt(& family if present). He/she/they expressed understanding and agree(s) with the plan. Discharge instructions discussed at great length. Strict return precautions discussed and pt &/or family have verbalized understanding of the instructions. No further questions at time of discharge.    Discharge Medication List as of 10/20/2015  5:56 PM      Follow Up: Reymundo Poll, MD [address] [place] [address] [phone]   Follow-up in 2-3 days for repeat neurological exam and ER follow-up. Also, discuss the thyroid mass found on your CT scan at this time.   Pt seen in conjunction with Dr. Geraldo Pitter, MD 10/21/15 Yettem Yao, MD 10/23/15 [phone]

## 2015-10-28 ENCOUNTER — Emergency Department (HOSPITAL_COMMUNITY): Payer: Medicare Other

## 2015-10-28 ENCOUNTER — Encounter (HOSPITAL_COMMUNITY): Payer: Self-pay | Admitting: Emergency Medicine

## 2015-10-28 ENCOUNTER — Emergency Department (HOSPITAL_COMMUNITY)
Admission: EM | Admit: 2015-10-28 | Discharge: 2015-10-28 | Disposition: A | Discharge status: Home / Self Care | Payer: Medicare Other | Attending: Emergency Medicine | Admitting: Emergency Medicine

## 2015-10-28 DIAGNOSIS — S161XXA Strain of muscle, fascia and tendon at neck level, initial encounter: Secondary | ICD-10-CM

## 2015-10-28 DIAGNOSIS — E785 Hyperlipidemia, unspecified: Secondary | ICD-10-CM | POA: Insufficient documentation

## 2015-10-28 DIAGNOSIS — F0390 Unspecified dementia without behavioral disturbance: Secondary | ICD-10-CM | POA: Diagnosis not present

## 2015-10-28 DIAGNOSIS — M545 Low back pain: Secondary | ICD-10-CM | POA: Insufficient documentation

## 2015-10-28 DIAGNOSIS — Z79891 Long term (current) use of opiate analgesic: Secondary | ICD-10-CM | POA: Diagnosis not present

## 2015-10-28 DIAGNOSIS — R51 Headache: Secondary | ICD-10-CM | POA: Insufficient documentation

## 2015-10-28 DIAGNOSIS — Z853 Personal history of malignant neoplasm of breast: Secondary | ICD-10-CM | POA: Insufficient documentation

## 2015-10-28 DIAGNOSIS — E669 Obesity, unspecified: Secondary | ICD-10-CM | POA: Diagnosis not present

## 2015-10-28 DIAGNOSIS — E119 Type 2 diabetes mellitus without complications: Secondary | ICD-10-CM | POA: Diagnosis not present

## 2015-10-28 DIAGNOSIS — Y9301 Activity, walking, marching and hiking: Secondary | ICD-10-CM | POA: Insufficient documentation

## 2015-10-28 DIAGNOSIS — I2510 Atherosclerotic heart disease of native coronary artery without angina pectoris: Secondary | ICD-10-CM | POA: Insufficient documentation

## 2015-10-28 DIAGNOSIS — Z79899 Other long term (current) drug therapy: Secondary | ICD-10-CM | POA: Diagnosis not present

## 2015-10-28 DIAGNOSIS — S39012A Strain of muscle, fascia and tendon of lower back, initial encounter: Secondary | ICD-10-CM

## 2015-10-28 DIAGNOSIS — S0003XA Contusion of scalp, initial encounter: Secondary | ICD-10-CM | POA: Diagnosis not present

## 2015-10-28 DIAGNOSIS — Z6820 Body mass index (BMI) 20.0-20.9, adult: Secondary | ICD-10-CM | POA: Diagnosis not present

## 2015-10-28 DIAGNOSIS — I252 Old myocardial infarction: Secondary | ICD-10-CM | POA: Diagnosis not present

## 2015-10-28 DIAGNOSIS — Y9289 Other specified places as the place of occurrence of the external cause: Secondary | ICD-10-CM | POA: Insufficient documentation

## 2015-10-28 DIAGNOSIS — Z7982 Long term (current) use of aspirin: Secondary | ICD-10-CM | POA: Insufficient documentation

## 2015-10-28 DIAGNOSIS — M1990 Unspecified osteoarthritis, unspecified site: Secondary | ICD-10-CM | POA: Diagnosis not present

## 2015-10-28 DIAGNOSIS — Y999 Unspecified external cause status: Secondary | ICD-10-CM | POA: Insufficient documentation

## 2015-10-28 DIAGNOSIS — I509 Heart failure, unspecified: Secondary | ICD-10-CM | POA: Diagnosis not present

## 2015-10-28 DIAGNOSIS — I110 Hypertensive heart disease with heart failure: Secondary | ICD-10-CM | POA: Diagnosis not present

## 2015-10-28 DIAGNOSIS — W010XXA Fall on same level from slipping, tripping and stumbling without subsequent striking against object, initial encounter: Secondary | ICD-10-CM | POA: Diagnosis not present

## 2015-10-28 DIAGNOSIS — M542 Cervicalgia: Secondary | ICD-10-CM | POA: Diagnosis present

## 2015-10-28 MED ORDER — ACETAMINOPHEN 325 MG PO TABS
650.0000 mg | ORAL_TABLET | Freq: Once | ORAL | Status: AC
  Administered 2015-10-28: 650 mg via ORAL
  Filled 2015-10-28: qty 2

## 2015-10-28 NOTE — Discharge Instructions (Signed)
It was our pleasure to provide your ER care today - we hope that you feel better.  Fall precautions - ambulate with assistance.  Your xrays/scans were read as showing: IMPRESSION: 1. No acute intracranial abnormality. Stable atrophy and chronic white matter disease. 2. No cervical spine acute fracture or subluxation. Multilevel degenerative changes as described above most significant at C5-C6 level. 3. Again noted low-density lesion left lobe of thyroid gland measures 2.2 cm. Further correlation with thyroid gland ultrasound is again recommended.  For the incidental findings noted above, follow up with your doctor.  Follow up with your doctor in the next 1-2 weeks.  Return to ER if worse, new symptoms, fevers, trouble breathing, new or severe pain, medical emergency, other concern.     Fall Prevention in the Home  Falls can cause injuries and can affect people from all age groups. There are many simple things that you can do to make your home safe and to help prevent falls. WHAT CAN I DO ON THE OUTSIDE OF MY HOME?  Regularly repair the edges of walkways and driveways and fix any cracks.  Remove high doorway thresholds.  Trim any shrubbery on the main path into your home.  Use bright outdoor lighting.  Clear walkways of debris and clutter, including tools and rocks.  Regularly check that handrails are securely fastened and in good repair. Both sides of any steps should have handrails.  Install guardrails along the edges of any raised decks or porches.  Have leaves, snow, and ice cleared regularly.  Use sand or salt on walkways during winter months.  In the garage, clean up any spills right away, including grease or oil spills. WHAT CAN I DO IN THE BATHROOM?  Use night lights.  Install grab bars by the toilet and in the tub and shower. Do not use towel bars as grab bars.  Use non-skid mats or decals on the floor of the tub or shower.  If you need to sit down while  you are in the shower, use a plastic, non-slip stool.Marland Kitchen  Keep the floor dry. Immediately clean up any water that spills on the floor.  Remove soap buildup in the tub or shower on a regular basis.  Attach bath mats securely with double-sided non-slip rug tape.  Remove throw rugs and other tripping hazards from the floor. WHAT CAN I DO IN THE BEDROOM?  Use night lights.  Make sure that a bedside light is easy to reach.  Do not use oversized bedding that drapes onto the floor.  Have a firm chair that has side arms to use for getting dressed.  Remove throw rugs and other tripping hazards from the floor. WHAT CAN I DO IN THE KITCHEN?   Clean up any spills right away.  Avoid walking on wet floors.  Place frequently used items in easy-to-reach places.  If you need to reach for something above you, use a sturdy step stool that has a grab bar.  Keep electrical cables out of the way.  Do not use floor polish or wax that makes floors slippery. If you have to use wax, make sure that it is non-skid floor wax.  Remove throw rugs and other tripping hazards from the floor. WHAT CAN I DO IN THE STAIRWAYS?  Do not leave any items on the stairs.  Make sure that there are handrails on both sides of the stairs. Fix handrails that are broken or loose. Make sure that handrails are as long as the stairways.  Check any carpeting to make sure that it is firmly attached to the stairs. Fix any carpet that is loose or worn.  Avoid having throw rugs at the top or bottom of stairways, or secure the rugs with carpet tape to prevent them from moving.  Make sure that you have a light switch at the top of the stairs and the bottom of the stairs. If you do not have them, have them installed. WHAT ARE SOME OTHER FALL PREVENTION TIPS?  Wear closed-toe shoes that fit well and support your feet. Wear shoes that have rubber soles or low heels.  When you use a stepladder, make sure that it is completely  opened and that the sides are firmly locked. Have someone hold the ladder while you are using it. Do not climb a closed stepladder.  Add color or contrast paint or tape to grab bars and handrails in your home. Place contrasting color strips on the first and last steps.  Use mobility aids as needed, such as canes, walkers, scooters, and crutches.  Turn on lights if it is dark. Replace any light bulbs that burn out.  Set up furniture so that there are clear paths. Keep the furniture in the same spot.  Fix any uneven floor surfaces.  Choose a carpet design that does not hide the edge of steps of a stairway.  Be aware of any and all pets.  Review your medicines with your healthcare provider. Some medicines can cause dizziness or changes in blood pressure, which increase your risk of falling. Talk with your health care provider about other ways that you can decrease your risk of falls. This may include working with a physical therapist or trainer to improve your strength, balance, and endurance.   This information is not intended to replace advice given to you by your health care provider. Make sure you discuss any questions you have with your health care provider.   Document Released: 06/04/2002 Document Revised: 10/29/2014 Document Reviewed: 07/19/2014 Elsevier Interactive Patient Education [employer].

## 2015-10-28 NOTE — ED Notes (Signed)
Urine at bedside. 

## 2015-10-28 NOTE — ED Notes (Signed)
Patient wants to use bed pan prior to xrays. Dr. Ashok Cordia made aware, stated ok to roll patient.

## 2015-10-28 NOTE — ED Notes (Signed)
Bed: [facility] Expected date:  Expected time:  Means of arrival:  Comments: No bed  

## 2015-10-28 NOTE — ED Notes (Addendum)
Per EMS, patient is legally blind and was walking around facility with walker. Patient tripped and fell; witnessed fall by staff. Patient complaining of bilateral shoulder, neck, and lower back pain. Patient is conscious, alert, and oriented. Denies head pain, denies loss of consciousness. Patient takes daily aspirin. Patient placed in C-collar and backboard by EMS. Patient is from [facility].

## 2015-10-28 NOTE — ED Provider Notes (Signed)
CSN: [id number]     Arrival date & time 10/28/15  1204 History   First MD Initiated Contact with Patient 10/28/15 1228     Chief Complaint  Patient presents with  . Fall  . Neck Pain  . Back Pain     (Consider location/radiation/quality/duration/timing/severity/associated sxs/prior Treatment) Patient is a 82 y.o. female presenting with fall, neck pain, and back pain. The history is provided by the patient and the EMS personnel. The history is limited by the condition of the patient.  Fall Associated symptoms include headaches.  Neck Pain Associated symptoms: headaches   Associated symptoms: no fever   Back Pain Associated symptoms: headaches   Associated symptoms: no fever   Patient from [facility], hx dementia, hx falls, walks w walker, witness fall, trip and fall, no loc. Hit head. Per report, c/o neck and low back pain. Pain moderate, constant, persistent since fall.  Patient very limited historian - level 5 caveat.   No fevers. No chest pain or sob. No abd pain. No nv. Denies numbness/weakness.      Past Medical History  Diagnosis Date  . Coronary artery disease   . Dementia   . Hypertension   . Hyperlipidemia   . Anxiety   . GERD (gastroesophageal reflux disease)   . CHF (congestive heart failure) ([place])   . Polyp, stomach 11/11/2006  . Aphakia of left eye   . Obese   . MI (myocardial infarction) ([place])   . Chest pain at rest 12/20/2011  . CAD (coronary artery disease), cutting balloon athrectomy of her dominant AV groove of LCX.  2007 12/20/2011  . Bradycardia 12/20/2011  . HTN (hypertension) 12/20/2011  . Dyslipidemia  12/20/2011  . Cardiomyopathy ([place])   . History of GI bleed   . Atrial fibrillation ([place])   . Sigmoid diverticulosis   . Anemia, iron deficiency 10/24/2012  . Constipation due to pain medication 10/24/2012  . Cataract     "coming back in both eyes" (06/19/2013)  . Chronic bronchitis ([place])     "I have it all year round"  . Shortness of breath     "comes on at  anytime" (06/19/2013)  . OSA on CPAP 12/20/2011  . Chronic back pain     "all over my back"   . Breast cancer, right breast ([place])   . Type II diabetes mellitus ([place])   . Headache     "right often" (08/09/2014)  . DJD (degenerative joint disease)   . Osteoarthritis   . Arthritis     "all over my body"  . Nephrolithiasis     bilateral   Past Surgical History  Procedure Laterality Date  . Back surgery    . Appendectomy    . Fracture surgery    . Dilation and curettage of uterus    . Esophagogastroduodenoscopy    . Colonoscopy    . Eus    . Esophagogastroduodenoscopy  02/21/2012    Procedure: ESOPHAGOGASTRODUODENOSCOPY (EGD);  Surgeon: Gatha Mayer, MD;  Location: [facility];  Service: Endoscopy;  Laterality: N/A;  pt. being evaluated for a pacemaker  . Balloon dilation  02/21/2012    Procedure: BALLOON DILATION;  Surgeon: Gatha Mayer, MD;  Location: [facility];  Service: Endoscopy;  Laterality: N/A;  . Colonoscopy N/A 10/24/2012    Procedure: COLONOSCOPY;  Surgeon: Gatha Mayer, MD;  Location: [facility];  Service: Endoscopy;  Laterality: N/A;  . US echocardiography  01/25/2008    mild DUST,mild MR,mod. ca+ mitral annular,AOV mildly  sclerotid  . Lexiscan mycardial perfusion scan  01/25/2008    Normal  . Inguinal hernia repair Right 06/18/2013  . Tonsillectomy    . Total abdominal hysterectomy    . Cataract extraction w/ intraocular lens  implant, bilateral Bilateral   . Breast biopsy Right   . Breast lumpectomy Right   . Inguinal hernia repair Right 06/18/2013    Procedure: HERNIA REPAIR INGUINAL ADULT;  Surgeon: Gwenyth Ober, MD;  Location: [facility];  Service: General;  Laterality: Right;  . Insertion of mesh Right 06/18/2013    Procedure: INSERTION OF MESH;  Surgeon: Gwenyth Ober, MD;  Location: [facility];  Service: General;  Laterality: Right;  . Left heart catheterization with coronary angiogram N/A 12/21/2011    Procedure: LEFT HEART CATHETERIZATION WITH CORONARY  ANGIOGRAM;  Surgeon: Lorretta Harp, MD;  Location: [facility] CATH LAB;  Service: Cardiovascular;  Laterality: N/A;  . Glaucoma surgery Bilateral   . Eye surgery    . Coronary angioplasty with stent placement      "Dr. Alvester Chou put it in; it's been in a long time"  . Coronary angioplasty  03-07-2004    cutting balloon  . Cardiac catheterization  06/05/2004    high grade disease AV groove CX  . Cardiac catheterization  04/19/2005    noncritical CAD   Family History  Problem Relation Age of Onset  . Colon cancer Mother   . Heart disease Mother   . Stroke Mother    Social History  Substance Use Topics  . Smoking status: Never Smoker   . Smokeless tobacco: Never Used  . Alcohol Use: No   OB History    No data available       Patient w dementia - level 5 caveat Review of Systems  Unable to perform ROS: Dementia  Constitutional: Negative for fever.  Gastrointestinal: Negative for vomiting.  Musculoskeletal: Positive for back pain and neck pain.  Neurological: Positive for headaches.      Allergies  Chicken allergy; Fish allergy; and Percocet  Home Medications   Prior to Admission medications   Medication Sig Start Date End Date Taking? Authorizing Provider  acetaminophen (TYLENOL) 500 MG tablet Take 500 mg by mouth 3 (three) times daily.   Yes Historical Provider, MD  amLODipine (NORVASC) 10 MG tablet Take 10 mg by mouth daily.   Yes Historical Provider, MD  Artificial Tear Ointment (SOOTHE NIGHT TIME) OINT Place 1 application into the right eye 3 (three) times daily.    Yes Historical Provider, MD  aspirin 81 MG chewable tablet Chew 81 mg by mouth daily.   Yes Historical Provider, MD  cetirizine (ZYRTEC) 5 MG tablet Take 5 mg by mouth every morning.    Yes Historical Provider, MD  cholecalciferol (VITAMIN D) 400 UNITS TABS tablet Take 800 Units by mouth daily.   Yes Historical Provider, MD  citalopram (CELEXA) 20 MG tablet Take 20 mg by mouth daily before breakfast.    Yes  Historical Provider, MD  docusate sodium (COLACE) 100 MG capsule Take 100 mg by mouth 2 (two) times daily.   Yes Historical Provider, MD  esomeprazole (NEXIUM) 40 MG capsule Take 40 mg by mouth daily before breakfast.   Yes Historical Provider, MD  acetaminophen (TYLENOL) 500 MG tablet Take 1-2 tablets (500-1,000 mg total) by mouth every 6 (six) hours as needed for moderate pain or headache (Take 1 tablet for mild pain, 2 tablets for severe pain). 10/20/15   Duffy Bruce, MD  fluticasone (  FLONASE) 50 MCG/ACT nasal spray Place 2 sprays into both nostrils daily.     Historical Provider, MD  hydrALAZINE (APRESOLINE) 25 MG tablet Take 37.5 mg by mouth 3 (three) times daily. Take at 6am , 2pm, 8pm    Historical Provider, MD  isosorbide mononitrate (IMDUR) 60 MG 24 hr tablet Take 1 tablet (60 mg total) by mouth daily before breakfast. 11/16/12   Isaiah Serge, NP  ketorolac (ACULAR) 0.5 % ophthalmic solution Place 1 drop into both eyes daily.    Historical Provider, MD  losartan-hydrochlorothiazide (HYZAAR) 100-25 MG per tablet Take 1 tablet by mouth daily before breakfast.     Historical Provider, MD  pravastatin (PRAVACHOL) 40 MG tablet Take 40 mg by mouth every evening.    Historical Provider, MD  spironolactone (ALDACTONE) 25 MG tablet Take 25 mg by mouth daily before breakfast.     Historical Provider, MD  tamsulosin (FLOMAX) 0.4 MG CAPS capsule Take 1 capsule (0.4 mg total) by mouth daily. 08/12/14   Janece Canterbury, MD  traMADol (ULTRAM) 50 MG tablet Take 50 mg by mouth 2 (two) times daily.    Historical Provider, MD   BP 170/59 mmHg  Pulse 63  Temp(Src) 97.5 F (36.4 C) (Oral)  Resp 18  Ht 5\' 6"  (1.676 m)  Wt 58.06 kg  BMI 20.67 kg/m2  SpO2 98% Physical Exam  Constitutional: She appears well-developed and well-nourished. No distress.  HENT:  Mouth/Throat: Oropharynx is clear and moist.  Tenderness/contusion to scalp.   Eyes: Conjunctivae are normal. Pupils are equal, round, and reactive  to light. No scleral icterus.  Neck: Neck supple. No tracheal deviation present.  No bruit  Cardiovascular: Normal rate, regular rhythm, normal heart sounds and intact distal pulses.   Pulmonary/Chest: Effort normal and breath sounds normal. No respiratory distress. She exhibits no tenderness.  Abdominal: Soft. Normal appearance. She exhibits no distension. There is no tenderness.  Musculoskeletal: She exhibits no edema.  Mid cervical and lumbar tenderness, otherwise, CTLS spine, non tender, aligned, no step off. Good rom bil ext without pain or focal bony tenderness.   Neurological: She is alert.  Awake and alert. Moves bil ext purposefully w good strength. Does not follow commands well.   Skin: Skin is warm and dry. No rash noted. She is not diaphoretic.  Psychiatric:  Content appearing.   Nursing note and vitals reviewed.   ED Course  Procedures (including critical care time)   Dg Lumbar Spine Complete  10/28/2015  CLINICAL DATA:  Status post fall, legally blind EXAM: LUMBAR SPINE - COMPLETE 4+ VIEW COMPARISON:  08/29/2015 FINDINGS: There are 5 nonrib bearing lumbar-type vertebral bodies. The vertebral body heights are maintained. There is no spondylolysis. There is grade 1 anterolisthesis of L4 on L5 secondary to facet disease. There is 5 mm of retrolisthesis of L2 on L3 and L1 on L2. There is no acute fracture. Degenerative disc disease with disc height loss at L1-2 and L5-S1. Bilateral facet arthropathy at L3-4, L4-5 and L5-S1. The SI joints are unremarkable. There is abdominal aortic atherosclerosis. There are bilateral nephrolithiasis. IMPRESSION: Lumbar spine spondylosis as described above. Electronically Signed   By: Kathreen Devoid   On: 10/28/2015 13:28   Ct Head Wo Contrast  10/28/2015  CLINICAL DATA:  Fall, pain, walking with a walker and tripped EXAM: CT HEAD WITHOUT CONTRAST CT CERVICAL SPINE WITHOUT CONTRAST TECHNIQUE: Multidetector CT imaging of the head and cervical spine was  performed following the standard protocol without intravenous contrast. Multiplanar  CT image reconstructions of the cervical spine were also generated. COMPARISON:  10/20/2015 FINDINGS: CT HEAD FINDINGS No skull fracture is noted. Paranasal sinuses and mastoid air cells are unremarkable. No intracranial hemorrhage, mass effect or midline shift. Stable atrophy and chronic white matter disease. Ventricular size is stable from prior exam. No acute cortical infarction. No mass lesion is noted on this unenhanced scan. CT CERVICAL SPINE FINDINGS Axial images of the cervical spine shows no acute fracture or subluxation. Atherosclerotic calcifications bilateral carotid bifurcation again noted. Again noted disc space flattening with mild anterior and mild posterior spurring at C3-C4 level. Stable mild stenosis due to posterior spurring at C3-C4 and C4-C5 level. Significant disc space flattening with moderate anterior and mild posterior spurring at C4-C5 level. Disc space flattening with moderate anterior and mild posterior spurring at C5-C6 level. Moderate spinal canal stenosis at C5-C6 level. There is moderate disc space flattening with mild posterior and moderate anterior spurring at C6-C7 level. No prevertebral soft tissue swelling. Cervical airway is patent. Again noted low-density nodule left lobe of thyroid gland measures 2.2 cm. There is no pneumothorax in visualized lung apices. Bilateral apical scarring is noted. Computer processed images shows no acute fracture or subluxation. Multilevel facet degenerative changes again noted. IMPRESSION: 1. No acute intracranial abnormality. Stable atrophy and chronic white matter disease. 2. No cervical spine acute fracture or subluxation. Multilevel degenerative changes as described above most significant at C5-C6 level. 3. Again noted low-density lesion left lobe of thyroid gland measures 2.2 cm. Further correlation with thyroid gland ultrasound is again recommended.  Electronically Signed   By: Lahoma Crocker M.D.   On: 10/28/2015 13:43     Ct Cervical Spine Wo Contrast  10/28/2015  CLINICAL DATA:  Fall, pain, walking with a walker and tripped EXAM: CT HEAD WITHOUT CONTRAST CT CERVICAL SPINE WITHOUT CONTRAST TECHNIQUE: Multidetector CT imaging of the head and cervical spine was performed following the standard protocol without intravenous contrast. Multiplanar CT image reconstructions of the cervical spine were also generated. COMPARISON:  10/20/2015 FINDINGS: CT HEAD FINDINGS No skull fracture is noted. Paranasal sinuses and mastoid air cells are unremarkable. No intracranial hemorrhage, mass effect or midline shift. Stable atrophy and chronic white matter disease. Ventricular size is stable from prior exam. No acute cortical infarction. No mass lesion is noted on this unenhanced scan. CT CERVICAL SPINE FINDINGS Axial images of the cervical spine shows no acute fracture or subluxation. Atherosclerotic calcifications bilateral carotid bifurcation again noted. Again noted disc space flattening with mild anterior and mild posterior spurring at C3-C4 level. Stable mild stenosis due to posterior spurring at C3-C4 and C4-C5 level. Significant disc space flattening with moderate anterior and mild posterior spurring at C4-C5 level. Disc space flattening with moderate anterior and mild posterior spurring at C5-C6 level. Moderate spinal canal stenosis at C5-C6 level. There is moderate disc space flattening with mild posterior and moderate anterior spurring at C6-C7 level. No prevertebral soft tissue swelling. Cervical airway is patent. Again noted low-density nodule left lobe of thyroid gland measures 2.2 cm. There is no pneumothorax in visualized lung apices. Bilateral apical scarring is noted. Computer processed images shows no acute fracture or subluxation. Multilevel facet degenerative changes again noted. IMPRESSION: 1. No acute intracranial abnormality. Stable atrophy and chronic  white matter disease. 2. No cervical spine acute fracture or subluxation. Multilevel degenerative changes as described above most significant at C5-C6 level. 3. Again noted low-density lesion left lobe of thyroid gland measures 2.2 cm. Further correlation with thyroid  gland ultrasound is again recommended. Electronically Signed   By: Lahoma Crocker M.D.   On: 10/28/2015 13:43     I have personally reviewed and evaluated these images as part of my medical decision-making.   MDM   Monitor.   Imaging studies ordered.  Reviewed nursing notes and prior charts for additional history.   Recheck, pt content and alert.  Tylenol po.  Spine without consistent, focal, bony tenderness. Chest cta. abd soft nt.  Pt currently appears stable for d/c.      Lajean Saver, MD 10/28/15 1409

## 2015-10-28 NOTE — ED Notes (Signed)
Bed: [id number] Expected date:  Expected time:  Means of arrival:  Comments: Ems-witnessed fall elderly

## 2015-10-29 IMAGING — CR DG CHEST 1V
1 series · 1 of 1 positions shown · non-contrast
Comparison: DG CHEST 2 VIEW dated 08/23/2013; DG FEMUR*R* dated
10/28/2013

CLINICAL DATA: Status post fall last week, right hip pain, no chest
complaints

EXAM:
CHEST - 1 VIEW

[x chest ap]
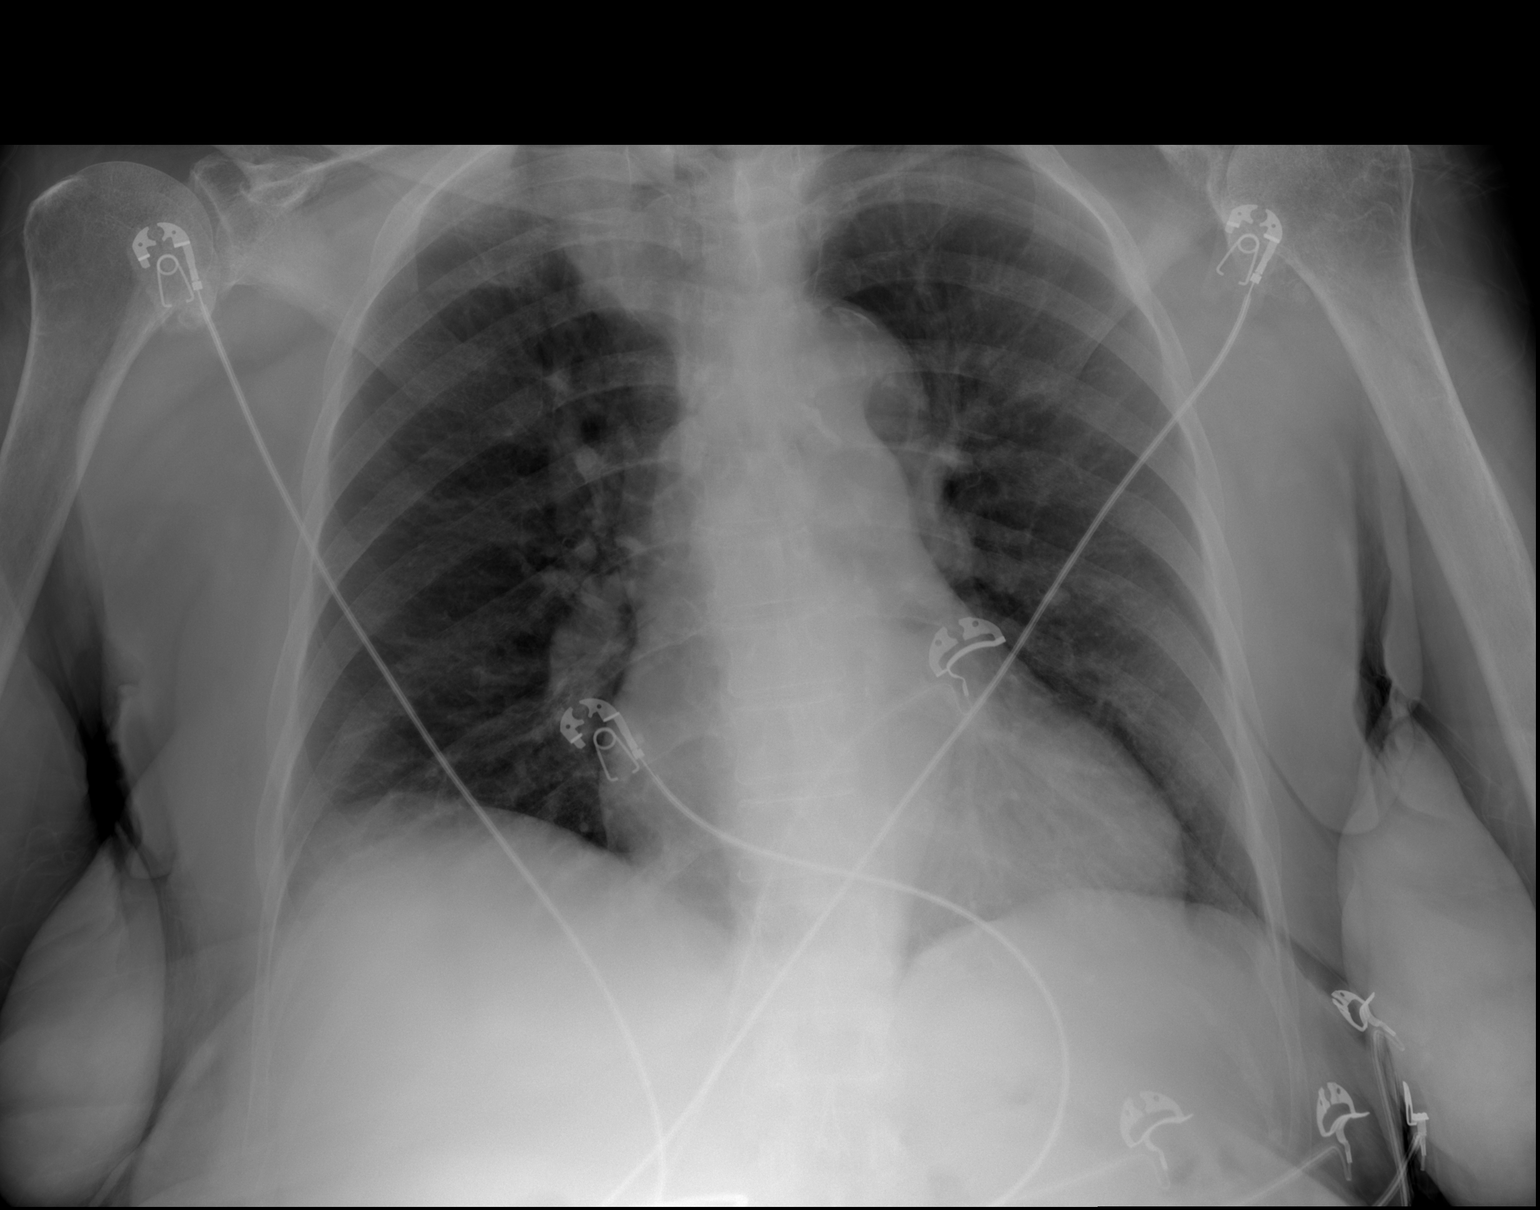

[1 of 1 positions shown; findings below may reference images not displayed]

FINDINGS: There is no focal parenchymal opacity, pleural effusion, or
pneumothorax. The heart and mediastinal contours are unremarkable.
There is thoracic aortic atherosclerosis.

The osseous structures are unremarkable.
IMPRESSION: No active disease.

## 2015-12-18 ENCOUNTER — Encounter (HOSPITAL_COMMUNITY): Payer: Self-pay | Admitting: Emergency Medicine

## 2015-12-18 ENCOUNTER — Emergency Department (HOSPITAL_COMMUNITY)
Admission: EM | Admit: 2015-12-18 | Discharge: 2015-12-19 | Disposition: A | Discharge status: Home / Self Care | Payer: Medicare Other | Attending: Emergency Medicine | Admitting: Emergency Medicine

## 2015-12-18 ENCOUNTER — Emergency Department (HOSPITAL_COMMUNITY): Payer: Medicare Other

## 2015-12-18 DIAGNOSIS — Y929 Unspecified place or not applicable: Secondary | ICD-10-CM | POA: Diagnosis not present

## 2015-12-18 DIAGNOSIS — I509 Heart failure, unspecified: Secondary | ICD-10-CM | POA: Insufficient documentation

## 2015-12-18 DIAGNOSIS — M1990 Unspecified osteoarthritis, unspecified site: Secondary | ICD-10-CM | POA: Insufficient documentation

## 2015-12-18 DIAGNOSIS — I4891 Unspecified atrial fibrillation: Secondary | ICD-10-CM | POA: Diagnosis not present

## 2015-12-18 DIAGNOSIS — E785 Hyperlipidemia, unspecified: Secondary | ICD-10-CM | POA: Diagnosis not present

## 2015-12-18 DIAGNOSIS — Y939 Activity, unspecified: Secondary | ICD-10-CM | POA: Diagnosis not present

## 2015-12-18 DIAGNOSIS — Z9861 Coronary angioplasty status: Secondary | ICD-10-CM | POA: Diagnosis not present

## 2015-12-18 DIAGNOSIS — Z79899 Other long term (current) drug therapy: Secondary | ICD-10-CM | POA: Diagnosis not present

## 2015-12-18 DIAGNOSIS — M533 Sacrococcygeal disorders, not elsewhere classified: Secondary | ICD-10-CM | POA: Insufficient documentation

## 2015-12-18 DIAGNOSIS — I252 Old myocardial infarction: Secondary | ICD-10-CM | POA: Diagnosis not present

## 2015-12-18 DIAGNOSIS — Z7951 Long term (current) use of inhaled steroids: Secondary | ICD-10-CM | POA: Diagnosis not present

## 2015-12-18 DIAGNOSIS — W19XXXA Unspecified fall, initial encounter: Secondary | ICD-10-CM | POA: Insufficient documentation

## 2015-12-18 DIAGNOSIS — I429 Cardiomyopathy, unspecified: Secondary | ICD-10-CM | POA: Diagnosis not present

## 2015-12-18 DIAGNOSIS — Z7982 Long term (current) use of aspirin: Secondary | ICD-10-CM | POA: Diagnosis not present

## 2015-12-18 DIAGNOSIS — F0390 Unspecified dementia without behavioral disturbance: Secondary | ICD-10-CM | POA: Insufficient documentation

## 2015-12-18 DIAGNOSIS — Y999 Unspecified external cause status: Secondary | ICD-10-CM | POA: Insufficient documentation

## 2015-12-18 DIAGNOSIS — Z853 Personal history of malignant neoplasm of breast: Secondary | ICD-10-CM | POA: Diagnosis not present

## 2015-12-18 DIAGNOSIS — I2510 Atherosclerotic heart disease of native coronary artery without angina pectoris: Secondary | ICD-10-CM | POA: Insufficient documentation

## 2015-12-18 DIAGNOSIS — I110 Hypertensive heart disease with heart failure: Secondary | ICD-10-CM | POA: Insufficient documentation

## 2015-12-18 DIAGNOSIS — E119 Type 2 diabetes mellitus without complications: Secondary | ICD-10-CM | POA: Insufficient documentation

## 2015-12-18 NOTE — ED Notes (Signed)
Patient here via EMS from [facility] with complaints of fall today while getting out of bed. Hx of dementia. Tailbone pain 10/10.

## 2015-12-18 NOTE — ED Provider Notes (Signed)
CSN: [id number]     Arrival date & time 12/18/15  2238 History  By signing my name below, I, [name], attest that this documentation has been prepared under the direction and in the presence of [name], MD. Electronically Signed: [name], ED Scribe. 12/18/2015. 11:11 PM.   Chief Complaint  Patient presents with  . Fall  . Tailbone Pain   Patient is a 82 y.o. female presenting with fall. The history is provided by the nursing home. No language interpreter was used.  Fall This is a new problem. The current episode started 3 to 5 hours ago. The problem occurs constantly. The problem has not changed since onset.Pertinent negatives include no shortness of breath. Nothing aggravates the symptoms. Nothing relieves the symptoms. She has tried nothing for the symptoms. The treatment provided no relief.  HPI Comments (Level 5 Caveat due to dementia): [name] is a 82 y.o. Female with a hx of CAD, Dementia, HTN, CHF, MI, cardiomyopathy, bradycardia, Type II DM and osteoarthritis brought in by EMS from [place] who presents to the Emergency Department complaining of an unwitnessed fall onset PTA. Per nursing home staff, pt fell while getting out of bed. She complains of tailbone pain. Fall was unwitnessed but nursing home staff denies LOC.    Past Medical History  Diagnosis Date  . Coronary artery disease   . Dementia   . Hypertension   . Hyperlipidemia   . Anxiety   . GERD (gastroesophageal reflux disease)   . CHF (congestive heart failure) ([place])   . Polyp, stomach 11/11/2006  . Aphakia of left eye   . Obese   . MI (myocardial infarction) ([place])   . Chest pain at rest 12/20/2011  . CAD (coronary artery disease), cutting balloon athrectomy of her dominant AV groove of LCX.  2007 12/20/2011  . Bradycardia 12/20/2011  . HTN (hypertension) 12/20/2011  . Dyslipidemia  12/20/2011  . Cardiomyopathy ([place])   . History of GI bleed   . Atrial fibrillation ([place])   . Sigmoid  diverticulosis   . Anemia, iron deficiency 10/24/2012  . Constipation due to pain medication 10/24/2012  . Cataract     "coming back in both eyes" (06/19/2013)  . Chronic bronchitis ([place])     "I have it all year round"  . Shortness of breath     "comes on at anytime" (06/19/2013)  . OSA on CPAP 12/20/2011  . Chronic back pain     "all over my back"   . Breast cancer, right breast ([place])   . Type II diabetes mellitus ([place])   . Headache     "right often" (08/09/2014)  . DJD (degenerative joint disease)   . Osteoarthritis   . Arthritis     "all over my body"  . Nephrolithiasis     bilateral   Past Surgical History  Procedure Laterality Date  . Back surgery    . Appendectomy    . Fracture surgery    . Dilation and curettage of uterus    . Esophagogastroduodenoscopy    . Colonoscopy    . Eus    . Esophagogastroduodenoscopy  02/21/2012    Procedure: ESOPHAGOGASTRODUODENOSCOPY (EGD);  Surgeon: Gatha Mayer, MD;  Location: [facility];  Service: Endoscopy;  Laterality: N/A;  pt. being evaluated for a pacemaker  . Balloon dilation  02/21/2012    Procedure: BALLOON DILATION;  Surgeon: Gatha Mayer, MD;  Location: [facility];  Service: Endoscopy;  Laterality: N/A;  . Colonoscopy  N/A 10/24/2012    Procedure: COLONOSCOPY;  Surgeon: Gatha Mayer, MD;  Location: [facility];  Service: Endoscopy;  Laterality: N/A;  . US echocardiography  01/25/2008    mild DUST,mild MR,mod. ca+ mitral annular,AOV mildly sclerotid  . Lexiscan mycardial perfusion scan  01/25/2008    Normal  . Inguinal hernia repair Right 06/18/2013  . Tonsillectomy    . Total abdominal hysterectomy    . Cataract extraction w/ intraocular lens  implant, bilateral Bilateral   . Breast biopsy Right   . Breast lumpectomy Right   . Inguinal hernia repair Right 06/18/2013    Procedure: HERNIA REPAIR INGUINAL ADULT;  Surgeon: Gwenyth Ober, MD;  Location: [facility];  Service: General;  Laterality: Right;  . Insertion of mesh  Right 06/18/2013    Procedure: INSERTION OF MESH;  Surgeon: Gwenyth Ober, MD;  Location: [facility];  Service: General;  Laterality: Right;  . Left heart catheterization with coronary angiogram N/A 12/21/2011    Procedure: LEFT HEART CATHETERIZATION WITH CORONARY ANGIOGRAM;  Surgeon: Lorretta Harp, MD;  Location: [facility] CATH LAB;  Service: Cardiovascular;  Laterality: N/A;  . Glaucoma surgery Bilateral   . Eye surgery    . Coronary angioplasty with stent placement      "Dr. Alvester Chou put it in; it's been in a long time"  . Coronary angioplasty  03-07-2004    cutting balloon  . Cardiac catheterization  06/05/2004    high grade disease AV groove CX  . Cardiac catheterization  04/19/2005    noncritical CAD   Family History  Problem Relation Age of Onset  . Colon cancer Mother   . Heart disease Mother   . Stroke Mother    Social History  Substance Use Topics  . Smoking status: Never Smoker   . Smokeless tobacco: Never Used  . Alcohol Use: No   OB History    No data available     Review of Systems  Unable to perform ROS: Dementia  Respiratory: Negative for shortness of breath.    Allergies  Chicken allergy; Fish allergy; and Percocet  Home Medications   Prior to Admission medications   Medication Sig Start Date End Date Taking? Authorizing Provider  acetaminophen (TYLENOL) 500 MG tablet Take 1-2 tablets (500-1,000 mg total) by mouth every 6 (six) hours as needed for moderate pain or headache (Take 1 tablet for mild pain, 2 tablets for severe pain). Patient not taking: Reported on 10/28/2015 10/20/15   Duffy Bruce, MD  acetaminophen (TYLENOL) 500 MG tablet Take 500 mg by mouth 3 (three) times daily.    Historical Provider, MD  amLODipine (NORVASC) 10 MG tablet Take 10 mg by mouth daily.    Historical Provider, MD  Artificial Tear Ointment (SOOTHE NIGHT TIME) OINT Place 1 application into the right eye 3 (three) times daily.     Historical Provider, MD  aspirin 81 MG chewable tablet Chew  81 mg by mouth daily.    Historical Provider, MD  cetirizine (ZYRTEC) 5 MG tablet Take 5 mg by mouth every morning.     Historical Provider, MD  cholecalciferol (VITAMIN D) 400 UNITS TABS tablet Take 800 Units by mouth daily.    Historical Provider, MD  citalopram (CELEXA) 20 MG tablet Take 20 mg by mouth daily before breakfast.     Historical Provider, MD  docusate sodium (COLACE) 100 MG capsule Take 100 mg by mouth 2 (two) times daily.    Historical Provider, MD  esomeprazole (NEXIUM) 40 MG  capsule Take 40 mg by mouth daily before breakfast.    Historical Provider, MD  fluticasone (FLONASE) 50 MCG/ACT nasal spray Place 2 sprays into both nostrils daily.     Historical Provider, MD  hydrALAZINE (APRESOLINE) 25 MG tablet Take 37.5 mg by mouth 3 (three) times daily. Take at 6am , 2pm, 8pm    Historical Provider, MD  isosorbide mononitrate (IMDUR) 60 MG 24 hr tablet Take 1 tablet (60 mg total) by mouth daily before breakfast. 11/16/12   Isaiah Serge, NP  ketorolac (ACULAR) 0.5 % ophthalmic solution Place 1 drop into both eyes daily.    Historical Provider, MD  losartan-hydrochlorothiazide (HYZAAR) 100-25 MG per tablet Take 1 tablet by mouth daily before breakfast.     Historical Provider, MD  pravastatin (PRAVACHOL) 40 MG tablet Take 40 mg by mouth every evening.    Historical Provider, MD  spironolactone (ALDACTONE) 25 MG tablet Take 25 mg by mouth daily before breakfast.     Historical Provider, MD  tamsulosin (FLOMAX) 0.4 MG CAPS capsule Take 1 capsule (0.4 mg total) by mouth daily. 08/12/14   Janece Canterbury, MD  traMADol (ULTRAM) 50 MG tablet Take 50 mg by mouth 2 (two) times daily.    Historical Provider, MD   BP 145/81 mmHg  Pulse 64  Temp(Src) 97.5 F (36.4 C) (Oral)  Resp 18  SpO2 99% Physical Exam  Constitutional: She appears well-developed and well-nourished. No distress.  HENT:  Head: Normocephalic and atraumatic. Head is without raccoon's eyes and without Battle's sign.  Right  Ear: No hemotympanum.  Left Ear: No hemotympanum.  Mouth/Throat: Oropharynx is clear and moist. No oropharyngeal exudate.  Trachea midline  Eyes: Conjunctivae and EOM are normal. Pupils are equal, round, and reactive to light.  Neck: Trachea normal and normal range of motion. Neck supple. No JVD present. Carotid bruit is not present.  Cardiovascular: Normal rate and regular rhythm.  Exam reveals no gallop and no friction rub.   No murmur heard. Pulmonary/Chest: Effort normal and breath sounds normal. No stridor. She has no wheezes. She has no rales.  Abdominal: Soft. Bowel sounds are normal. She exhibits no mass. There is no tenderness. There is no rebound and no guarding.  Musculoskeletal: Normal range of motion.  No fore shorting or internal rotation  Lymphadenopathy:    She has no cervical adenopathy.  Neurological: She is alert. She has normal reflexes. No cranial nerve deficit. She exhibits normal muscle tone. Coordination normal.  Cranial nerves 2-12 intact  Skin: Skin is warm and dry. No lesion noted. She is not diaphoretic.  Psychiatric: She has a normal mood and affect. Her behavior is normal.    ED Course  Procedures (including critical care time) DIAGNOSTIC STUDIES: Oxygen Saturation is 99% on RA, normal by my interpretation.    COORDINATION OF CARE: 11:08 PM-imaging  Labs Review Labs Reviewed - No data to display  Imaging Review No results found. I have personally reviewed and evaluated these images and lab results as part of my medical decision-making.   EKG Interpretation None      MDM   Filed Vitals:   12/18/15 2239  BP: 145/81  Pulse: 64  Temp: 97.5 F (36.4 C)  Resp: 18   Medications - No data to display   No results found.   . Final diagnoses:  None   Filed Vitals:   12/18/15 2239  BP: 145/81  Pulse: 64  Temp: 97.5 F (36.4 C)  Resp: 18  Results for orders placed or performed during the hospital encounter of 08/29/15  CBC with  Differential  Result Value Ref Range   WBC 4.4 4.0 - 10.5 K/uL   RBC 3.84 (L) 3.87 - 5.11 MIL/uL   Hemoglobin 11.4 (L) 12.0 - 15.0 g/dL   HCT 34.5 (L) 36.0 - 46.0 %   MCV 89.8 78.0 - 100.0 fL   MCH 29.7 26.0 - 34.0 pg   MCHC 33.0 30.0 - 36.0 g/dL   RDW 15.1 11.5 - 15.5 %   Platelets 234 150 - 400 K/uL   Neutrophils Relative % 64 %   Neutro Abs 2.8 1.7 - 7.7 K/uL   Lymphocytes Relative 23 %   Lymphs Abs 1.0 0.7 - 4.0 K/uL   Monocytes Relative 12 %   Monocytes Absolute 0.5 0.1 - 1.0 K/uL   Eosinophils Relative 1 %   Eosinophils Absolute 0.1 0.0 - 0.7 K/uL   Basophils Relative 0 %   Basophils Absolute 0.0 0.0 - 0.1 K/uL  Comprehensive metabolic panel  Result Value Ref Range   Sodium 140 135 - 145 mmol/L   Potassium 3.9 3.5 - 5.1 mmol/L   Chloride 108 101 - 111 mmol/L   CO2 25 22 - 32 mmol/L   Glucose, Bld 94 65 - 99 mg/dL   BUN 22 (H) 6 - 20 mg/dL   Creatinine, Ser 0.81 0.44 - 1.00 mg/dL   Calcium 9.6 8.9 - 10.3 mg/dL   Total Protein 6.9 6.5 - 8.1 g/dL   Albumin 4.1 3.5 - 5.0 g/dL   AST 24 15 - 41 U/L   ALT 19 14 - 54 U/L   Alkaline Phosphatase 35 (L) 38 - 126 U/L   Total Bilirubin 0.9 0.3 - 1.2 mg/dL   GFR calc non Af Amer >60 >60 mL/min   GFR calc Af Amer >60 >60 mL/min   Anion gap 7 5 - 15  Urinalysis, Routine w reflex microscopic (not at [facility])  Result Value Ref Range   Color, Urine YELLOW YELLOW   APPearance CLEAR CLEAR   Specific Gravity, Urine 1.013 1.005 - 1.030   pH 7.0 5.0 - 8.0   Glucose, UA NEGATIVE NEGATIVE mg/dL   Hgb urine dipstick NEGATIVE NEGATIVE   Bilirubin Urine NEGATIVE NEGATIVE   Ketones, ur NEGATIVE NEGATIVE mg/dL   Protein, ur NEGATIVE NEGATIVE mg/dL   Nitrite NEGATIVE NEGATIVE   Leukocytes, UA NEGATIVE NEGATIVE   Dg Sacrum/coccyx  12/18/2015  CLINICAL DATA:  82 year old female with fall and bilateral hip and pelvic pain. EXAM: DG HIP (WITH OR WITHOUT PELVIS) 3-4V BILAT; SACRUM AND COCCYX - 2+ VIEW COMPARISON:  None. FINDINGS: No acute  fracture or dislocation. The bones are osteopenic. There are degenerative changes of the hip joints. IMPRESSION: No acute fracture or dislocation. Electronically Signed   By: Anner Crete M.D.   On: 12/18/2015 23:42   Ct Head Wo Contrast  12/19/2015  CLINICAL DATA:  Status post fall while getting out of bed, with concern for head or cervical spine injury. Initial encounter. EXAM: CT HEAD WITHOUT CONTRAST CT CERVICAL SPINE WITHOUT CONTRAST TECHNIQUE: Multidetector CT imaging of the head and cervical spine was performed following the standard protocol without intravenous contrast. Multiplanar CT image reconstructions of the cervical spine were also generated. COMPARISON:  CT of the head and cervical spine performed 10/28/2015 FINDINGS: CT HEAD FINDINGS There is no evidence of acute infarction, mass lesion, or intra- or extra-axial hemorrhage on CT. Prominence of the ventricles and sulci reflects moderate  cortical volume loss. Diffuse periventricular and subcortical white matter change likely reflects small vessel ischemic microangiopathy. Cerebellar atrophy is noted. The brainstem and fourth ventricle are within normal limits. The basal ganglia are unremarkable in appearance. The cerebral hemispheres demonstrate grossly normal gray-white differentiation. No mass effect or midline shift is seen. There is no evidence of fracture; visualized osseous structures are unremarkable in appearance. The visualized portions of the orbits are within normal limits. The paranasal sinuses and mastoid air cells are well-aerated. Mild soft tissue swelling is noted at the left occiput. CT CERVICAL SPINE FINDINGS There is no evidence of acute fracture or subluxation. There is grade 1 anterolisthesis of C2 on C3 and of C3 on C4. Multilevel disc space narrowing is noted along the cervical spine, with scattered anterior and posterior disc osteophyte complexes. Underlying facet disease is noted. Focal kyphosis is noted at the mid  cervical spine. Prevertebral soft tissues are within normal limits. A vague 2.5 cm hypodensity is noted at the left thyroid lobe. The visualized lung apices are clear. Mild calcification is noted at the carotid bifurcations bilaterally. IMPRESSION: 1. No evidence of traumatic intracranial injury or fracture. 2. No evidence of acute fracture or subluxation along the cervical spine. 3. Mild soft tissue swelling at the left occiput. 4. Moderate cortical volume loss and diffuse small vessel ischemic microangiopathy. 5. Degenerative change along the cervical spine, with focal kyphosis at the mid cervical spine. 6. Vague 2.5 cm hypodensity at the left thyroid lobe. Consider further evaluation with thyroid ultrasound. If patient is clinically hyperthyroid, consider nuclear medicine thyroid uptake and scan. 7. Mild calcification at the carotid bifurcations bilaterally. Electronically Signed   By: Garald Balding M.D.   On: 12/19/2015 00:21   Ct Cervical Spine Wo Contrast  12/19/2015  CLINICAL DATA:  Status post fall while getting out of bed, with concern for head or cervical spine injury. Initial encounter. EXAM: CT HEAD WITHOUT CONTRAST CT CERVICAL SPINE WITHOUT CONTRAST TECHNIQUE: Multidetector CT imaging of the head and cervical spine was performed following the standard protocol without intravenous contrast. Multiplanar CT image reconstructions of the cervical spine were also generated. COMPARISON:  CT of the head and cervical spine performed 10/28/2015 FINDINGS: CT HEAD FINDINGS There is no evidence of acute infarction, mass lesion, or intra- or extra-axial hemorrhage on CT. Prominence of the ventricles and sulci reflects moderate cortical volume loss. Diffuse periventricular and subcortical white matter change likely reflects small vessel ischemic microangiopathy. Cerebellar atrophy is noted. The brainstem and fourth ventricle are within normal limits. The basal ganglia are unremarkable in appearance. The cerebral  hemispheres demonstrate grossly normal gray-white differentiation. No mass effect or midline shift is seen. There is no evidence of fracture; visualized osseous structures are unremarkable in appearance. The visualized portions of the orbits are within normal limits. The paranasal sinuses and mastoid air cells are well-aerated. Mild soft tissue swelling is noted at the left occiput. CT CERVICAL SPINE FINDINGS There is no evidence of acute fracture or subluxation. There is grade 1 anterolisthesis of C2 on C3 and of C3 on C4. Multilevel disc space narrowing is noted along the cervical spine, with scattered anterior and posterior disc osteophyte complexes. Underlying facet disease is noted. Focal kyphosis is noted at the mid cervical spine. Prevertebral soft tissues are within normal limits. A vague 2.5 cm hypodensity is noted at the left thyroid lobe. The visualized lung apices are clear. Mild calcification is noted at the carotid bifurcations bilaterally. IMPRESSION: 1. No evidence of traumatic intracranial  injury or fracture. 2. No evidence of acute fracture or subluxation along the cervical spine. 3. Mild soft tissue swelling at the left occiput. 4. Moderate cortical volume loss and diffuse small vessel ischemic microangiopathy. 5. Degenerative change along the cervical spine, with focal kyphosis at the mid cervical spine. 6. Vague 2.5 cm hypodensity at the left thyroid lobe. Consider further evaluation with thyroid ultrasound. If patient is clinically hyperthyroid, consider nuclear medicine thyroid uptake and scan. 7. Mild calcification at the carotid bifurcations bilaterally. Electronically Signed   By: Garald Balding M.D.   On: 12/19/2015 00:21   Dg Hips Bilat With Pelvis 3-4 Views  12/18/2015  CLINICAL DATA:  82 year old female with fall and bilateral hip and pelvic pain. EXAM: DG HIP (WITH OR WITHOUT PELVIS) 3-4V BILAT; SACRUM AND COCCYX - 2+ VIEW COMPARISON:  None. FINDINGS: No acute fracture or  dislocation. The bones are osteopenic. There are degenerative changes of the hip joints. IMPRESSION: No acute fracture or dislocation. Electronically Signed   By: Anner Crete M.D.   On: 12/18/2015 23:42    Medications - No data to display  Follow up with your PMD. Strict return precautions  I personally performed the services described in this documentation, which was scribed in my presence. The recorded information has been reviewed and is accurate.     Veatrice Kells, MD 12/19/15 [phone]

## 2015-12-18 NOTE — ED Notes (Signed)
Bed: [id number] Expected date:  Expected time:  Means of arrival:  Comments: 82 yr old fall, headache, lsb

## 2015-12-19 NOTE — ED Notes (Signed)
[facility] called  

## 2016-01-07 ENCOUNTER — Other Ambulatory Visit (HOSPITAL_COMMUNITY): Payer: Self-pay | Admitting: Internal Medicine

## 2016-01-07 DIAGNOSIS — R1310 Dysphagia, unspecified: Secondary | ICD-10-CM

## 2016-01-12 ENCOUNTER — Ambulatory Visit (HOSPITAL_COMMUNITY)
Admission: RE | Admit: 2016-01-12 | Discharge: 2016-01-12 | Disposition: A | Discharge status: Home / Self Care | Payer: Medicare Other | Source: Ambulatory Visit | Attending: Internal Medicine | Admitting: Internal Medicine

## 2016-01-12 DIAGNOSIS — K219 Gastro-esophageal reflux disease without esophagitis: Secondary | ICD-10-CM | POA: Diagnosis not present

## 2016-01-12 DIAGNOSIS — R1311 Dysphagia, oral phase: Secondary | ICD-10-CM | POA: Diagnosis present

## 2016-01-12 DIAGNOSIS — R1310 Dysphagia, unspecified: Secondary | ICD-10-CM

## 2016-02-09 ENCOUNTER — Emergency Department (HOSPITAL_COMMUNITY): Payer: Medicare Other

## 2016-02-09 ENCOUNTER — Encounter (HOSPITAL_COMMUNITY): Payer: Self-pay | Admitting: *Deleted

## 2016-02-09 ENCOUNTER — Emergency Department (HOSPITAL_COMMUNITY)
Admission: EM | Admit: 2016-02-09 | Discharge: 2016-02-09 | Disposition: A | Discharge status: Home / Self Care | Payer: Medicare Other | Attending: Emergency Medicine | Admitting: Emergency Medicine

## 2016-02-09 DIAGNOSIS — E871 Hypo-osmolality and hyponatremia: Secondary | ICD-10-CM

## 2016-02-09 DIAGNOSIS — I2510 Atherosclerotic heart disease of native coronary artery without angina pectoris: Secondary | ICD-10-CM | POA: Insufficient documentation

## 2016-02-09 DIAGNOSIS — E119 Type 2 diabetes mellitus without complications: Secondary | ICD-10-CM | POA: Insufficient documentation

## 2016-02-09 DIAGNOSIS — R51 Headache: Secondary | ICD-10-CM | POA: Diagnosis not present

## 2016-02-09 DIAGNOSIS — I110 Hypertensive heart disease with heart failure: Secondary | ICD-10-CM | POA: Insufficient documentation

## 2016-02-09 DIAGNOSIS — Z7982 Long term (current) use of aspirin: Secondary | ICD-10-CM | POA: Insufficient documentation

## 2016-02-09 DIAGNOSIS — Z79899 Other long term (current) drug therapy: Secondary | ICD-10-CM | POA: Diagnosis not present

## 2016-02-09 DIAGNOSIS — I509 Heart failure, unspecified: Secondary | ICD-10-CM | POA: Diagnosis not present

## 2016-02-09 DIAGNOSIS — N390 Urinary tract infection, site not specified: Secondary | ICD-10-CM | POA: Diagnosis not present

## 2016-02-09 DIAGNOSIS — R1033 Periumbilical pain: Secondary | ICD-10-CM

## 2016-02-09 DIAGNOSIS — Z853 Personal history of malignant neoplasm of breast: Secondary | ICD-10-CM | POA: Insufficient documentation

## 2016-02-09 LAB — COMPREHENSIVE METABOLIC PANEL
ALBUMIN: 4.3 g/dL (ref 3.5–5.0)
ALK PHOS: 36 U/L — AB (ref 38–126)
ALT: 12 U/L — AB (ref 14–54)
AST: 19 U/L (ref 15–41)
Anion gap: 11 (ref 5–15)
BILIRUBIN TOTAL: 0.7 mg/dL (ref 0.3–1.2)
BUN: 24 mg/dL — ABNORMAL HIGH (ref 6–20)
CALCIUM: 9.8 mg/dL (ref 8.9–10.3)
CO2: 24 mmol/L (ref 22–32)
CREATININE: 0.82 mg/dL (ref 0.44–1.00)
Chloride: 93 mmol/L — ABNORMAL LOW (ref 101–111)
GFR calc non Af Amer: >60 mL/min (ref >60)
GLUCOSE: 91 mg/dL (ref 65–99)
Potassium: 3.8 mmol/L (ref 3.5–5.1)
Sodium: 128 mmol/L — ABNORMAL LOW (ref 135–145)
Total Protein: 7.5 g/dL (ref 6.5–8.1)

## 2016-02-09 LAB — URINALYSIS, ROUTINE W REFLEX MICROSCOPIC
BILIRUBIN URINE: NEGATIVE
GLUCOSE, UA: NEGATIVE mg/dL
HGB URINE DIPSTICK: NEGATIVE
KETONES UR: NEGATIVE mg/dL
Nitrite: NEGATIVE
PROTEIN: NEGATIVE mg/dL
Specific Gravity, Urine: 1.014 (ref 1.005–1.030)
pH: 6.5 (ref 5.0–8.0)

## 2016-02-09 LAB — I-STAT CG4 LACTIC ACID, ED: Lactic Acid, Venous: 0.9 mmol/L (ref 0.5–1.9)

## 2016-02-09 LAB — CBC WITH DIFFERENTIAL/PLATELET
Basophils Absolute: 0 10*3/uL (ref 0.0–0.1)
Basophils Relative: 1 %
Eosinophils Absolute: 0 10*3/uL (ref 0.0–0.7)
Eosinophils Relative: 0 %
HEMATOCRIT: 34.7 % — AB (ref 36.0–46.0)
HEMOGLOBIN: 11.9 g/dL — AB (ref 12.0–15.0)
LYMPHS ABS: 1.2 10*3/uL (ref 0.7–4.0)
Lymphocytes Relative: 33 %
MCH: 29.1 pg (ref 26.0–34.0)
MCHC: 34.3 g/dL (ref 30.0–36.0)
MCV: 84.8 fL (ref 78.0–100.0)
MONOS PCT: 12 %
Monocytes Absolute: 0.4 10*3/uL (ref 0.1–1.0)
NEUTROS ABS: 2 10*3/uL (ref 1.7–7.7)
NEUTROS PCT: 54 %
Platelets: 279 10*3/uL (ref 150–400)
RBC: 4.09 MIL/uL (ref 3.87–5.11)
RDW: 13.2 % (ref 11.5–15.5)
WBC: 3.6 10*3/uL — AB (ref 4.0–10.5)

## 2016-02-09 LAB — URINE MICROSCOPIC-ADD ON: RBC / HPF: NONE SEEN RBC/hpf (ref 0–5)

## 2016-02-09 LAB — LIPASE, BLOOD: LIPASE: 32 U/L (ref 11–51)

## 2016-02-09 MED ORDER — CEPHALEXIN 500 MG PO CAPS: 500.0000 mg | ORAL_CAPSULE | Freq: Two times a day (BID) | ORAL | 0 refills | Status: AC

## 2016-02-09 MED ORDER — IOPAMIDOL (ISOVUE-300) INJECTION 61%
100.0000 mL | Freq: Once | INTRAVENOUS | Status: AC | PRN
  Administered 2016-02-09: 80 mL via INTRAVENOUS
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
## 2016-02-09 NOTE — ED Notes (Signed)
Bed: [id number] Expected date:  Expected time:  Means of arrival:  Comments: 74F/AMS
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
## 2016-02-09 NOTE — ED Notes (Signed)
2 unsucessful IV attempts by this RN.

## 2016-07-29 DEATH — deceased

## 2016-09-27 IMAGING — CT CT CERVICAL SPINE W/O CM
2 of 4 series · 6 of 14 positions shown, 7 images · non-contrast
Comparison: 09/13/2014

CLINICAL DATA: Fall with shoulder and neck pain.

EXAM:
CT HEAD WITHOUT CONTRAST
CT CERVICAL SPINE WITHOUT CONTRAST
TECHNIQUE: Multidetector CT imaging of the head and cervical spine was
performed following the standard protocol without intravenous
contrast. Multiplanar CT image reconstructions of the cervical spine
were also generated.

[Series 4: c-spine st · axial · 0.25mm/px · z∈[+1289,+1377]mm · 3 of 89 slices shown]
[im 23/89  bone]
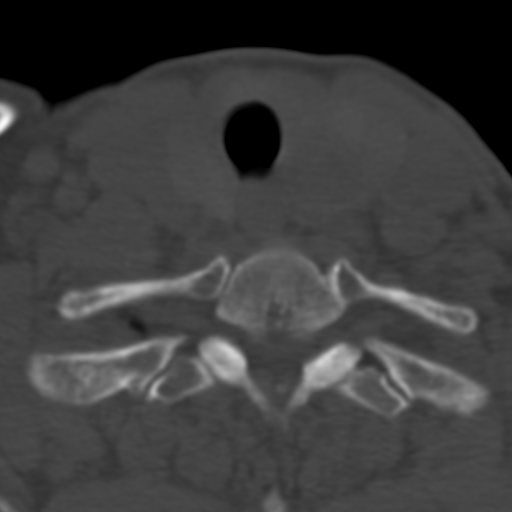
[im 45/89  bone]
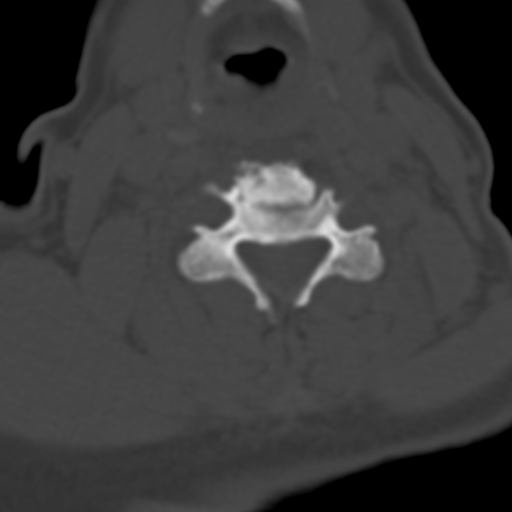
[im 67/89  bone]
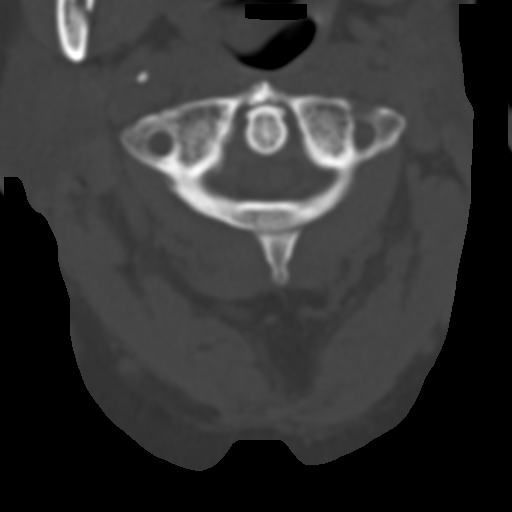

[Series 7: axial recon · axial · 0.23mm/px · z∈[+1261,+1338]mm · 3 of 88 slices shown, 4 images]
[im 22/88  soft-tissue]
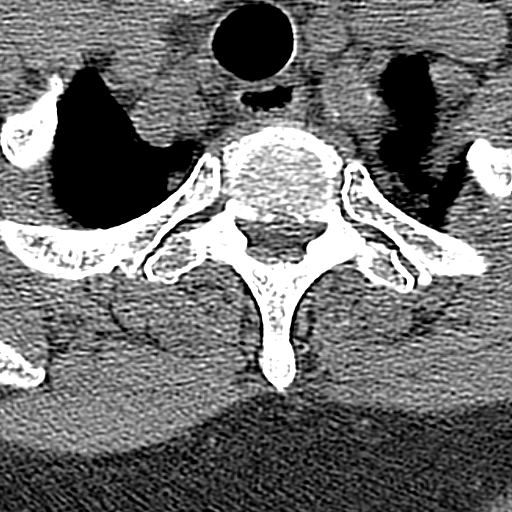
[im 22/88  bone]
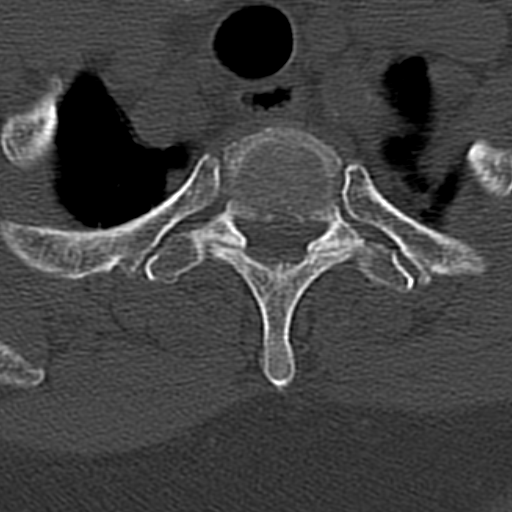
[im 44/88  bone]
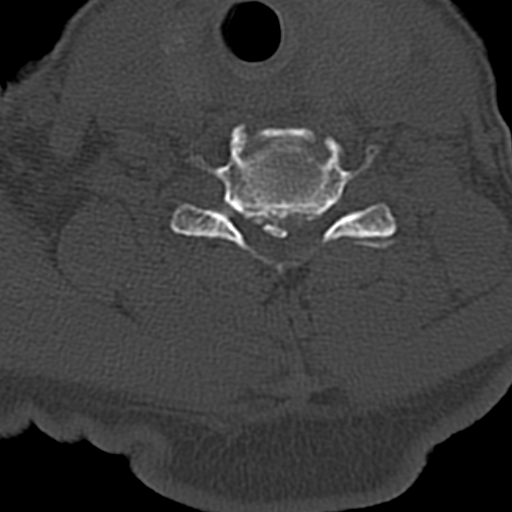
[im 66/88  bone]
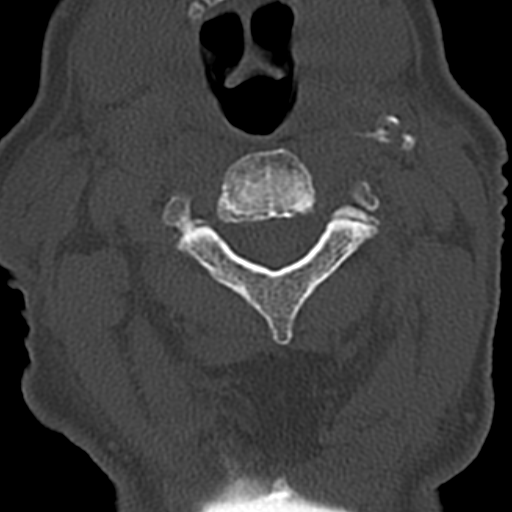

[6 of 14 positions shown; findings below may reference images not displayed]

FINDINGS: CT HEAD FINDINGS

Sinuses/Soft tissues: Mucous retention cyst or polyp in the right
maxillary sinus. Hypoplastic right frontal sinus. No scalp soft
tissue swelling or skull fracture.

Intracranial: Marked low density in the periventricular white matter
likely related to small vessel disease. Cerebral atrophy.
Ventriculomegaly is felt to be secondary. No mass lesion,
hemorrhage, hydrocephalus, acute infarct, intra-axial, or
extra-axial fluid collection.

CT CERVICAL SPINE FINDINGS

Spinal visualization through the bottom of T2. Prevertebral soft
tissues are within normal limits. Dense carotid atherosclerosis.
Left-sided thyroid enlargement, likely due to multinodular goiter.
No apical pneumothorax.

Multilevel spondylosis, resulting in areas of central canal and
bilateral neural foraminal narrowing.

Skull base intact. Maintenance of vertebral body height.
Straightening of expected cervical lordosis. Facets are
well-aligned. Prominent posterior osteophytes at C5-6. C1-2
demonstrates only degenerative change on coronal reformatted
images..
IMPRESSION: 1.  No acute intracranial abnormality.
2.  Cerebral atrophy and small vessel ischemic change.
3. Sinus disease.
4. No cervical spine acute fracture or subluxation; advanced
spondylosis.
5. Straightening of expected cervical lordosis could be positional,
due to muscular spasm, or ligamentous injury.
